# Patient Record
Sex: Female | Born: 1940 | Race: Black or African American | Hispanic: No | Marital: Single | Smoking: Never smoker
Health system: Southern US, Community
[De-identification: ages and names within clinical notes are randomized; demographics above are authoritative.]

## PROBLEM LIST (undated history)

## (undated) DIAGNOSIS — M1990 Unspecified osteoarthritis, unspecified site: Secondary | ICD-10-CM

## (undated) DIAGNOSIS — I499 Cardiac arrhythmia, unspecified: Secondary | ICD-10-CM

## (undated) DIAGNOSIS — I10 Essential (primary) hypertension: Secondary | ICD-10-CM

## (undated) DIAGNOSIS — Z8679 Personal history of other diseases of the circulatory system: Secondary | ICD-10-CM

## (undated) DIAGNOSIS — K219 Gastro-esophageal reflux disease without esophagitis: Secondary | ICD-10-CM

## (undated) HISTORY — PX: APPENDECTOMY: SHX54

## (undated) HISTORY — PX: CYST REMOVAL NECK: SHX6281

## (undated) HISTORY — PX: ABDOMINAL HYSTERECTOMY: SHX81

## (undated) HISTORY — PX: CHOLECYSTECTOMY: SHX55

## (undated) HISTORY — PX: CATARACT EXTRACTION W/ INTRAOCULAR LENS IMPLANT: SHX1309

## (undated) HISTORY — PX: BREAST SURGERY: SHX581

---

## 2019-03-23 ENCOUNTER — Encounter: Payer: Self-pay | Admitting: Cardiovascular Disease

## 2019-03-23 ENCOUNTER — Ambulatory Visit (INDEPENDENT_AMBULATORY_CARE_PROVIDER_SITE_OTHER): Payer: Medicare Other | Admitting: Cardiovascular Disease

## 2019-03-23 ENCOUNTER — Other Ambulatory Visit: Payer: Self-pay

## 2019-03-23 VITALS — BP 170/84 | HR 61 | Ht 68.0 in | Wt 171.4 lb

## 2019-03-23 DIAGNOSIS — I10 Essential (primary) hypertension: Secondary | ICD-10-CM | POA: Diagnosis not present

## 2019-03-23 DIAGNOSIS — Z7901 Long term (current) use of anticoagulants: Secondary | ICD-10-CM

## 2019-03-23 DIAGNOSIS — I4892 Unspecified atrial flutter: Secondary | ICD-10-CM

## 2019-03-23 NOTE — Patient Instructions (Signed)
Medication Instructions:  The current medical regimen is effective;  continue present plan and medications as directed. Please refer to the Current Medication list given to you today. If you need a refill on your cardiac medications before your next appointment, please call your pharmacy.  Testing/Procedures: Echocardiogram - Your physician has requested that you have an echocardiogram. Echocardiography is a painless test that uses sound waves to create images of your heart. It provides your doctor with information about the size and shape of your heart and how well your heart's chambers and valves are working. This procedure takes approximately one hour. There are no restrictions for this procedure. This will be performed at our [facility] location - [address].  Your physician has recommended that you wear a [place] monitor. The Zio patch cardiac monitor continuously records heart rhythm data for up to 14 days, this is for patients being evaluated for multiple types heart rhythms. For the first 24 hours post application, please avoid getting the Zio monitor wet in the shower or by excessive sweating during exercise. After that, feel free to carry on with regular activities. Keep soaps and lotions away from the ZIO XT Patch.  This will be placed at our [facility] location - [address].       Follow-Up: You will need a follow up appointment in 1 months.  You may see No primary care provider on file. or one of the following Advanced Practice Providers on your designated Care Team: Almyra Deforest, [place] . Fabian Sharp, PA-C     At [facility], you and your health needs are our priority.  As part of our continuing mission to provide you with exceptional heart care, we have created designated Provider Care Teams.  These Care Teams include your primary Cardiologist (physician) and Advanced Practice Providers (APPs -  Physician Assistants and Nurse Practitioners) who  all work together to provide you with the care you need, when you need it.  Thank you for choosing [facility] at [facility]

## 2019-03-23 NOTE — Progress Notes (Signed)
Cardiology Office Note    Date:  03/25/2019   ID:  Mandy Castillo, DOB 07/11/1941, MRN [id number]  PCP:  System, Pcp Not In  Cardiologist:  Nicki Guadalajarahomas Brayon Bielefeld, MD   New cardiology evaluation referred through the courtesy of Dr. Ree KidaJack Summer in [place]  History of Present Illness:  Mandy Castillo is a 78 y.o. 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  She resides in [place] but also has been living in [place] with her female partner.  She remains fairly active.  Earlier in the week while in [place] she presented to her primary physician, Dr. Ree KidaJack Summer for her annual physical.  She had missed her physical 1 year ago.  At the time she admits that she had been walking just prior to the office visit but denied any awareness of any heart rate irregularity or shortness of breath or chest pain.  An ECG done in Dr. Levada SchillingSummers office raise the possibility of possible atrial flutter with 3-1 block although I am not certain if this was definitive with diminutive abnormalities noted on ECG.  However, she was started on Eliquis 5 mg twice a day as well as metoprolol tartrate 25 mg twice a day.  She returned to [place] since this is where her significant other lives that she presents to the office today for cardiology evaluation. Presently she feels well.  She is unaware of any palpitations.  She was noted to have hypertension emergency room in [place] earlier in the week and she is now on metoprolol tartrate 25 mg twice a day in addition to Eliquis 5 mg twice a day.  Laboratory from her evaluation on March 20, 2019 showed a BUN of 10 creatinine 0.85, potassium 4.6.  She had normal LFTs.  TSH was 3.36 and free T4 1.35.  Vitamin B12 was 1524 vitamin D was 53.5.  Hemoglobin hematocrit were 13.2 and 41.0 respectively.  Lipid studies revealed a total cholesterol of 236 with significantly increased HDL cholesterol at 131, triglycerides of 49, and LDL cholesterol of 95.  She presents for  cardiology evaluation.  History reviewed. No pertinent past medical history.  Past medical history is notable for a a hysterectomy in 1936 and an appendectomy 1948.  Current Medications: Eliquis 5 mg twice a day and metoprolol succinate 25 mg daily  Allergies:   Patient has no allergy information on record.   Social History   Socioeconomic History   Marital status: Single    Spouse name: Not on file   Number of children: Not on file   Years of education: Not on file   Highest education level: Not on file  Occupational History   Not on file  Social Needs   Financial resource strain: Not on file   Food insecurity    Worry: Not on file    Inability: Not on file   Transportation needs    Medical: Not on file    Non-medical: Not on file  Tobacco Use   Smoking status: Never Smoker   Smokeless tobacco: Never Used  Substance and Sexual Activity   Alcohol use: Not on file   Drug use: Not on file   Sexual activity: Not on file  Lifestyle   Physical activity    Days per week: Not on file    Minutes per session: Not on file   Stress: Not on file  Relationships   Social connections    Talks on phone: Not on file  Gets together: Not on file    Attends religious service: Not on file    Active member of club or organization: Not on file    Attends meetings of clubs or organizations: Not on file    Relationship status: Not on file  Other Topics Concern   Not on file  Social History Narrative   Not on file    Socially she is retired from [employer].  She reached the rank of clinical.  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  She also attended classes at [employer] while in the [employer].  There is no tobacco use.  She drinks 2 glasses of wine per week.  She exercises regularly and does calisthenics on a daily basis  Family History: Her parents are deceased.  Her mother died at age 48 and  had a history of a CVA.  Her father died at age 17 and had prostate cancer as well as lung cancer.   ROS General: Negative; No fevers, chills, or night sweats;  HEENT: Negative; No changes in vision or hearing, sinus congestion, difficulty swallowing Pulmonary: Negative; No cough, wheezing, shortness of breath, hemoptysis Cardiovascular: Negative; No chest pain, presyncope, syncope, palpitations GI: Negative; No nausea, vomiting, diarrhea, or abdominal pain GU: Negative; No dysuria, hematuria, or difficulty voiding Musculoskeletal: Negative; no myalgias, joint pain, or weakness Hematologic/Oncology: Negative; no easy bruising, bleeding Endocrine: Negative; no heat/cold intolerance; no diabetes Neuro: Negative; no changes in balance, headaches Skin: Negative; No rashes or skin lesions Psychiatric: Negative; No behavioral problems, depression Sleep: Negative; No snoring, daytime sleepiness, hypersomnolence, bruxism, restless legs, hypnogognic hallucinations, no cataplexy Other comprehensive 14 point system review is negative.   PHYSICAL EXAM:   VS:  BP (!) 170/84    Pulse 61    Ht 5\' 8"  (1.727 m)    Wt 171 lb 6.4 oz (77.7 kg)    SpO2 100%    BMI 26.06 kg/m     Repeat blood pressure by me was significantly improved at 136/80 supine and 132/80 standing.  Wt Readings from Last 3 Encounters:  03/23/19 171 lb 6.4 oz (77.7 kg)    General: Alert, oriented, no distress.  Skin: normal turgor, no rashes, warm and dry HEENT: Normocephalic, atraumatic. Pupils equal round and reactive to light; sclera anicteric; extraocular muscles intact; Fundi without hemorrhages or exudates.  Discs flat Nose without nasal septal hypertrophy Mouth/Parynx benign; Mallinpatti scale 2 Neck: No JVD, no carotid bruits; normal carotid upstroke Lungs: clear to ausculatation and percussion; no wheezing or rales Chest wall: without tenderness to palpitation Heart: PMI not displaced, RRR, s1 s2 normal, 1/6 systolic  murmur, no diastolic murmur, no rubs, gallops, thrills, or heaves Abdomen: soft, nontender; no hepatosplenomehaly, BS+; abdominal aorta nontender and not dilated by palpation. Back: no CVA tenderness Pulses 2+ Musculoskeletal: full range of motion, normal strength, no joint deformities Extremities: no clubbing cyanosis or edema, Homan's sign negative  Neurologic: grossly nonfocal; Cranial nerves grossly wnl Psychologic: Normal mood and affect   Studies/Labs Reviewed:   EKG:  EKG is ordered today.  ECG (independently read by me): Normal sinus rhythm with an isolated PVC.  Ventricular rate 61 bpm.  PR interval 164 ms, QTc interval 418 ms.  Recent Labs: I reviewed the laboratory from Dr. Corinna Lines at [address] [place] [address].  Phone [phone], fax number [phone]  Glucose 78, potassium 4.6.  BUN 10 creatinine 0.85.  LFTs  normal.  TSH 3.36, free T4 1.35, B12 1524, hemoglobin 13.2, hematocrit 41.0.  Platelets 304 thousand.  Total cholesterol 236, triglycerides 49, HDL 131, LDL 95  No flowsheet data found.   No flowsheet data found.  No flowsheet data found. No results found for: MCV No results found for: TSH No results found for: HGBA1C   BNP No results found for: BNP  ProBNP No results found for: PROBNP   Lipid Panel  No results found for: CHOL, TRIG, HDL, CHOLHDL, VLDL, LDLCALC, LDLDIRECT   RADIOLOGY: No results found.   Additional studies/ records that were reviewed today include:  I reviewed the recent evaluation with Dr. Otelia Santee, MD from [place]  ASSESSMENT:    1. Atrial flutter, unspecified type (HCC)   2. Essential hypertension   3. Anticoagulated      PLAN:  Ms. Mandy Castillo is a young appearing 78 year old African-American female who is retired Midwife in the [employer].  She currently has been residing predominantly in [place] but has a significant other in [place] and has been commuting back and forth.  She  recently presented for routine annual exam and there was concern for possible atrial flutter flutter with 3 1 block.  I reviewed the ECGs.  The P waves are diminutive and in several leads it appears that the patient is in sinus rhythm but particularly in lead V4 there is a suggestion of a possible additional P wave on the downslope of T wave.  However, presently the patient is in definite sinus rhythm after initiating metoprolol extended release 25 mg.  She is completely asymptomatic and was unaware of any abnormality.  She was initially started on Eliquis.  At present I will continue this therapy but will schedule her for a 2-week monitor for further evaluation of her cardiac rhythm.  I am scheduling her for 2D echo Doppler study.  She denies any episodes of chest pain or shortness of breath.  Although her blood pressure several days ago was elevated on repeat by me was improved and minimally increased based on most recent hypertensive guidelines with stage I hypertension commences at 130/80.  I will see her in follow-up evaluation of the above studies and further recommendations will be made at that time.   Medication Adjustments/Labs and Tests Ordered: Current medicines are reviewed at length with the patient today.  Concerns regarding medicines are outlined above.  Medication changes, Labs and Tests ordered today are listed in the Patient Instructions below. Patient Instructions  Medication Instructions:  The current medical regimen is effective;  continue present plan and medications as directed. Please refer to the Current Medication list given to you today. If you need a refill on your cardiac medications before your next appointment, please call your pharmacy.  Testing/Procedures: Echocardiogram - Your physician has requested that you have an echocardiogram. Echocardiography is a painless test that uses sound waves to create images of your heart. It provides your doctor with information about the  size and shape of your heart and how well your hearts chambers and valves are working. This procedure takes approximately one hour. There are no restrictions for this procedure. This will be performed at our [facility] location - [address], [address].  Your physician has recommended that you wear a 14 DAY DAY ZIO-PATCH monitor. The Zio patch cardiac monitor continuously records heart rhythm data for up to 14 days, this is for patients being evaluated for multiple types heart rhythms. For the first  24 hours post application, please avoid getting the Zio monitor wet in the shower or by excessive sweating during exercise. After that, feel free to carry on with regular activities. Keep soaps and lotions away from the ZIO XT Patch.  This will be placed at our [facility] location - [address].       Follow-Up: You will need a follow up appointment in 1 months.  You may see No primary care provider on file. or one of the following Advanced Practice Providers on your designated Care Team: [place]e CourseHao Meng, [place]PA-C  Micah FlesherAngela Duke, PA-C     At [facility], you and your health needs are our priority.  As part of our continuing mission to provide you with exceptional heart care, we have created designated Provider Care Teams.  These Care Teams include your primary Cardiologist (physician) and Advanced Practice Providers (APPs -  Physician Assistants and Nurse Practitioners) who all work together to provide you with the care you need, when you need it.  Thank you for choosing [facility] at [facility]        Signed, Nicki Guadalajarahomas Aaiden Depoy, MD  03/25/2019 10:06 PM    [facility] [address], [address], [place], [place]  [id number] Phone: [phone]

## 2019-03-25 ENCOUNTER — Encounter: Payer: Self-pay | Admitting: Cardiovascular Disease

## 2019-03-26 ENCOUNTER — Telehealth: Payer: Self-pay | Admitting: *Deleted

## 2019-03-26 NOTE — Telephone Encounter (Signed)
14 ZIO AT long term live telemetry monitor to be mailed to patients home.  Instructins reviewed briefly as they are included in the monitor kit.

## 2019-03-28 ENCOUNTER — Ambulatory Visit (HOSPITAL_COMMUNITY): Payer: Medicare Other | Attending: Cardiovascular Disease

## 2019-03-28 ENCOUNTER — Other Ambulatory Visit: Payer: Self-pay

## 2019-03-28 DIAGNOSIS — I4892 Unspecified atrial flutter: Secondary | ICD-10-CM | POA: Diagnosis not present

## 2019-03-29 ENCOUNTER — Ambulatory Visit (INDEPENDENT_AMBULATORY_CARE_PROVIDER_SITE_OTHER): Payer: Medicare Other

## 2019-03-29 DIAGNOSIS — I4892 Unspecified atrial flutter: Secondary | ICD-10-CM

## 2019-04-04 ENCOUNTER — Telehealth: Payer: Self-pay | Admitting: Cardiovascular Disease

## 2019-04-04 NOTE — Telephone Encounter (Signed)
New Message    Courtney Paris is calling to advise that the patient has gotten there welcome call and they received the monitor as well. But it has been 72 hours and the device is not activated.

## 2019-04-04 NOTE — Telephone Encounter (Signed)
LEFT MESSAGE TO CALL BACK - WANTING TO KNOW IF PATIENT HAS  RECEIVED MONITOR  AND HAS NOT ACTIVATED THE SERVICE, YET

## 2019-04-06 NOTE — Telephone Encounter (Signed)
Spoke to pt about Zio monitor and educated about how to tell whether it was turned on correctly and to make sure that she see's no orange flashing lights currently. Pt then explained she had placed the monitor on  correctly and see's no flashing lights. Informed the pt that we were going to call the [facility] and discuss further. Reached out to [facility] and received instructions as to how help the client. Relayed these instructions to client. The pt's monitor is now connected. Patient confirmed the green light.

## 2019-04-06 NOTE — Telephone Encounter (Signed)
Follow up  ° ° °Patient is returning call.  °

## 2019-04-19 ENCOUNTER — Other Ambulatory Visit: Payer: Self-pay

## 2019-04-23 ENCOUNTER — Ambulatory Visit (INDEPENDENT_AMBULATORY_CARE_PROVIDER_SITE_OTHER): Payer: Medicare Other | Admitting: Cardiovascular Disease

## 2019-04-23 ENCOUNTER — Other Ambulatory Visit: Payer: Self-pay

## 2019-04-23 VITALS — BP 134/80 | HR 78 | Ht 68.0 in | Wt 170.0 lb

## 2019-04-23 DIAGNOSIS — Z7901 Long term (current) use of anticoagulants: Secondary | ICD-10-CM | POA: Diagnosis not present

## 2019-04-23 DIAGNOSIS — I10 Essential (primary) hypertension: Secondary | ICD-10-CM

## 2019-04-23 DIAGNOSIS — I471 Supraventricular tachycardia: Secondary | ICD-10-CM | POA: Diagnosis not present

## 2019-04-23 DIAGNOSIS — I4892 Unspecified atrial flutter: Secondary | ICD-10-CM

## 2019-04-23 MED ORDER — METOPROLOL SUCCINATE ER 50 MG PO TB24: 50.0000 mg | ORAL_TABLET | Freq: Every day | ORAL | 3 refills | Status: DC

## 2019-04-23 MED ORDER — ASPIRIN EC 81 MG PO TBEC: 81.0000 mg | DELAYED_RELEASE_TABLET | Freq: Every day | ORAL | 3 refills | Status: DC

## 2019-04-23 NOTE — Progress Notes (Signed)
Cardiology Office Note    Date:  04/29/2019   ID:  Mandy Castillo, DOB Nov 18, 1940, MRN [id number]  PCP:  System, Pcp Not In  Cardiologist:  Mandy Guadalajara, MD   F/U cardiology evaluation initally referred through the courtesy of Dr.Jack Castillo in [place]  History of Present Illness:  Mandy Castillo is a 78 y.o. female who presents for a 4-week follow-up cardiology evaluation.  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  She resides in [place] but also has been living in [place] with her female partner.  She remains fairly active.  Earlier in the week while in [place] she presented to her primary physician, Dr. Ree Kida Castillo for her annual physical.  She had missed her physical 1 year ago.  At the time she admits that she had been walking just prior to the office visit but denied any awareness of any heart rate irregularity or shortness of breath or chest pain.  An ECG done in Dr. Levada Castillo office raise the possibility of possible atrial flutter with 3-1 block although I am not certain if this was definitive with diminutive abnormalities noted on ECG.  However, she was started on Eliquis 5 mg twice a day as well as metoprolol tartrate 25 mg twice a day.  She returned to [place] since this is where her significant other lives and I saw her for initial cardiology evaluation on March 23, 2019.    When I saw her, she felt well.  She was unaware of any palpitations. She was noted to have hypertension emergency room in [place] earlier in the week and she was now on metoprolol tartrate 25 mg twice a day in addition to Eliquis 5 mg twice a day.  Laboratory from her evaluation on March 20, 2019 showed a BUN of 10 creatinine 0.85, potassium 4.6.  She had normal LFTs.  TSH was 3.36 and free T4 1.35.  Vitamin B12 was 1524 vitamin D was 53.5.  Hemoglobin hematocrit were 13.2 and 41.0 respectively.  Lipid studies revealed a total cholesterol of 236 with significantly increased  HDL cholesterol at 131, triglycerides of 49, and LDL cholesterol of 95.    During her evaluation I reviewed the ECG from [place] where the P waves were exceedingly diminutive and in several leads it appeared that the patient was in sinus rhythm with the exception of a possible abnormality in V4 suggesting an additional P wave on the downslope of the T wave.  Her ECG during her evaluation with me showed sinus rhythm.  She remained completely asymptomatic.  I recommended that she undergo a 2D echo Doppler study which was done on March 28, 2019.  This showed excellent systolic function with EF of 60 to 65%.  There was mild grade 1 diastolic dysfunction.  The left atrium and right atrium were normal in size.  She had mild MR.  A trivial pericardial effusion was identified.  She subsequently wore a 2-week ZIO patch monitor.  This revealed predominant sinus rhythm with an average rate at 67 bpm.  She had very short-lived episodes of SVT with the fastest being 7 beats with a rate of 160 bpm.  There were no episodes of atrial fibrillation or atrial flutter.  There were isolated PACs, and PVCs.  No high-grade ectopy was demonstrated.  Presently she continues to feel well and remains asymptomatic.  She presents for reevaluation.   Past medical history is notable for a a hysterectomy in 1936 and an  appendectomy 1948.  Current Medications: Eliquis 5 mg twice a day and metoprolol succinate 25 mg daily  Allergies:   Patient has no allergy information on record.   Social History   Socioeconomic History  . Marital status: Single    Spouse name: Not on file  . Number of children: Not on file  . Years of education: Not on file  . Highest education level: Not on file  Occupational History  . Not on file  Social Needs  . Financial resource strain: Not on file  . Food insecurity    Worry: Not on file    Inability: Not on file  . Transportation needs    Medical: Not on file    Non-medical: Not on file   Tobacco Use  . Smoking status: Never Smoker  . Smokeless tobacco: Never Used  Substance and Sexual Activity  . Alcohol use: Not on file  . Drug use: Not on file  . Sexual activity: Not on file  Lifestyle  . Physical activity    Days per week: Not on file    Minutes per session: Not on file  . Stress: Not on file  Relationships  . Social Herbalist on phone: Not on file    Gets together: Not on file    Attends religious service: Not on file    Active member of club or organization: Not on file    Attends meetings of clubs or organizations: Not on file    Relationship status: Not on file  Other Topics Concern  . Not on file  Social History Narrative  . Not on file    Socially she is retired from [employer].  She reached the rank of clinical.  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  She also attended classes at [employer] while in the [employer].  There is no tobacco use.  She drinks 2 glasses of wine per week.  She exercises regularly and does calisthenics on a daily basis  Family History: Her parents are deceased.  Her mother died at age 54 and had a history of a CVA.  Her father died at age 53 and had prostate cancer as well as lung cancer.   ROS General: Negative; No fevers, chills, or night sweats;  HEENT: Negative; No changes in vision or hearing, sinus congestion, difficulty swallowing Pulmonary: Negative; No cough, wheezing, shortness of breath, hemoptysis Cardiovascular: Negative; No chest pain, presyncope, syncope, palpitations GI: Negative; No nausea, vomiting, diarrhea, or abdominal pain GU: Negative; No dysuria, hematuria, or difficulty voiding Musculoskeletal: Negative; no myalgias, joint pain, or weakness Hematologic/Oncology: Negative; no easy bruising, bleeding Endocrine: Negative; no heat/cold intolerance; no diabetes Neuro: Negative; no changes in balance, headaches Skin:  Negative; No rashes or skin lesions Psychiatric: Negative; No behavioral problems, depression Sleep: Negative; No snoring, daytime sleepiness, hypersomnolence, bruxism, restless legs, hypnogognic hallucinations, no cataplexy Other comprehensive 14 point system review is negative.   PHYSICAL EXAM:   VS:  BP 134/80   Pulse 78   Ht 5\' 8"  (1.727 m)   Wt 170 lb (77.1 kg)   BMI 25.85 kg/m     Repeat blood pressure by me was 140/80  Wt Readings from Last 3 Encounters:  04/23/19 170 lb (77.1 kg)  03/23/19 171 lb 6.4 oz (77.7 kg)    General: Alert, oriented, no distress.  Skin: normal turgor, no rashes, warm and dry HEENT: Normocephalic, atraumatic.  Pupils equal round and reactive to light; sclera anicteric; extraocular muscles intact; Nose without nasal septal hypertrophy Mouth/Parynx benign; Mallinpatti scale 2 Neck: No JVD, no carotid bruits; normal carotid upstroke Lungs: clear to ausculatation and percussion; no wheezing or rales Chest wall: without tenderness to palpitation Heart: PMI not displaced, RRR, s1 s2 normal, 1/6 systolic murmur, no diastolic murmur, no rubs, gallops, thrills, or heaves Abdomen: soft, nontender; no hepatosplenomehaly, BS+; abdominal aorta nontender and not dilated by palpation. Back: no CVA tenderness Pulses 2+ Musculoskeletal: full range of motion, normal strength, no joint deformities Extremities: no clubbing cyanosis or edema, Homan's sign negative  Neurologic: grossly nonfocal; Cranial nerves grossly wnl Psychologic: Normal mood and affect    Studies/Labs Reviewed:   EKG:  EKG is ordered today.  NSR at 78;no ectopy, normal intervals.  August 28, 2020ECG (independently read by me): Normal sinus rhythm with an isolated PVC.  Ventricular rate 61 bpm.  PR interval 164 ms, QTc interval 418 ms.  Recent Labs: I reviewed the laboratory from Dr. Vaughan Basta at [address] in [place], [place] [address].  Phone #[phone], fax number [phone]   Glucose 78, potassium 4.6.  BUN 10 creatinine 0.85.  LFTs normal.  TSH 3.36, free T4 1.35, B12 1524, hemoglobin 13.2, hematocrit 41.0.  Platelets 304 thousand.  Total cholesterol 236, triglycerides 49, HDL 131, LDL 95  No flowsheet data found.   No flowsheet data found.  No flowsheet data found. No results found for: MCV No results found for: TSH No results found for: HGBA1C   BNP No results found for: BNP  ProBNP No results found for: PROBNP   Lipid Panel  No results found for: CHOL, TRIG, HDL, CHOLHDL, VLDL, LDLCALC, LDLDIRECT   RADIOLOGY: No results found.   Additional studies/ records that were reviewed today include:  I reviewed the recent evaluation with Dr. Otelia Santee, MD from [facility] [place]  ECHO 03/28/2019 IMPRESSIONS  1. The left ventricle has normal systolic function with an ejection fraction of 60-65%. The cavity size was normal. There is mildly increased left ventricular wall thickness. Left ventricular diastolic Doppler parameters are consistent with impaired  relaxation. No evidence of left ventricular regional wall motion abnormalities.  2. The right ventricle has normal systolic function. The cavity was normal. There is no increase in right ventricular wall thickness.  3. The pericardial effusion is circumferential.  4. Trivial pericardial effusion is present.  5. The mitral valve is grossly normal. Mild thickening of the mitral valve leaflet.  6. The tricuspid valve is grossly normal.  7. The aortic valve is tricuspid. Mild thickening of the aortic valve. No stenosis of the aortic valve.  8. The aorta is normal unless otherwise noted.  9. The aortic root is normal in size and structure. 10. When compared to the prior study: No prior study.  ------------------------------------------------------------------------------------------------------------- ZIO MONITOR The patient was monitored with a Zio patch from March 29, 2019 through April 12, 2019.   The predominant heart rhythm was sinus rhythm with an average rate at 67 bpm.  The slowest heart rate was 48 bpm which occurred on September 13 at 5:47 AM.  The fastest heart rate was 160 bpm which occurred at 12:01 AM on September 6.  The patient had several short runs of SVT with the fastest interval lasting 7 beats with a maximum rate at 160 bpm.  The longest interval was 25.1 seconds with an average rate at 97 bpm.  There were rare isolated PACs, rare atrial couplets.  There was no episode of atrial fibrillation or atrial flutter.  Isolated PVCs were very rare at less than 1%.  There were very rare episodes of ventricular bigeminy and trigeminy.  ASSESSMENT:    1. Essential hypertension   2. Paroxysmal SVT (supraventricular tachycardia) (HCC)   3. 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  She currently has been residing predominantly in [place] but has a significant other in [place] and has been commuting back and forth.  She recently presented for routine annual exam with her primary physician in [place] and there was concern for possible atrial flutter flutter with 3 1 block.  I reviewed the ECGs.  The P waves are diminutive and in several leads it appears that the patient was in sinus rhythm but  in lead V4 there was a suggestion of a possible additional P wave on the downslope of T wave.  She was started on Eliquis and metoprolol 25 mg.  When I saw her for initial evaluation she was definitely in sinus rhythm.  She was asymptomatic at the time of her initial assessment in [place].  Since I last saw her, she has felt well.  She is unaware of any significant rhythm issues.  I reviewed her echo Doppler study which confirms normal systolic function with mild grade 1 diastolic dysfunction.  Both right and left atrium were of normal dimensions.  I reviewed her ZIO patch  monitor which showed predominant sinus rhythm averaging at 67 bpm.  However, they were several short bursts of possible SVT with a maximum of 7 beats at a rate of 160 bpm.  The longest duration was 25 seconds at a rate of 97.  She had rare ectopy.  I had a long discussion with her today.  I am not totally convinced that she had developed atrial flutter.  Presently, I have recommended she discontinue Eliquis and initiate baby aspirin 81 mg.  I am increasing her metoprolol succinate from her present dose of 25 mg in the next week she will increase this to 37.5 mg.  1 week later she will further titrate this to 50 mg daily.  She will monitor her resting pulse and blood pressure.  I will see her in 3 to 4 months for follow-up evaluation.  If she becomes aware of any recurrent palpitation she is to contact our office for a sooner evaluation.   Medication Adjustments/Labs and Tests Ordered: Current medicines are reviewed at length with the patient today.  Concerns regarding medicines are outlined above.  Medication changes, Labs and Tests ordered today are listed in the Patient Instructions below. Patient Instructions  Medication Instructions:   CHANGES TO   METOPROLOL SUCCINATE  37.5 MG  -  FOR ONE WEEK - TABLET  1 AND 1/2  DAILY ---  IF TOLERATE  INCREASE  TO 50 MG  ( 2 TABLET  OF 25 MG)   - STOP TAKING ELIQUIS  START ASPIRIN 81 MG  - ENTERIC COATED   DAILY    If you need a refill on your cardiac medications before your next appointment, please call your pharmacy.   Lab work: NOT NEEDED   Testing/Procedures: NOT NEEDED  Follow-Up: At [facility], you and your health needs are our priority.  As part of our continuing mission to provide you with exceptional heart care, we have created designated Provider Care Teams.  These Care Teams include your primary Cardiologist (physician) and Advanced Practice Providers (APPs -  Physician Assistants and Nurse Practitioners) who all work together to  provide you with the care you need, when you need it. . Your physician recommends that you schedule a follow-up appointment in 3 TO 4 MONTHS  .   Any Other Special Instructions Will Be Listed Below (If Applicable).      Signed, Mandy Guadalajara, MD  04/29/2019 10:22 AM    [facility] [address], [address], [place], [place]  [address] Phone: [phone]

## 2019-04-23 NOTE — Patient Instructions (Addendum)
Medication Instructions:   CHANGES TO   METOPROLOL SUCCINATE  37.5 MG  -  FOR ONE WEEK - TABLET  1 AND 1/2  DAILY ---  IF TOLERATE  INCREASE  TO 50 MG  ( 2 TABLET  OF 25 MG)   - STOP TAKING ELIQUIS  START ASPIRIN 81 MG  - ENTERIC COATED   DAILY    If you need a refill on your cardiac medications before your next appointment, please call your pharmacy.   Lab work: NOT NEEDED   Testing/Procedures: NOT NEEDED  Follow-Up: At [facility], you and your health needs are our priority.  As part of our continuing mission to provide you with exceptional heart care, we have created designated Provider Care Teams.  These Care Teams include your primary Cardiologist (physician) and Advanced Practice Providers (APPs -  Physician Assistants and Nurse Practitioners) who all work together to provide you with the care you need, when you need it. . Your physician recommends that you schedule a follow-up appointment in 3 TO 4 MONTHS  .   Any Other Special Instructions Will Be Listed Below (If Applicable).

## 2019-04-25 ENCOUNTER — Other Ambulatory Visit (INDEPENDENT_AMBULATORY_CARE_PROVIDER_SITE_OTHER): Payer: Medicare Other

## 2019-04-25 DIAGNOSIS — I10 Essential (primary) hypertension: Secondary | ICD-10-CM

## 2019-04-25 DIAGNOSIS — I4892 Unspecified atrial flutter: Secondary | ICD-10-CM

## 2019-04-25 DIAGNOSIS — Z7901 Long term (current) use of anticoagulants: Secondary | ICD-10-CM

## 2019-04-29 ENCOUNTER — Encounter: Payer: Self-pay | Admitting: Cardiovascular Disease

## 2019-09-21 ENCOUNTER — Other Ambulatory Visit: Payer: Self-pay | Admitting: Physician Assistant

## 2019-09-21 ENCOUNTER — Telehealth (INDEPENDENT_AMBULATORY_CARE_PROVIDER_SITE_OTHER): Payer: Medicare Other | Admitting: Physician Assistant

## 2019-09-21 ENCOUNTER — Encounter: Payer: Self-pay | Admitting: Physician Assistant

## 2019-09-21 VITALS — BP 138/78 | HR 70 | Ht 68.0 in | Wt 171.0 lb

## 2019-09-21 DIAGNOSIS — I471 Supraventricular tachycardia: Secondary | ICD-10-CM | POA: Diagnosis not present

## 2019-09-21 MED ORDER — METOPROLOL SUCCINATE ER 50 MG PO TB24: 50.0000 mg | ORAL_TABLET | Freq: Every day | ORAL | 3 refills | Status: DC

## 2019-09-21 NOTE — Progress Notes (Signed)
Virtual Visit via Telephone Note   This visit type was conducted due to national recommendations for restrictions regarding the COVID-19 Pandemic (e.g. social distancing) in an effort to limit this patient's exposure and mitigate transmission in our community.  Due to her co-morbid illnesses, this patient is at least at moderate risk for complications without adequate follow up.  This format is felt to be most appropriate for this patient at this time.  The patient did not have access to video technology/had technical difficulties with video requiring transitioning to audio format only (telephone).  All issues noted in this document were discussed and addressed.  No physical exam could be performed with this format.  Please refer to the patient's chart for her  consent to telehealth for [facility].   Date:  09/23/2019   ID:  Mandy Castillo, DOB Mar 11, 1941, MRN [id number]  Patient Location: Home Provider Location: Office  PCP:  System, Pcp Not In  Cardiologist:  Shelva Majestic, MD  Electrophysiologist:  None   Evaluation Performed:  Follow-Up Visit  Chief Complaint:  followup  History of Present Illness:    Mandy Castillo is a 79 y.o. 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  There was a EKG that was obtained by his primary care provider that showed possibility of atrial flutter with 3-1 AV block.  She was started on Eliquis 5 mg twice daily as well as metoprolol.  Since she resides in [place], her primary care provider from [place] referred her to Dr. Claiborne Billings for initial evaluation in August 2020.  Dr. Claiborne Billings has reviewed her EKG from [place], he felt the P wave was present however exceedingly diminutive making it hard to interpret.  He suspect the patient was in sinus rhythm.  Repeat EKG continue to show sinus rhythm.  Echocardiogram performed in September 2020 showed EF 60 to 65%, grade 1 DD, normal left and the right atrium size, mild MR, trivial pericardial effusion.  She  subsequently wore a 2-week Zio patch monitor that showed predominantly sinus rhythm with average heart rate of 67.  She only had a short-lived episode of SVT with the fastest being 7 beats with a heart rate of 160 bpm.  There was no atrial fibrillation, but there was PACs and PVCs.  During the last office visit on 04/15/2019, Dr. Claiborne Billings increased metoprolol succinate to 37.5 mg for 1 week with instruction to increase it further to 50 mg if she is able to tolerate.  Eliquis has been discontinued and she was started on 81 mg aspirin.  She is doing quite well from cardiology perspective.  She denies any recent exertional chest pain or shortness of breath.  She continues to be quite active at home.  She received her first [place] vaccine on February 11, her next dose of Pfizer vaccine is due on March 8th.  Overall she is doing quite well from cardiology perspective and denies any lower extremity edema, orthopnea or PND.  I would recommend continuing her current therapy and she can follow-up with Dr. Claiborne Billings in 1 year.  The patient does not have symptoms concerning for COVID-19 infection (fever, chills, cough, or new shortness of breath).    No past medical history on file. No past surgical history on file.   Current Meds  Medication Sig  . aspirin EC 81 MG tablet Take 1 tablet (81 mg total) by mouth daily.     Allergies:   Patient has no known allergies.   Social History  Tobacco Use  . Smoking status: Never Smoker  . Smokeless tobacco: Never Used  Substance Use Topics  . Alcohol use: Not on file  . Drug use: Not on file     Family Hx: The patient's family history is not on file.  ROS:   Please see the history of present illness.     All other systems reviewed and are negative.   Prior CV studies:   The following studies were reviewed today:  Echo 03/28/2019 IMPRESSIONS    1. The left ventricle has normal systolic function with an ejection  fraction of 60-65%. The cavity size was  normal. There is mildly increased  left ventricular wall thickness. Left ventricular diastolic Doppler  parameters are consistent with impaired  relaxation. No evidence of left ventricular regional wall motion  abnormalities.  2. The right ventricle has normal systolic function. The cavity was  normal. There is no increase in right ventricular wall thickness.  3. The pericardial effusion is circumferential.  4. Trivial pericardial effusion is present.  5. The mitral valve is grossly normal. Mild thickening of the mitral  valve leaflet.  6. The tricuspid valve is grossly normal.  7. The aortic valve is tricuspid. Mild thickening of the aortic valve. No  stenosis of the aortic valve.  8. The aorta is normal unless otherwise noted.  9. The aortic root is normal in size and structure.  10. When compared to the prior study: No prior study.   Labs/Other Tests and Data Reviewed:    EKG:  An ECG dated 04/23/2019 was personally reviewed today and demonstrated:  Normal sinus rhythm without significant ST-T wave changes.  Recent Labs: No results found for requested labs within last 8760 hours.   Recent Lipid Panel No results found for: CHOL, TRIG, HDL, CHOLHDL, LDLCALC, LDLDIRECT  Wt Readings from Last 3 Encounters:  09/21/19 171 lb (77.6 kg)  04/23/19 170 lb (77.1 kg)  03/23/19 171 lb 6.4 oz (77.7 kg)     Objective:    Vital Signs:  BP 138/78   Pulse 70   Ht 5\' 8"  (1.727 m)   Wt 171 lb (77.6 kg)   BMI 26.00 kg/m    VITAL SIGNS:  reviewed  ASSESSMENT & PLAN:    1. SVT: Symptom well controlled on the current metoprolol.  She denies any recent palpitation.  She continued to be quite active and without any exertional symptoms.  I will continue on the current therapy.  COVID-19 Education: The signs and symptoms of COVID-19 were discussed with the patient and how to seek care for testing (follow up with PCP or arrange E-visit).  The importance of social distancing was  discussed today.  Time:   Today, I have spent 5 minutes with the patient with telehealth technology discussing the above problems.     Medication Adjustments/Labs and Tests Ordered: Current medicines are reviewed at length with the patient today.  Concerns regarding medicines are outlined above.   Tests Ordered: No orders of the defined types were placed in this encounter.   Medication Changes: Meds ordered this encounter  Medications  . metoprolol succinate (TOPROL-XL) 50 MG 24 hr tablet    Sig: Take 1 tablet (50 mg total) by mouth daily. Take with or immediately following a meal.    Dispense:  90 tablet    Refill:  3    Follow Up:  Either In Person or Virtual in 1 year(s)  , PA  09/23/2019 11:47 PM  [facility]

## 2019-09-21 NOTE — Patient Instructions (Signed)
Medication Instructions:  Your physician recommends that you continue on your current medications as directed. Please refer to the Current Medication list given to you today.  *If you need a refill on your cardiac medications before your next appointment, please call your pharmacy*   Follow-Up: At [facility], you and your health needs are our priority.  As part of our continuing mission to provide you with exceptional heart care, we have created designated Provider Care Teams.  These Care Teams include your primary Cardiologist (physician) and Advanced Practice Providers (APPs -  Physician Assistants and Nurse Practitioners) who all work together to provide you with the care you need, when you need it.  We recommend signing up for the patient portal called "MyChart".  Sign up information is provided on this After Visit Summary.  MyChart is used to connect with patients for Virtual Visits (Telemedicine).  Patients are able to view lab/test results, encounter notes, upcoming appointments, etc.  Non-urgent messages can be sent to your provider as well.   To learn more about what you can do with MyChart, go to [email or web address].    Your next appointment:   12 month(s)  The format for your next appointment:   In Person  Provider:   Thomas Kelly, MD   Other Instructions   

## 2020-01-30 DIAGNOSIS — Z961 Presence of intraocular lens: Secondary | ICD-10-CM | POA: Diagnosis not present

## 2020-01-30 DIAGNOSIS — H2512 Age-related nuclear cataract, left eye: Secondary | ICD-10-CM | POA: Diagnosis not present

## 2020-04-21 ENCOUNTER — Other Ambulatory Visit: Payer: Self-pay

## 2020-04-21 ENCOUNTER — Ambulatory Visit (INDEPENDENT_AMBULATORY_CARE_PROVIDER_SITE_OTHER): Payer: Medicare Other | Admitting: Family Medicine

## 2020-04-21 ENCOUNTER — Encounter: Payer: Self-pay | Admitting: Family Medicine

## 2020-04-21 VITALS — BP 140/80 | HR 61 | Temp 97.7°F | Ht 67.5 in | Wt 179.2 lb

## 2020-04-21 DIAGNOSIS — I4892 Unspecified atrial flutter: Secondary | ICD-10-CM | POA: Insufficient documentation

## 2020-04-21 DIAGNOSIS — R221 Localized swelling, mass and lump, neck: Secondary | ICD-10-CM | POA: Diagnosis not present

## 2020-04-21 DIAGNOSIS — I10 Essential (primary) hypertension: Secondary | ICD-10-CM | POA: Diagnosis not present

## 2020-04-21 NOTE — Assessment & Plan Note (Signed)
Cont metoprolol. Slightly high today, but reports normal at home. Check kidney function today.

## 2020-04-21 NOTE — Progress Notes (Signed)
   Subjective:     Mandy Castillo is a 79 y.o. female presenting for Establish Care and Referral (dermatologist and colonoscopy )     HPI   #HTN - takes metoprolol daily - checks bp at home -    Review of Systems   Social History   Tobacco Use  Smoking Status Never Smoker  Smokeless Tobacco Never Used        Objective:    BP Readings from Last 3 Encounters:  04/21/20 140/80  09/21/19 138/78  04/23/19 134/80   Wt Readings from Last 3 Encounters:  04/21/20 179 lb 4 oz (81.3 kg)  09/21/19 171 lb (77.6 kg)  04/23/19 170 lb (77.1 kg)    BP 140/80   Pulse 61   Temp 97.7 F (36.5 C) (Temporal)   Ht 5' 7.5" (1.715 m)   Wt 179 lb 4 oz (81.3 kg)   SpO2 98%   BMI 27.66 kg/m    Physical Exam Constitutional:      General: She is not in acute distress.    Appearance: She is well-developed. She is not diaphoretic.  HENT:     Right Ear: External ear normal.     Left Ear: External ear normal.     Nose: Nose normal.  Eyes:     Conjunctiva/sclera: Conjunctivae normal.  Cardiovascular:     Rate and Rhythm: Normal rate and regular rhythm.     Heart sounds: Murmur heard.   Pulmonary:     Effort: Pulmonary effort is normal. No respiratory distress.     Breath sounds: Normal breath sounds. No wheezing.  Musculoskeletal:     Cervical back: Neck supple.  Skin:    General: Skin is warm and dry.     Capillary Refill: Capillary refill takes less than 2 seconds.     Comments: Large raised mass on the posterior neck. Non-fluctuant, red or tender. Soft, non-mobile   Neurological:     Mental Status: She is alert. Mental status is at baseline.  Psychiatric:        Mood and Affect: Mood normal.        Behavior: Behavior normal.           Assessment & Plan:   Problem List Items Addressed This Visit      Cardiovascular and Mediastinum   Atrial flutter (HCC)    Cont metoprolol 50 mg. Appreciate cardiology support      Essential hypertension    Cont  metoprolol. Slightly high today, but reports normal at home. Check kidney function today.       Relevant Orders   Comprehensive metabolic panel     Other   Neck mass - Primary    Referral to dermatology for evaluation and treatment. ?Lipoma or cyst      Relevant Orders   Ambulatory referral to Dermatology       Return in about 6 months (around 10/19/2020) for Medicare wellness visit.  Lynnda Child, MD  This visit occurred during the SARS-CoV-2 public health emergency.  Safety protocols were in place, including screening questions prior to the visit, additional usage of staff PPE, and extensive cleaning of exam room while observing appropriate contact time as indicated for disinfecting solutions.

## 2020-04-21 NOTE — Patient Instructions (Signed)
#  Referral I have placed a referral to a specialist for you. You should receive a phone call from the specialty office. Make sure your voicemail is not full and that if you are able to answer your phone to unknown or new numbers.   It may take up to 2 weeks to hear about the referral. If you do not hear anything in 2 weeks, please call our office and ask to speak with the referral coordinator.    No need for any cancer screening based on age  Return for medicare wellness visit or sooner as needed

## 2020-04-21 NOTE — Assessment & Plan Note (Signed)
Cont metoprolol 50 mg. Appreciate cardiology support

## 2020-04-21 NOTE — Assessment & Plan Note (Signed)
Referral to dermatology for evaluation and treatment. ?Lipoma or cyst

## 2020-04-22 LAB — COMPREHENSIVE METABOLIC PANEL
ALT: 13 U/L (ref 0–35)
AST: 16 U/L (ref 0–37)
Albumin: 4.3 g/dL (ref 3.5–5.2)
Alkaline Phosphatase: 68 U/L (ref 39–117)
BUN: 15 mg/dL (ref 6–23)
CO2: 31 mEq/L (ref 19–32)
Calcium: 9.6 mg/dL (ref 8.4–10.5)
Chloride: 101 mEq/L (ref 96–112)
Creatinine, Ser: 0.86 mg/dL (ref 0.40–1.20)
GFR: 76.49 mL/min (ref >60.00)
Glucose, Bld: 82 mg/dL (ref 70–99)
Potassium: 4 mEq/L (ref 3.5–5.1)
Sodium: 138 mEq/L (ref 135–145)
Total Bilirubin: 0.4 mg/dL (ref 0.2–1.2)
Total Protein: 7.3 g/dL (ref 6.0–8.3)

## 2020-07-09 ENCOUNTER — Ambulatory Visit (INDEPENDENT_AMBULATORY_CARE_PROVIDER_SITE_OTHER): Payer: Medicare Other | Admitting: Family Medicine

## 2020-07-09 ENCOUNTER — Telehealth: Payer: Self-pay

## 2020-07-09 ENCOUNTER — Encounter: Payer: Self-pay | Admitting: Family Medicine

## 2020-07-09 ENCOUNTER — Other Ambulatory Visit: Payer: Self-pay

## 2020-07-09 VITALS — BP 150/72 | HR 80 | Temp 96.9°F | Ht 67.5 in | Wt 175.4 lb

## 2020-07-09 DIAGNOSIS — I10 Essential (primary) hypertension: Secondary | ICD-10-CM

## 2020-07-09 DIAGNOSIS — M542 Cervicalgia: Secondary | ICD-10-CM | POA: Insufficient documentation

## 2020-07-09 NOTE — Assessment & Plan Note (Signed)
BP rose significantly when out of town after forgetting her metoprolol for 3 days  Also neck pain on L (suspect not related as it is positional) Started back on metoprolol last night and bp is starting to come down BP: (!) 150/72    Pulse Rate: 80  Plan to continue metoprolol xl 50 mg daily with better compliance  Monitor at home over the weekend and call in Monday with update  If not back to baseline may need to inc or add tx

## 2020-07-09 NOTE — Telephone Encounter (Signed)
I spoke with Mandy Castillo; Mandy Castillo said she takes her metoprolol at night time; pts last BP on 07/08/20 was 166/90. Mandy Castillo's BP this morning is 176/107 Mandy Castillo does not have CP, SOB, H/A or dizziness. Mandy Castillo does have lt neck pain for couple of days but Mandy Castillo thinks she slept wrong; neck pain is better today and hurts when she moves her neck. Mandy Castillo scheduled in office appt with Dr Milinda Antis 07/09/20 at 10:30 since BP is elevated; could be due to missing 2 days of meds but to be safe Mandy Castillo wants to be seen. No covid. UC & ED precautions given and Mandy Castillo voiced understanding.

## 2020-07-09 NOTE — Patient Instructions (Signed)
Continue your current medicine   Watch blood pressure over the weekend  Check when you are relaxed  Let us know Monday how readings are and we will see if we need to change or add or advance medication   Use some heat on your neck  Use a good pillow  Gently stretch your neck   If symptoms return or worsen please let me know

## 2020-07-09 NOTE — Telephone Encounter (Signed)
[facility] Day - Client TELEPHONE ADVICE RECORD [facility] Patient Name: Mandy Castillo Gender: Female DOB: 1941-07-13 Age: 79 Y 11 M 5 D Return Phone Number: [phone] (Primary), [phone] (Secondary) Address: City/State/Zip: [facility] [place] [address] Client [facility] Day - Client Client Site  [facility] [place] - Day Physician Gweneth Dimitri- MD Contact Type Call Who Is Calling Patient / Member / Family / Caregiver Call Type Triage / Clinical Relationship To Patient Self Return Phone Number [phone] (Primary) Chief Complaint Blood Pressure High Reason for Call Symptomatic / Request for Health Information Initial Comment Caller states she forgot to take her medication and her reading is high. It is 199/700. Translation No Nurse Assessment Nurse: Rinaldo Ratel, RN, [place] Date/Time Lamount Cohen Time): 07/08/2020 5:14:10 PM Confirm and document reason for call. If symptomatic, describe symptoms. ---Caller states she missed 2 days of taking her metoprolol due to her traveling. Pt states she has not had her dose today. Pt reports BP is 166/90. Denies chest pain, palpitations, sob. Does the patient have any new or worsening symptoms? ---Yes Will a triage be completed? ---Yes Related visit to physician within the last 2 weeks? ---No Does the PT have any chronic conditions? (i.e. diabetes, asthma, this includes High risk factors for pregnancy, etc.) ---Yes List chronic conditions. ---HTN Is this a behavioral health or substance abuse call? ---No Guidelines Guideline Title Affirmed Question Affirmed Notes Nurse Date/Time (Eastern Time) Blood Pressure - High Systolic BP >= 160 OR Diastolic >= 100 Rinaldo Ratel, RN, [place] 07/08/2020 5:16:37 PM Disp. Time Lamount Cohen Time) Disposition Final User 07/08/2020 5:19:44 PM SEE PCP WITHIN 3 DAYS Yes Rinaldo Ratel, RN, [place] Caller Disagree/Comply Comply Caller Understands  Yes PreDisposition Call Doctor PLEASE NOTE: All timestamps contained within this report are represented as [place] Standard Time. CONFIDENTIALTY NOTICE: This fax transmission is intended only for the addressee. It contains information that is legally privileged, confidential or otherwise protected from use or disclosure. If you are not the intended recipient, you are strictly prohibited from reviewing, disclosing, copying using or disseminating any of this information or taking any action in reliance on or regarding this information. If you have received this fax in error, please notify us immediately by telephone so that we can arrange for its return to [place]. Phone: [phone], Toll-Free: [phone], Fax: [phone] Page: 2 of 2 Call Id: [id number] Care Advice Given Per Guideline SEE PCP WITHIN 3 DAYS: * You need to be seen within 2 or 3 days. CALL BACK IF: * Difficulty walking, difficulty talking, or severe headache occurs * Weakness or numbness of the face, arm or leg on one side of the body occurs * Chest pain or difficulty breathing occurs * Your blood pressure is over 180/110 * You become worse CARE ADVICE given per High Blood Pressure (Adult) guideline. Referrals REFERRED TO PCP OFFICE

## 2020-07-09 NOTE — Progress Notes (Signed)
Subjective:    Patient ID: Mandy Castillo, female    DOB: 08-03-1940, 79 y.o.   MRN: [id number]  This visit occurred during the SARS-CoV-2 public health emergency.  Safety protocols were in place, including screening questions prior to the visit, additional usage of staff PPE, and extensive cleaning of exam room while observing appropriate contact time as indicated for disinfecting solutions.    HPI 79 yo pt of Dr Selena Batten presents for BP concern  Wt Readings from Last 3 Encounters:  07/09/20 175 lb 7 oz (79.6 kg)  04/21/20 179 lb 4 oz (81.3 kg)  09/21/19 171 lb (77.6 kg)   27.07 kg/m   Recently traveled and stayed at a friend's house  Woke up with pain in her L shoulder (hurt to move it)  It radiates to neck Thought she slept wrong   Last night still sore  Took her bp and it was elevated   She missed 2 nights of metoprolol  Now back on -last dose was last night   Her bp cuff is for the arm   She drove back home     H/o HTN Takes metoprolol xl 50 mg daily  BP Readings from Last 3 Encounters:  07/09/20 (!) 150/72  04/21/20 140/80  09/21/19 138/78    Pulse Readings from Last 3 Encounters:  07/09/20 80  04/21/20 61  09/21/19 70   Feels fine today  Shoulder/neck hurts to reach and tilt neck  Had a neck fusion years ago and rom is limited anyway  Patient Active Problem List   Diagnosis Date Noted  . Neck pain 07/09/2020  . Atrial flutter (HCC) 04/21/2020  . Essential hypertension 04/21/2020  . Neck mass 04/21/2020  . Nuclear sclerosis, left 01/30/2020  . 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 No pertinent past medical history. Past Surgical History:  Procedure Laterality Date  . ABDOMINAL HYSTERECTOMY    . APPENDECTOMY    . BREAST SURGERY     cyst removed  . CHOLECYSTECTOMY     Social History   Tobacco Use  . Smoking status: Never Smoker  . Smokeless tobacco: Never Used  Substance Use Topics  . Alcohol use: Yes    Comment: wine  every few months  . Drug use: Never   Family History  Problem Relation Age of Onset  . Stroke Mother 18  . Prostate cancer Father    No Known Allergies Current Outpatient Medications on File Prior to Visit  Medication Sig Dispense Refill  . aspirin EC 81 MG tablet Take 1 tablet (81 mg total) by mouth daily. 90 tablet 3  . Cholecalciferol (D3 PO) Take by mouth daily.    . Cyanocobalamin (B-12 PO) Take by mouth daily.    . metoprolol succinate (TOPROL-XL) 50 MG 24 hr tablet Take 1 tablet (50 mg total) by mouth daily. Take with or immediately following a meal. 90 tablet 3   No current facility-administered medications on file prior to visit.    Review of Systems  Constitutional: Negative for activity change, appetite change, fatigue, fever and unexpected weight change.  HENT: Negative for congestion, ear pain, rhinorrhea, sinus pressure and sore throat.   Eyes: Negative for pain, redness and visual disturbance.  Respiratory: Negative for cough, shortness of breath and wheezing.   Cardiovascular: Negative for chest pain and palpitations.  Gastrointestinal: Negative for abdominal pain, blood in stool, constipation and diarrhea.  Endocrine: Negative for polydipsia and polyuria.  Genitourinary: Negative for dysuria, frequency and urgency.  Musculoskeletal: Positive for neck pain. Negative for arthralgias, back pain and myalgias.  Skin: Negative for pallor and rash.  Allergic/Immunologic: Negative for environmental allergies.  Neurological: Negative for dizziness, syncope and headaches.  Hematological: Negative for adenopathy. Does not bruise/bleed easily.  Psychiatric/Behavioral: Negative for decreased concentration and dysphoric mood. The patient is not nervous/anxious.        Objective:   Physical Exam Constitutional:      General: She is not in acute distress.    Appearance: Normal appearance. She is well-developed, normal weight and well-nourished. She is not ill-appearing or  diaphoretic.  HENT:     Head: Normocephalic and atraumatic.     Mouth/Throat:     Mouth: Oropharynx is clear and moist.  Eyes:     Extraocular Movements: EOM normal.     Conjunctiva/sclera: Conjunctivae normal.     Pupils: Pupils are equal, round, and reactive to light.  Neck:     Thyroid: No thyromegaly.     Vascular: No carotid bruit or JVD.  Cardiovascular:     Rate and Rhythm: Normal rate and regular rhythm.     Pulses: Intact distal pulses.     Heart sounds: Normal heart sounds. No gallop.   Pulmonary:     Effort: Pulmonary effort is normal. No respiratory distress.     Breath sounds: Normal breath sounds. No wheezing or rales.     Comments: No crackles Abdominal:     General: Bowel sounds are normal. There is no distension or abdominal bruit.     Palpations: Abdomen is soft. There is no mass.     Tenderness: There is no abdominal tenderness.  Musculoskeletal:        General: No edema.     Cervical back: Neck supple. Tenderness and crepitus present. No swelling, edema, deformity, erythema, torticollis or bony tenderness. Pain with movement present. Decreased range of motion.     Comments: Tender over L trapezius area  Limited rom of neck  Worse pain with R rotation and L tilt   Nl rom shoulders and no tenderness  Lymphadenopathy:     Cervical: No cervical adenopathy.  Skin:    General: Skin is warm and dry.     Findings: No rash.  Neurological:     Mental Status: She is alert.     Deep Tendon Reflexes: Reflexes are normal and symmetric.  Psychiatric:        Mood and Affect: Mood and affect normal.           Assessment & Plan:   Problem List Items Addressed This Visit      Cardiovascular and Mediastinum   Essential hypertension - Primary    BP rose significantly when out of town after forgetting her metoprolol for 3 days  Also neck pain on L (suspect not related as it is positional) Started back on metoprolol last night and bp is starting to come down BP:  (!) 150/72    Pulse Rate: 80  Plan to continue metoprolol xl 50 mg daily with better compliance  Monitor at home over the weekend and call in Monday with update  If not back to baseline may need to inc or add tx         Other   Neck pain    Trapezius area on L side- worst to turn head right and tilt L  Some tenderness but no swelling or skin change  This occurred traveling- recommend getting back to regular bed and pillow  Heat  prn  Update if not starting to improve in a week or if worsening

## 2020-07-09 NOTE — Assessment & Plan Note (Signed)
Trapezius area on L side- worst to turn head right and tilt L  Some tenderness but no swelling or skin change  This occurred traveling- recommend getting back to regular bed and pillow  Heat prn  Update if not starting to improve in a week or if worsening

## 2020-07-15 ENCOUNTER — Encounter: Payer: Self-pay | Admitting: Family Medicine

## 2020-07-24 ENCOUNTER — Other Ambulatory Visit: Payer: Self-pay

## 2020-07-24 ENCOUNTER — Encounter: Payer: Self-pay | Admitting: Family Medicine

## 2020-07-24 ENCOUNTER — Ambulatory Visit (INDEPENDENT_AMBULATORY_CARE_PROVIDER_SITE_OTHER): Payer: Medicare Other | Admitting: Family Medicine

## 2020-07-24 ENCOUNTER — Telehealth: Payer: Self-pay | Admitting: Family Medicine

## 2020-07-24 VITALS — BP 160/80 | HR 72 | Temp 98.0°F | Ht 68.0 in | Wt 178.4 lb

## 2020-07-24 DIAGNOSIS — Z23 Encounter for immunization: Secondary | ICD-10-CM | POA: Diagnosis not present

## 2020-07-24 DIAGNOSIS — M25562 Pain in left knee: Secondary | ICD-10-CM

## 2020-07-24 DIAGNOSIS — L723 Sebaceous cyst: Secondary | ICD-10-CM | POA: Diagnosis not present

## 2020-07-24 DIAGNOSIS — M25561 Pain in right knee: Secondary | ICD-10-CM | POA: Diagnosis not present

## 2020-07-24 DIAGNOSIS — R413 Other amnesia: Secondary | ICD-10-CM | POA: Diagnosis not present

## 2020-07-24 DIAGNOSIS — G8929 Other chronic pain: Secondary | ICD-10-CM

## 2020-07-24 DIAGNOSIS — R12 Heartburn: Secondary | ICD-10-CM | POA: Insufficient documentation

## 2020-07-24 HISTORY — DX: Sebaceous cyst: L72.3

## 2020-07-24 NOTE — Progress Notes (Signed)
Subjective:     Mandy Castillo is a 79 y.o. female presenting for Heartburn (Pt stated that she has heartburn that has been doing on for x54month and also has a cyst on her neck and would like referral. Knee pain in both )     HPI   #HTN - home monitoring 130s/70s - taking metoprolol w/o issues  #Heartburn - symptoms x 1 month - new for her - occurring after eating - rush up the and under her heart - enjoys spicy foods and eats a lot of peppers  - happening with most meals - treatment: nothing - tried juice - no worse with laying down - worse in the morning  #Cyst on neck - present for a long time - saw a dermatologist a few years ago - could remove or watch - interested in removal now - has grown in size - has popped and been evacuated  #b[place] on the knees - scope on the left with operation - did not get that done on the right knee -    Review of Systems   Social History   Tobacco Use  Smoking Status Never Smoker  Smokeless Tobacco Never Used        Objective:    BP Readings from Last 3 Encounters:  07/24/20 (!) 160/80  07/09/20 (!) 150/72  04/21/20 140/80   Wt Readings from Last 3 Encounters:  07/24/20 178 lb 6.4 oz (80.9 kg)  07/09/20 175 lb 7 oz (79.6 kg)  04/21/20 179 lb 4 oz (81.3 kg)    BP (!) 160/80 (BP Location: Right Arm, Patient Position: Sitting, Cuff Size: Normal)   Pulse 72   Temp 98 F (36.7 C) (Temporal)   Ht 5\' 8"  (1.727 m)   Wt 178 lb 6.4 oz (80.9 kg)   SpO2 97%   BMI 27.13 kg/m    Physical Exam Constitutional:      General: She is not in acute distress.    Appearance: She is well-developed. She is not diaphoretic.  HENT:     Right Ear: External ear normal.     Left Ear: External ear normal.     Nose: Nose normal.  Eyes:     Conjunctiva/sclera: Conjunctivae normal.  Cardiovascular:     Rate and Rhythm: Normal rate and regular rhythm.     Heart  sounds: No murmur heard.   Pulmonary:     Effort: Pulmonary effort is normal. No respiratory distress.     Breath sounds: Normal breath sounds. No wheezing.  Abdominal:     General: Abdomen is flat. Bowel sounds are normal. There is no distension.     Palpations: Abdomen is soft.     Tenderness: There is no abdominal tenderness. There is no guarding.  Musculoskeletal:     Cervical back: Neck supple.  Skin:    General: Skin is warm and dry.     Capillary Refill: Capillary refill takes less than 2 seconds.     Comments: 2 cm sebaceous cyst at the base of the posterior neck.   Neurological:     Mental Status: She is alert. Mental status is at baseline.  Psychiatric:        Mood and Affect: Mood normal.        Behavior: Behavior normal.           Assessment & Plan:   Problem List Items Addressed This Visit  Musculoskeletal and Integument   Sebaceous cyst - Primary    Suspected cyst. Referral to surgery for removal.       Relevant Orders   Ambulatory referral to General Surgery     Other   Bilateral chronic knee pain    Chronic with hx of arthroscopic surgery on left. Would like PT to help with symptoms.       Relevant Orders   Ambulatory referral to Physical Therapy   Heart burn    Trial of avoiding spicy food and other triggers. Omeprazole if not improving.       Memory loss    Pt called after the visit and realized she wanted to discuss memory loss. She will make f/u visit in 2-4 weeks to check in on heart burn and evaluate memory       Other Visit Diagnoses    Need for immunization against influenza       Relevant Orders   Flu Vaccine QUAD High Dose(Fluad) (Completed)       Return in about 4 weeks (around 08/21/2020) for memory.  Lynnda Child, MD  This visit occurred during the SARS-CoV-2 public health emergency.  Safety protocols were in place, including screening questions prior to the visit, additional usage of staff PPE, and extensive  cleaning of exam room while observing appropriate contact time as indicated for disinfecting solutions.

## 2020-07-24 NOTE — Assessment & Plan Note (Signed)
Chronic with hx of arthroscopic surgery on left. Would like PT to help with symptoms.

## 2020-07-24 NOTE — Patient Instructions (Signed)
Heartburn - avoid triggers as below - if no improvement in 2 weeks, start medicatin - over the counter Omeprazole 20 mg daily - if no improvement after 2 weeks of omeprazole -- call the clinic  #Knee pain - referral to physical therapy  #Cyst - referral to surgery    Heartburn Heartburn is a type of pain or discomfort that can happen in the throat or chest. It is often described as a burning pain. It may also cause a bad, acid-like taste in the mouth. Heartburn may feel worse when you lie down or bend over. It may be worse at night. It may be caused by stomach contents that move back up (reflux) into the tube that connects the mouth with the stomach (esophagus). Follow these instructions at home: Eating and drinking   Avoid certain foods and drinks as told by your doctor. This may include: ? Coffee and tea (with or without caffeine). ? Drinks that have alcohol. ? Energy drinks and sports drinks. ? Carbonated drinks or sodas. ? Chocolate and cocoa. ? Peppermint and mint flavorings. ? Garlic and onions. ? Horseradish. ? Spicy and acidic foods, such as:  Peppers.  Chili powder and curry powder.  Vinegar.  Hot sauces and BBQ sauce. ? Citrus fruit juices and citrus fruits, such as:  Oranges.  Lemons.  Limes. ? Tomato-based foods, such as:  Red sauce and pizza with red sauce.  Chili.  Salsa. ? Fried and fatty foods, such as:  Donuts.  [place] fries and potato chips.  High-fat dressings. ? High-fat meats, such as:  Hot dogs and sausage.  Rib eye steak.  Ham and bacon. ? High-fat dairy items, such as:  Whole milk.  Butter.  Cream cheese.  Eat small meals often. Avoid eating large meals.  Avoid drinking large amounts of liquid with your meals.  Avoid eating meals during the 2-3 hours before bedtime.  Avoid lying down right after you eat.  Do not exercise right after you eat. Lifestyle      If you are overweight, lose an amount of weight  that is healthy for you. Ask your doctor about a safe weight loss goal.  Do not use any products that contain nicotine or tobacco, including cigarettes, e-cigarettes, and chewing tobacco. These can make your symptoms worse. If you need help quitting, ask your doctor.  Wear loose clothes. Do not wear anything tight around your waist.  Raise (elevate) the head of your bed about 6 inches (15 cm) when you sleep.  Try to lower your stress. If you need help doing this, ask your doctor. General instructions  Pay attention to any changes in your symptoms.  Take over-the-counter and prescription medicines only as told by your doctor. ? Do not take aspirin, ibuprofen, or other NSAIDs unless your doctor says it is okay. ? Stop medicines only as told by your doctor.  Keep all follow-up visits as told by your doctor. This is important. Contact a doctor if:  You have new symptoms.  You lose weight and you do not know why it is happening.  You have trouble swallowing, or it hurts to swallow.  You have wheezing or a cough that keeps happening.  Your symptoms do not get better with treatment.  You have heartburn often for more than 2 weeks. Get help right away if:  You have pain in your arms, neck, jaw, teeth, or back.  You feel sweaty, dizzy, or light-headed.  You have chest pain or shortness of breath.  You throw up (vomit) and your throw up looks like blood or coffee grounds.  Your poop (stool) is bloody or black. These symptoms may represent a serious problem that is an emergency. Do not wait to see if the symptoms will go away. Get medical help right away. Call your local emergency services (911 in the [place] Do not drive yourself to the hospital. Summary  Heartburn is a type of pain that can happen in the throat or chest. It can feel like a burning pain. It may also cause a bad, acid-like taste in the mouth.  You may need to avoid certain foods and drinks to help your symptoms.  Ask your doctor what foods and drinks you should avoid.  Take over-the-counter and prescription medicines only as told by your doctor. Do not take aspirin, ibuprofen, or other NSAIDs unless your doctor told you to do so.  Contact your doctor if your symptoms do not get better or they get worse. This information is not intended to replace advice given to you by your health care provider. Make sure you discuss any questions you have with your health care provider. Document Revised: 12/12/2017 Document Reviewed: 12/12/2017 Elsevier Patient Education  2020 [employer].

## 2020-07-24 NOTE — Assessment & Plan Note (Signed)
Pt called after the visit and realized she wanted to discuss memory loss. She will make f/u visit in 2-4 weeks to check in on heart burn and evaluate memory

## 2020-07-24 NOTE — Telephone Encounter (Signed)
Pt called in after reading the after care summary she forgot to tell Dr. Selena Batten something important.

## 2020-07-24 NOTE — Assessment & Plan Note (Signed)
Suspected cyst. Referral to surgery for removal.

## 2020-07-24 NOTE — Assessment & Plan Note (Signed)
Trial of avoiding spicy food and other triggers. Omeprazole if not improving.

## 2020-07-24 NOTE — Telephone Encounter (Signed)
Called patient back  She has noticed a lot of issues with her memory  Advised she schedule follow-up visit to discuss in detail in 2-4 weeks so we can also see if heartburn has improved  She will call to schedule

## 2020-08-06 DIAGNOSIS — M1712 Unilateral primary osteoarthritis, left knee: Secondary | ICD-10-CM | POA: Diagnosis not present

## 2020-08-06 DIAGNOSIS — M1711 Unilateral primary osteoarthritis, right knee: Secondary | ICD-10-CM | POA: Diagnosis not present

## 2020-08-06 DIAGNOSIS — M25662 Stiffness of left knee, not elsewhere classified: Secondary | ICD-10-CM | POA: Diagnosis not present

## 2020-08-06 DIAGNOSIS — M25661 Stiffness of right knee, not elsewhere classified: Secondary | ICD-10-CM | POA: Diagnosis not present

## 2020-08-14 ENCOUNTER — Encounter: Payer: Self-pay | Admitting: Family Medicine

## 2020-08-19 DIAGNOSIS — M170 Bilateral primary osteoarthritis of knee: Secondary | ICD-10-CM | POA: Diagnosis not present

## 2020-08-19 DIAGNOSIS — M25561 Pain in right knee: Secondary | ICD-10-CM | POA: Diagnosis not present

## 2020-08-19 DIAGNOSIS — M25562 Pain in left knee: Secondary | ICD-10-CM | POA: Diagnosis not present

## 2020-08-21 DIAGNOSIS — M1712 Unilateral primary osteoarthritis, left knee: Secondary | ICD-10-CM | POA: Diagnosis not present

## 2020-08-21 DIAGNOSIS — M25661 Stiffness of right knee, not elsewhere classified: Secondary | ICD-10-CM | POA: Diagnosis not present

## 2020-08-21 DIAGNOSIS — M25662 Stiffness of left knee, not elsewhere classified: Secondary | ICD-10-CM | POA: Diagnosis not present

## 2020-08-21 DIAGNOSIS — M1711 Unilateral primary osteoarthritis, right knee: Secondary | ICD-10-CM | POA: Diagnosis not present

## 2020-08-26 DIAGNOSIS — M25662 Stiffness of left knee, not elsewhere classified: Secondary | ICD-10-CM | POA: Diagnosis not present

## 2020-08-26 DIAGNOSIS — M25661 Stiffness of right knee, not elsewhere classified: Secondary | ICD-10-CM | POA: Diagnosis not present

## 2020-08-26 DIAGNOSIS — M1712 Unilateral primary osteoarthritis, left knee: Secondary | ICD-10-CM | POA: Diagnosis not present

## 2020-08-26 DIAGNOSIS — M1711 Unilateral primary osteoarthritis, right knee: Secondary | ICD-10-CM | POA: Diagnosis not present

## 2020-08-28 DIAGNOSIS — M25662 Stiffness of left knee, not elsewhere classified: Secondary | ICD-10-CM | POA: Diagnosis not present

## 2020-08-28 DIAGNOSIS — M1712 Unilateral primary osteoarthritis, left knee: Secondary | ICD-10-CM | POA: Diagnosis not present

## 2020-08-28 DIAGNOSIS — M25661 Stiffness of right knee, not elsewhere classified: Secondary | ICD-10-CM | POA: Diagnosis not present

## 2020-08-28 DIAGNOSIS — M1711 Unilateral primary osteoarthritis, right knee: Secondary | ICD-10-CM | POA: Diagnosis not present

## 2020-09-01 ENCOUNTER — Other Ambulatory Visit: Payer: Self-pay

## 2020-09-01 ENCOUNTER — Ambulatory Visit (INDEPENDENT_AMBULATORY_CARE_PROVIDER_SITE_OTHER): Payer: Medicare Other | Admitting: Family Medicine

## 2020-09-01 VITALS — BP 122/70 | HR 66 | Temp 97.7°F | Ht 68.0 in | Wt 175.5 lb

## 2020-09-01 DIAGNOSIS — G3184 Mild cognitive impairment, so stated: Secondary | ICD-10-CM

## 2020-09-01 LAB — TSH: TSH: 3.52 u[IU]/mL (ref 0.35–4.50)

## 2020-09-01 LAB — VITAMIN B12: Vitamin B-12: >1506 pg/mL — ABNORMAL HIGH (ref 211–911)

## 2020-09-01 NOTE — Patient Instructions (Addendum)
Labs today  You should get a phone call about your MRI  Referral to Neurology  Regular exercise Healthy diet Good sleep Mind games - like puzzles   Would advise that you stop driving Bring your partner if you feel comfortable to your neurology visit    Memory Compensation Strategies  1. Use "WARM" strategy.  W= write it down  A= associate it  R= repeat it  M= make a mental note  2.   You can keep a Glass blower/designer.  Use a 3-ring notebook with sections for the following: calendar, important names and phone numbers,  medications, doctors' names/phone numbers, lists/reminders, and a section to journal what you did  each day.   3.    Use a calendar to write appointments down.  4.    Write yourself a schedule for the day.  This can be placed on the calendar or in a separate section of the Memory Notebook.  Keeping a  regular schedule can help memory.  5.    Use medication organizer with sections for each day or morning/evening pills.  You may need help loading it  6.    Keep a basket, or pegboard by the door.  Place items that you need to take out with you in the basket or on the pegboard.  You may also want to  include a message board for reminders.  7.    Use sticky notes.  Place sticky notes with reminders in a place where the task is performed.  For example: " turn off the  stove" placed by the stove, "lock the door" placed on the door at eye level, " take your medications" on  the bathroom mirror or by the place where you normally take your medications.  8.    Use alarms/timers.  Use while cooking to remind yourself to check on food or as a reminder to take your medicine, or as a  reminder to make a call, or as a reminder to perform another task, etc.

## 2020-09-01 NOTE — Assessment & Plan Note (Signed)
Discussed MOCA results with patient. Encouraged that she stop driving due to some issues finding the right car when riding with friends. Neurology referral. Labs and MRI. Advised f/u if worsening or unable to see neuro and otherwise plan 3 months. Of note, pt was sent to the lab and left without getting this done. I discussed that she is very intelligent and am worried her symptoms may be worse than they appear through conversation. Appreciate neuro support.

## 2020-09-01 NOTE — Progress Notes (Signed)
Subjective:     Mandy Castillo is a 80 y.o. female presenting for Memory Loss and Follow-up (Heartburn "is under control")     HPI  #memory loss - no issues with sleep - no depression - not as active recently - symptoms started 2 years ago - does not drive much, no issues getting lost - goes to the store with her friend - and will go to the wrong car  - forgets to turn the water off at home occasionally - also forgot toast in [employer]   Lives with friend - he is 8 years younger - partner x 12 years   Review of Systems   Social History   Tobacco Use  Smoking Status Never Smoker  Smokeless Tobacco Never Used        Objective:    BP Readings from Last 3 Encounters:  09/01/20 122/70  07/24/20 (!) 160/80  07/09/20 (!) 150/72   Wt Readings from Last 3 Encounters:  09/01/20 175 lb 8 oz (79.6 kg)  07/24/20 178 lb 6.4 oz (80.9 kg)  07/09/20 175 lb 7 oz (79.6 kg)    BP 122/70   Pulse 66   Temp 97.7 F (36.5 C) (Temporal)   Ht 5\' 8"  (1.727 m)   Wt 175 lb 8 oz (79.6 kg)   SpO2 100%   BMI 26.68 kg/m    Physical Exam Constitutional:      General: She is not in acute distress.    Appearance: She is well-developed. She is not diaphoretic.  HENT:     Right Ear: External ear normal.     Left Ear: External ear normal.  Eyes:     Conjunctiva/sclera: Conjunctivae normal.  Cardiovascular:     Rate and Rhythm: Normal rate.  Pulmonary:     Effort: Pulmonary effort is normal.  Musculoskeletal:     Cervical back: Neck supple.  Skin:    General: Skin is warm and dry.     Capillary Refill: Capillary refill takes less than 2 seconds.  Neurological:     Mental Status: She is alert. Mental status is at baseline.  Psychiatric:        Mood and Affect: Mood normal.        Behavior: Behavior normal.     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 Encouraged that she stop driving due to some issues finding the right car when riding with friends. Neurology referral. Labs and MRI. Advised f/u if worsening or unable to see neuro and otherwise plan 3 months. Of note, pt was sent to the lab and left without getting this done. I discussed that she is very intelligent and am worried her symptoms may be worse than they appear through conversation. Appreciate neuro support.       Relevant Orders   MR Brain Wo Contrast   Ambulatory referral to Neurology  Vitamin B12   TSH       Return in about 3 months (around 11/29/2020).  Lynnda Child, MD  This visit occurred during the SARS-CoV-2 public health emergency.  Safety protocols were in place, including screening questions prior to the visit, additional usage of staff PPE, and extensive cleaning of exam room while observing appropriate contact time as indicated for disinfecting solutions.

## 2020-09-02 DIAGNOSIS — M1712 Unilateral primary osteoarthritis, left knee: Secondary | ICD-10-CM | POA: Diagnosis not present

## 2020-09-02 DIAGNOSIS — M25661 Stiffness of right knee, not elsewhere classified: Secondary | ICD-10-CM | POA: Diagnosis not present

## 2020-09-02 DIAGNOSIS — M25662 Stiffness of left knee, not elsewhere classified: Secondary | ICD-10-CM | POA: Diagnosis not present

## 2020-09-02 DIAGNOSIS — M1711 Unilateral primary osteoarthritis, right knee: Secondary | ICD-10-CM | POA: Diagnosis not present

## 2020-09-03 ENCOUNTER — Telehealth: Payer: Self-pay

## 2020-09-03 NOTE — Telephone Encounter (Signed)
Left message for patient to call back to discuss MRI appt scheduling and pre screening questions. 

## 2020-09-04 DIAGNOSIS — M25662 Stiffness of left knee, not elsewhere classified: Secondary | ICD-10-CM | POA: Diagnosis not present

## 2020-09-04 DIAGNOSIS — M25661 Stiffness of right knee, not elsewhere classified: Secondary | ICD-10-CM | POA: Diagnosis not present

## 2020-09-04 DIAGNOSIS — M1712 Unilateral primary osteoarthritis, left knee: Secondary | ICD-10-CM | POA: Diagnosis not present

## 2020-09-04 DIAGNOSIS — M1711 Unilateral primary osteoarthritis, right knee: Secondary | ICD-10-CM | POA: Diagnosis not present

## 2020-09-05 NOTE — Telephone Encounter (Signed)
Left message for patient to call back to discuss MRI appt scheduling and pre screening questions.

## 2020-09-05 NOTE — Telephone Encounter (Signed)
Spoke with patient. Patient is scheduled for 09/09/20 at [facility] for MRI

## 2020-09-05 NOTE — Telephone Encounter (Signed)
Pt called in to return the phone call

## 2020-09-09 ENCOUNTER — Encounter: Payer: Self-pay | Admitting: Neurology

## 2020-09-09 ENCOUNTER — Ambulatory Visit
Admission: RE | Admit: 2020-09-09 | Discharge: 2020-09-09 | Disposition: A | Discharge status: Home / Self Care | Payer: Medicare Other | Source: Ambulatory Visit | Attending: Family Medicine | Admitting: Family Medicine

## 2020-09-09 ENCOUNTER — Other Ambulatory Visit: Payer: Self-pay

## 2020-09-09 DIAGNOSIS — I6782 Cerebral ischemia: Secondary | ICD-10-CM | POA: Diagnosis not present

## 2020-09-09 DIAGNOSIS — R296 Repeated falls: Secondary | ICD-10-CM | POA: Diagnosis not present

## 2020-09-09 DIAGNOSIS — G3184 Mild cognitive impairment, so stated: Secondary | ICD-10-CM | POA: Diagnosis not present

## 2020-09-23 DIAGNOSIS — M1711 Unilateral primary osteoarthritis, right knee: Secondary | ICD-10-CM | POA: Diagnosis not present

## 2020-09-23 DIAGNOSIS — M25562 Pain in left knee: Secondary | ICD-10-CM | POA: Diagnosis not present

## 2020-09-23 DIAGNOSIS — M25561 Pain in right knee: Secondary | ICD-10-CM | POA: Diagnosis not present

## 2020-09-23 DIAGNOSIS — M1712 Unilateral primary osteoarthritis, left knee: Secondary | ICD-10-CM | POA: Diagnosis not present

## 2020-10-01 DIAGNOSIS — R413 Other amnesia: Secondary | ICD-10-CM | POA: Diagnosis not present

## 2020-10-08 DIAGNOSIS — L720 Epidermal cyst: Secondary | ICD-10-CM | POA: Diagnosis not present

## 2020-10-14 DIAGNOSIS — L723 Sebaceous cyst: Secondary | ICD-10-CM | POA: Diagnosis not present

## 2020-11-18 DIAGNOSIS — M25561 Pain in right knee: Secondary | ICD-10-CM | POA: Diagnosis not present

## 2020-11-18 DIAGNOSIS — M1712 Unilateral primary osteoarthritis, left knee: Secondary | ICD-10-CM | POA: Diagnosis not present

## 2020-11-18 DIAGNOSIS — M25562 Pain in left knee: Secondary | ICD-10-CM | POA: Diagnosis not present

## 2020-11-18 DIAGNOSIS — M1711 Unilateral primary osteoarthritis, right knee: Secondary | ICD-10-CM | POA: Diagnosis not present

## 2020-11-27 ENCOUNTER — Other Ambulatory Visit: Payer: Self-pay | Admitting: Physician Assistant

## 2020-12-01 ENCOUNTER — Encounter: Payer: Self-pay | Admitting: Family Medicine

## 2020-12-01 ENCOUNTER — Ambulatory Visit (INDEPENDENT_AMBULATORY_CARE_PROVIDER_SITE_OTHER): Payer: Medicare Other | Admitting: Family Medicine

## 2020-12-01 ENCOUNTER — Other Ambulatory Visit: Payer: Self-pay

## 2020-12-01 VITALS — BP 132/74 | HR 53 | Temp 97.2°F | Ht 68.0 in | Wt 175.0 lb

## 2020-12-01 DIAGNOSIS — G3184 Mild cognitive impairment, so stated: Secondary | ICD-10-CM | POA: Diagnosis not present

## 2020-12-01 DIAGNOSIS — Z136 Encounter for screening for cardiovascular disorders: Secondary | ICD-10-CM | POA: Diagnosis not present

## 2020-12-01 DIAGNOSIS — I10 Essential (primary) hypertension: Secondary | ICD-10-CM | POA: Diagnosis not present

## 2020-12-01 DIAGNOSIS — I4892 Unspecified atrial flutter: Secondary | ICD-10-CM | POA: Diagnosis not present

## 2020-12-01 LAB — LIPID PANEL
Cholesterol: 256 mg/dL — ABNORMAL HIGH (ref 0–200)
HDL: 145.6 mg/dL (ref >39.00)
LDL Cholesterol: 100 mg/dL — ABNORMAL HIGH (ref 0–99)
NonHDL: 110.14
Total CHOL/HDL Ratio: 2
Triglycerides: 50 mg/dL (ref 0.0–149.0)
VLDL: 10 mg/dL (ref 0.0–40.0)

## 2020-12-01 NOTE — Patient Instructions (Addendum)
We will cancel your appointment with Dr. Karel Jarvis -- continue to see Dr. Malvin Johns   Return next year for wellness or sooner if needed

## 2020-12-01 NOTE — Assessment & Plan Note (Addendum)
Stable - follows with Dr. Tresa Endo with cardiology. Cont metoprolol 50 mg

## 2020-12-01 NOTE — Assessment & Plan Note (Signed)
Following with Dr. Malvin Johns. Started aricept and having significant improvement.

## 2020-12-01 NOTE — Progress Notes (Signed)
Subjective:     Mandy Castillo is a 80 y.o. female presenting for Memory Loss (Patient states its a lot better. )     HPI  #Memory loss - doing well on aricept - has noticed a difference in her memory with this medication - "has never felt better" - has a f/u with neurology  - is able to do more in the kitchen and focusing on tasks  Walking is also helping significantly    Review of Systems   09/01/2020: Clinic - memory loss - MOCA abnormal. Neuro referral.  10/01/2020: Neuro - mild memory loss - aricept at night, puzzles, exercise   Social History   Tobacco Use  Smoking Status Never Smoker  Smokeless Tobacco Never Used        Objective:    BP Readings from Last 3 Encounters:  12/01/20 132/74  09/01/20 122/70  07/24/20 (!) 160/80   Wt Readings from Last 3 Encounters:  12/01/20 175 lb (79.4 kg)  09/01/20 175 lb 8 oz (79.6 kg)  07/24/20 178 lb 6.4 oz (80.9 kg)    BP 132/74   Pulse (!) 53   Temp (!) 97.2 F (36.2 C) (Temporal)   Ht 5\' 8"  (1.727 m)   Wt 175 lb (79.4 kg)   SpO2 99%   BMI 26.61 kg/m    Physical Exam Constitutional:      General: She is not in acute distress.    Appearance: She is well-developed. She is not diaphoretic.  HENT:     Right Ear: External ear normal.     Left Ear: External ear normal.  Eyes:     Conjunctiva/sclera: Conjunctivae normal.  Cardiovascular:     Rate and Rhythm: Normal rate and regular rhythm.     Heart sounds: No murmur heard.   Pulmonary:     Effort: Pulmonary effort is normal. No respiratory distress.     Breath sounds: Normal breath sounds. No wheezing.  Musculoskeletal:     Cervical back: Neck supple.  Skin:    General: Skin is warm and dry.     Capillary Refill: Capillary refill takes less than 2 seconds.  Neurological:     Mental Status: She is alert. Mental status is at baseline.  Psychiatric:        Mood and Affect: Mood normal.        Behavior: Behavior normal.           Assessment &  Plan:   Problem List Items Addressed This Visit      Cardiovascular and Mediastinum   Atrial flutter (HCC)    Stable - follows with Dr. with cardiology. Cont metoprolol 50 mg      Essential hypertension - Primary    BP controlled. Cont metoprolol 50 mg        Nervous and Auditory   Mild cognitive impairment with memory loss    Following with Dr. Tresa Endo. Started aricept and having significant improvement.        Other Visit Diagnoses    Encounter for special screening examination for cardiovascular disorder       Relevant Orders   Lipid panel       Return in about 1 year (around 12/01/2021) for Annual wellness .  01/31/2022, MD  This visit occurred during the SARS-CoV-2 public health emergency.  Safety protocols were in place, including screening questions prior to the visit, additional usage of staff PPE, and extensive cleaning of exam room while observing appropriate contact  time as indicated for disinfecting solutions.

## 2020-12-01 NOTE — Assessment & Plan Note (Signed)
BP controlled. Cont metoprolol 50 mg

## 2020-12-04 ENCOUNTER — Encounter: Payer: Self-pay | Admitting: Family Medicine

## 2020-12-12 ENCOUNTER — Ambulatory Visit: Payer: Medicare Other | Admitting: Neurology

## 2020-12-23 ENCOUNTER — Ambulatory Visit (INDEPENDENT_AMBULATORY_CARE_PROVIDER_SITE_OTHER): Payer: Medicare Other | Admitting: Family Medicine

## 2020-12-23 ENCOUNTER — Other Ambulatory Visit: Payer: Self-pay

## 2020-12-23 VITALS — BP 148/80 | HR 72 | Temp 97.9°F | Wt 173.5 lb

## 2020-12-23 DIAGNOSIS — R109 Unspecified abdominal pain: Secondary | ICD-10-CM | POA: Insufficient documentation

## 2020-12-23 LAB — CBC WITH DIFFERENTIAL/PLATELET
Basophils Absolute: 0 10*3/uL (ref 0.0–0.1)
Basophils Relative: 0.4 % (ref 0.0–3.0)
Eosinophils Absolute: 0.2 10*3/uL (ref 0.0–0.7)
Eosinophils Relative: 2.6 % (ref 0.0–5.0)
HCT: 39.8 % (ref 36.0–46.0)
Hemoglobin: 13.6 g/dL (ref 12.0–15.0)
Lymphocytes Relative: 39.7 % (ref 12.0–46.0)
Lymphs Abs: 2.5 10*3/uL (ref 0.7–4.0)
MCHC: 34.1 g/dL (ref 30.0–36.0)
MCV: 87.7 fl (ref 78.0–100.0)
Monocytes Absolute: 0.7 10*3/uL (ref 0.1–1.0)
Monocytes Relative: 11.4 % (ref 3.0–12.0)
Neutro Abs: 2.8 10*3/uL (ref 1.4–7.7)
Neutrophils Relative %: 45.9 % (ref 43.0–77.0)
Platelets: 333 10*3/uL (ref 150.0–400.0)
RBC: 4.54 Mil/uL (ref 3.87–5.11)
RDW: 13.4 % (ref 11.5–15.5)
WBC: 6.2 10*3/uL (ref 4.0–10.5)

## 2020-12-23 LAB — COMPREHENSIVE METABOLIC PANEL
ALT: 14 U/L (ref 0–35)
AST: 16 U/L (ref 0–37)
Albumin: 4.4 g/dL (ref 3.5–5.2)
Alkaline Phosphatase: 61 U/L (ref 39–117)
BUN: 15 mg/dL (ref 6–23)
CO2: 29 mEq/L (ref 19–32)
Calcium: 9.9 mg/dL (ref 8.4–10.5)
Chloride: 103 mEq/L (ref 96–112)
Creatinine, Ser: 0.87 mg/dL (ref 0.40–1.20)
GFR: 62.06 mL/min (ref >60.00)
Glucose, Bld: 77 mg/dL (ref 70–99)
Potassium: 4.2 mEq/L (ref 3.5–5.1)
Sodium: 141 mEq/L (ref 135–145)
Total Bilirubin: 0.6 mg/dL (ref 0.2–1.2)
Total Protein: 7.3 g/dL (ref 6.0–8.3)

## 2020-12-23 NOTE — Patient Instructions (Addendum)
Try to stretch the side  Hold for 20-30 seconds and doe 2-3 times per day  Can use Icy/hot or voltaren gel on the area  Call or Mychart if worsening or not improving and can consider imaging to rule out other causes

## 2020-12-23 NOTE — Progress Notes (Signed)
Subjective:     Mandy Castillo is a 80 y.o. female presenting for Abdominal Pain (R side on and off over last 6 months )     HPI  #Abdominal pain - right lower abdomen - normally would have ignored it but the pain started a while back - did have severe pain episode - grabbing pain and took her to her knees - she held the side for about 7 minutes and releasing the hand the symptoms improved - did not completely resolve - hx of GB removal - felt like she had been stabbed - that happened last Tuesday (1 week ago) - followed by soreness for a few days - feels tight in the area - s/p hysterectomy, GB, appendectomy  - unclear if any triggered - has been having "catches" with movement - maybe once per month   Review of Systems  Gastrointestinal: Positive for abdominal pain. Negative for blood in stool, constipation, diarrhea, nausea and vomiting.  Genitourinary: Negative for difficulty urinating, dysuria, frequency and urgency.     Social History   Tobacco Use  Smoking Status Never Smoker  Smokeless Tobacco Never Used        Objective:    BP Readings from Last 3 Encounters:  12/23/20 (!) 148/80  12/01/20 132/74  09/01/20 122/70   Wt Readings from Last 3 Encounters:  12/23/20 173 lb 8 oz (78.7 kg)  12/01/20 175 lb (79.4 kg)  09/01/20 175 lb 8 oz (79.6 kg)    BP (!) 148/80   Pulse 72   Temp 97.9 F (36.6 C) (Temporal)   Wt 173 lb 8 oz (78.7 kg)   SpO2 100%   BMI 26.38 kg/m    Physical Exam Constitutional:      General: She is not in acute distress.    Appearance: She is well-developed. She is not diaphoretic.  HENT:     Right Ear: External ear normal.     Left Ear: External ear normal.     Nose: Nose normal.  Eyes:     Conjunctiva/sclera: Conjunctivae normal.  Cardiovascular:     Rate and Rhythm: Normal rate and regular rhythm.     Heart sounds: No murmur heard.   Pulmonary:     Effort: Pulmonary effort is normal. No respiratory distress.      Breath sounds: Normal breath sounds. No wheezing.  Abdominal:     General: Abdomen is flat. Bowel sounds are normal. There is no distension.     Palpations: Abdomen is soft.     Tenderness: There is abdominal tenderness (Along the lower right abdominal muscles on the side). There is no guarding or rebound.  Musculoskeletal:     Cervical back: Neck supple.  Skin:    General: Skin is warm and dry.     Capillary Refill: Capillary refill takes less than 2 seconds.  Neurological:     Mental Status: She is alert. Mental status is at baseline.  Psychiatric:        Mood and Affect: Mood normal.        Behavior: Behavior normal.           Assessment & Plan:   Problem List Items Addressed This Visit      Other   Right sided abdominal pain - Primary    Suspect MSK related pain given symptoms worse with movement and location maximum tenderness over the muscle area. Pt no longer had GB or appendix. Will get some blood work to evaluate for possible liver/kidney/infeciton.  Advised stretching routine and call/mychart if not improving over the next month.       Relevant Orders   Comprehensive metabolic panel   CBC with Differential       Return if symptoms worsen or fail to improve.  Lynnda Child, MD  This visit occurred during the SARS-CoV-2 public health emergency.  Safety protocols were in place, including screening questions prior to the visit, additional usage of staff PPE, and extensive cleaning of exam room while observing appropriate contact time as indicated for disinfecting solutions.

## 2020-12-23 NOTE — Assessment & Plan Note (Signed)
Suspect MSK related pain given symptoms worse with movement and location maximum tenderness over the muscle area. Pt no longer had GB or appendix. Will get some blood work to evaluate for possible liver/kidney/infeciton. Advised stretching routine and call/mychart if not improving over the next month.

## 2020-12-25 ENCOUNTER — Encounter: Payer: Self-pay | Admitting: Family Medicine

## 2021-01-07 DIAGNOSIS — R413 Other amnesia: Secondary | ICD-10-CM | POA: Diagnosis not present

## 2021-02-07 ENCOUNTER — Ambulatory Visit (HOSPITAL_COMMUNITY)
Admission: EM | Admit: 2021-02-07 | Discharge: 2021-02-07 | Disposition: A | Discharge status: Home / Self Care | Payer: Medicare Other | Attending: Physician Assistant | Admitting: Physician Assistant

## 2021-02-07 ENCOUNTER — Encounter (HOSPITAL_COMMUNITY): Payer: Self-pay

## 2021-02-07 ENCOUNTER — Ambulatory Visit (INDEPENDENT_AMBULATORY_CARE_PROVIDER_SITE_OTHER): Payer: Medicare Other

## 2021-02-07 DIAGNOSIS — J069 Acute upper respiratory infection, unspecified: Secondary | ICD-10-CM | POA: Diagnosis not present

## 2021-02-07 DIAGNOSIS — R059 Cough, unspecified: Secondary | ICD-10-CM

## 2021-02-07 DIAGNOSIS — U071 COVID-19: Secondary | ICD-10-CM | POA: Diagnosis not present

## 2021-02-07 DIAGNOSIS — R0602 Shortness of breath: Secondary | ICD-10-CM

## 2021-02-07 LAB — POC INFLUENZA A AND B ANTIGEN (URGENT CARE ONLY)
INFLUENZA A ANTIGEN, POC: NEGATIVE
INFLUENZA B ANTIGEN, POC: NEGATIVE

## 2021-02-07 MED ORDER — BENZONATATE 100 MG PO CAPS: 100.0000 mg | ORAL_CAPSULE | Freq: Three times a day (TID) | ORAL | 0 refills | Status: DC

## 2021-02-07 NOTE — Discharge Instructions (Addendum)
Your flu test was negative.  We will contact you if your COVID-19 test is positive.  Use Tessalon for cough.  Use Mucinex and Flonase for additional symptom relief.  Make sure you are drinking plenty of fluid.  If you have any worsening symptoms you need to go to the emergency room.  Please follow-up with primary care provider early next week for reevaluation.

## 2021-02-07 NOTE — ED Triage Notes (Signed)
Pt presents with cough, chest congestion X 4 days.

## 2021-02-07 NOTE — ED Provider Notes (Signed)
[facility]    CSN: [id number] Arrival date & time: 02/07/21  1436      History   Chief Complaint Chief Complaint  Patient presents with   Cough    HPI Mandy Castillo is a 80 y.o. female.   Patient presents today with URI symptoms for 2 days.  Reports cough that has become productive with thick purulent sputum.  She reports fatigue, malaise, sore throat, body aches, headache.  She does report some associated shortness of breath particularly with coughing fits.  She denies any chest pain, nausea, vomiting, dizziness, syncope.  She has not tried any over-the-counter medications for symptom management.  Denies any known sick contacts.  Denies any recent antibiotic use.  Denies history of allergies, asthma, COPD, smoking.  She did attempt an at-home COVID test but is unsure if this is positive or negative.  She has had COVID-19 vaccinations including boosters.   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      Home Medications    Prior to Admission medications   Medication Sig Start Date End Date Taking? Authorizing Provider  benzonatate (TESSALON) 100 MG capsule Take 1 capsule (100 mg total) by mouth every 8 (eight) hours. 02/07/21  Yes Emiya Loomer, Noberto Retort, PA-C  aspirin EC 81 MG tablet Take 1 tablet (81 mg total) by mouth daily. 04/23/19   Lennette Bihari, MD   Cholecalciferol (D3 PO) Take by mouth daily.    [provider]  Cyanocobalamin (B-12 PO) Take by mouth daily.    [provider]  donepezil (ARICEPT) 5 MG tablet Take 1/2 tablet at night for 1 week, then increase to 1  tablet 10/01/20   [provider]  metoprolol succinate (TOPROL-XL) 50 MG 24 hr tablet TAKE 1 TABLET (50 MG TOTAL) BY MOUTH DAILY. TAKE WITH OR IMMEDIATELY FOLLOWING A MEAL. 11/27/20 02/25/21  Azalee Course, PA    Family History Family History  Problem Relation Age of Onset   Stroke Mother 43   Prostate cancer Father     Social History Social History   Tobacco Use   Smoking status: Never   Smokeless tobacco: Never  Substance Use Topics   Alcohol use: Yes    Comment: wine every few months   Drug use: Never     Allergies   Patient has no known allergies.   Review of Systems Review of Systems  Constitutional:  Positive for activity change and fatigue. Negative for appetite change and fever.  HENT:  Positive for congestion and sore throat. Negative for sinus pressure and sneezing.   Respiratory:  Positive for cough and shortness of breath.   Cardiovascular:  Negative for chest pain.  Gastrointestinal:  Negative for abdominal pain, diarrhea, nausea and vomiting.  Musculoskeletal:  Positive for arthralgias and myalgias.  Neurological:  Positive for headaches. Negative for dizziness and light-headedness.    Physical Exam Triage Vital Signs ED Triage Vitals  Enc Vitals Group     BP 02/07/21 1632 (!) 159/94     Pulse Rate 02/07/21 1632 85     Resp 02/07/21 1632 17     Temp 02/07/21 1632 99.9 F (37.7 C)     Temp Source 02/07/21 1632 Oral     SpO2 02/07/21 1632 95 %     Weight --      Height --      Head Circumference --      Peak Flow --      Pain Score 02/07/21 1631 0     Pain Loc --      Pain Edu? --      Excl. in GC? --    No data found.  Updated Vital Signs BP (!) 159/94 (BP Location: Right Arm)   Pulse 85   Temp 99.9 F  (37.7 C) (Oral)   Resp 17   SpO2 95%   Visual Acuity Right Eye Distance:   Left Eye Distance:   Bilateral Distance:    Right Eye Near:   Left Eye Near:    Bilateral Near:     Physical Exam Vitals reviewed.  Constitutional:      General: She is awake. She is not in acute distress.    Appearance: Normal appearance. She is normal weight. She is not ill-appearing.     Comments: Very pleasant female appears stated age no acute distress  HENT:     Head: Normocephalic and atraumatic.     Right Ear: Tympanic membrane, ear canal and external ear normal. Tympanic membrane is not erythematous or bulging.     Left Ear: Tympanic membrane, ear canal and external ear normal. Tympanic membrane is not erythematous or bulging.     Nose:     Right Sinus: Maxillary sinus tenderness present. No frontal sinus tenderness.     Left Sinus: Maxillary sinus tenderness present. No frontal sinus tenderness.     Mouth/Throat:     Pharynx: Uvula midline. Posterior oropharyngeal erythema present. No oropharyngeal exudate.     Comments: Moderate erythema and drainage in posterior oropharynx Cardiovascular:     Rate and Rhythm: Normal rate and regular rhythm.     Heart sounds: Normal heart sounds, S1 normal and S2 normal. No murmur heard. Pulmonary:     Effort: Pulmonary effort is normal.     Breath sounds: Rhonchi present. No wheezing or rales.     Comments: Scattered rhonchi partially clear with cough. Lymphadenopathy:     Head:     Right side of head: No submental, submandibular or tonsillar adenopathy.     Left side of head: No submental, submandibular or tonsillar adenopathy.     Cervical: No cervical adenopathy.  Psychiatric:        Behavior: Behavior is cooperative.     UC Treatments / Results  Labs (all labs ordered are listed, but only abnormal results are displayed) Labs Reviewed  SARS CORONAVIRUS 2 (TAT 6-24 HRS)  POC INFLUENZA A AND B ANTIGEN (URGENT CARE ONLY)     EKG   Radiology DG Chest 2 View  Result Date: 02/07/2021 CLINICAL DATA:  Cough for 4 days, shortness of breath EXAM: CHEST - 2 VIEW COMPARISON:  None. FINDINGS: The heart size and mediastinal contours are within normal limits. Both lungs are clear. The visualized skeletal structures are unremarkable. IMPRESSION: No active cardiopulmonary disease. Electronically Signed  By: Sharlet Salina M.D.   On: 02/07/2021 16:59    Procedures Procedures (including critical care time)  Medications Ordered in UC Medications - No data to display  Initial Impression / Assessment and Plan / UC Course  I have reviewed the triage vital signs and the nursing notes.  Pertinent labs & imaging results that were available during my care of the patient were reviewed by me and considered in my medical decision making (see chart for details).      Flu test was negative.  COVID-19 test pending.  X-ray obtained showed no acute abnormalities.  Patient has had metabolic panel within the last 3 months (had normal creatinine at 0.87 on 12/23/2020) EKG is positive and interested in starting oral antivirals.  Will not repeat metabolic panel today.  She was prescribed Tessalon to help manage cough.  Recommend she use over-the-counter medications including Mucinex and Flonase.  She is to rest and drink plenty of fluid.  Discussed alarm symptoms that warrant emergent evaluation.  Strict return precautions given to which patient expressed understanding.  Encouraged her to follow-up with her primary care provider within 1 week.  Final Clinical Impressions(s) / UC Diagnoses   Final diagnoses:  Viral URI with cough  Cough     Discharge Instructions      Your flu test was negative.  We will contact you if your COVID-19 test is positive.  Use Tessalon for cough.  Use Mucinex and Flonase for additional symptom relief.  Make sure you are drinking plenty of fluid.  If you have any worsening symptoms you need to go to the  emergency room.  Please follow-up with primary care provider early next week for reevaluation.     ED Prescriptions     Medication Sig Dispense Auth. Provider   benzonatate (TESSALON) 100 MG capsule Take 1 capsule (100 mg total) by mouth every 8 (eight) hours. 21 capsule Rosaire Cueto K, PA-C      PDMP not reviewed this encounter.   Jeani Hawking, PA-C 02/07/21 1758

## 2021-02-08 LAB — SARS CORONAVIRUS 2 (TAT 6-24 HRS): SARS Coronavirus 2: POSITIVE — AB

## 2021-02-09 ENCOUNTER — Telehealth: Payer: Self-pay

## 2021-02-09 NOTE — Telephone Encounter (Signed)
Pt was seen at [facility] on 7/16 but not treated for covid diagnosis because testing was not returned by then. Pt has a VV with PCP tomorrow to discuss treatment.

## 2021-02-09 NOTE — Telephone Encounter (Signed)
[facility] Night - Client Nonclinical Telephone Record  [facility] Client [facility] Night - Client Client [facility] - Night Physician Gweneth Dimitri- MD Contact Type Call Who Is Calling Patient / Member / Family / Caregiver Caller Name Jazlyne Gauger Caller Phone Number [phone] Patient Name Mandy Castillo Patient DOB 1941/07/13 Call Type Message Only Information Provided Reason for Call Request for General Office Information Initial Comment Caller states she needs her phy to call her back. Pt has covid and would like medication. Pt declined triage. Additional Comment provided business hours Disp. Time Disposition Final User 02/09/2021 7:18:05 AM General Information Provided Yes Lynnell Grain Call Closed By: Lynnell Grain Transaction Date/Time: 02/09/2021 7:15:18 AM (ET)

## 2021-02-09 NOTE — Telephone Encounter (Signed)
[facility] Night - Client TELEPHONE ADVICE RECORD [facility] Patient Name: Mandy Castillo Gender: Female DOB: May 16, 1941 Age: 80 Y 6 M 9 D Return Phone Number: [phone] (Primary), [phone] (Secondary) Address: City/ State/ [place]  [address] Client [facility] Night - Client Client Site [facility] [place] - Night Physician Gweneth Dimitri- MD Contact Type Call Who Is Calling Patient / Member / Family / Caregiver Call Type Triage / Clinical Relationship To Patient Self Return Phone Number [phone] (Primary) Chief Complaint Weakness, Generalized Reason for Call Symptomatic / Request for Health Information Initial Comment Caller has covid. Caller requesting medication. Caller has cough, congestion, and body pain Translation No Nurse Assessment Nurse: Noelle Penner, RN, [name] Date/Time (Eastern Time): 02/07/2021 1:49:57 PM Confirm and document reason for call. If symptomatic, describe symptoms. ---Caller states COVID + with symptoms starting 2 days ago. Cough, congestion, body aches. Does the patient have any new or worsening symptoms? ---Yes Will a triage be completed? ---Yes Related visit to physician within the last 2 weeks? ---No Does the PT have any chronic conditions? (i.e. diabetes, asthma, this includes High risk factors for pregnancy, etc.) ---Yes List chronic conditions. ---"on heart medicine" Is this a behavioral health or substance abuse call? ---No Guidelines Guideline Title Affirmed Question Affirmed Notes Nurse Date/Time (Eastern Time) COVID-19 - Diagnosed or Suspected MILD difficulty breathing (e.g., minimal/no SOB at rest, SOB with walking, pulse <100) Noelle Penner, RN, Ethan 02/07/2021 1:51:22 PM Disp. Time Lamount Cohen Time) Disposition Final User 02/07/2021 1:55:53 PM See HCP within 4 Hours (or PCP triage) Yes Noelle Penner, RN, [name] PLEASE NOTE: All timestamps contained within this  report are represented as [place] Standard Time. CONFIDENTIALTY NOTICE: This fax transmission is intended only for the addressee. It contains information that is legally privileged, confidential or otherwise protected from use or disclosure. If you are not the intended recipient, you are strictly prohibited from reviewing, disclosing, copying using or disseminating any of this information or taking any action in reliance on or regarding this information. If you have received this fax in error, please notify us immediately by telephone so that we can arrange for its return to [place]. Phone: [phone], Toll-Free: [phone], Fax: [phone] Page: 2 of 2 Call Id: [id number] Caller Disagree/Comply Comply Caller Understands Yes PreDisposition InappropriateToAsk Care Advice Given Per Guideline SEE HCP (OR PCP TRIAGE) WITHIN 4 HOURS: * IF OFFICE WILL BE OPEN: You need to be seen within the next 3 or 4 hours. Call your doctor (or NP/PA) now or as soon as the office opens. * You become worse CARE ADVICE given per COVID-19 - DIAGNOSED OR SUSPECTED (Adult) guideline. CALL BACK IF: * Fever: For fever over 101 F (38.3 C), take acetaminophen every 4 to 6 hours (Adults 650 mg) OR ibuprofen every 6 to 8 hours (Adults 400 mg). Before taking any medicine, read all the instructions on the package. Do not take aspirin unless your doctor has prescribed it for you. * Muscle aches, headache, and other pains: Often this comes and goes with the fever. Take acetaminophen every 4 to 6 hours (Adults 650 mg) OR ibuprofen every 6 to 8 hours (Adults 400 mg). Before taking any medicine, read all the instructions on the package. Referrals [facility] [place] - UC

## 2021-02-10 ENCOUNTER — Telehealth (INDEPENDENT_AMBULATORY_CARE_PROVIDER_SITE_OTHER): Payer: Medicare Other | Admitting: Family Medicine

## 2021-02-10 ENCOUNTER — Encounter: Payer: Self-pay | Admitting: Family Medicine

## 2021-02-10 VITALS — BP 126/70 | Temp 97.8°F | Ht 68.0 in | Wt 172.0 lb

## 2021-02-10 DIAGNOSIS — U071 COVID-19: Secondary | ICD-10-CM | POA: Diagnosis not present

## 2021-02-10 MED ORDER — NIRMATRELVIR/RITONAVIR (PAXLOVID)TABLET: 3.0000 | ORAL_TABLET | Freq: Two times a day (BID) | ORAL | 0 refills | Status: AC

## 2021-02-10 NOTE — Progress Notes (Signed)
Virtual Visit via Telephone Note  I connected with Mandy Castillo on 02/10/21 at 12:00 PM EDT by telephone and verified that I am speaking with the correct person using two identifiers.   I discussed the limitations, risks, security and privacy concerns of performing an evaluation and management service by telephone and the availability of in person appointments. I also discussed with the patient that there may be a patient responsible charge related to this service. The patient expressed understanding and agreed to proceed.  Patient location: Home Provider Location: [place] Participants: Lynnda Child and Mandy Castillo   History of Present Illness: Chief Complaint  Patient presents with   Covid Positive   Cough    White phlem   Generalized Body Aches   Fatigue    HPI  #Covid infection - symptoms 08/09/2020 - cough with white phlem - went to UC and given cough medication which is helping - also some sweats which has improved - feeling better over all - fatigue and staying in bed - but more energy  - UC did not prescribe the antiviral medication - no fevers recently   ROS    Observations/Objective: BP 126/70   Temp 97.8 F (36.6 C)   Ht 5\' 8"  (1.727 m)   Wt 172 lb (78 kg)   BMI 26.15 kg/m   Phone visit:  Patient speaking in complete sentences No distress Alert and oriented Normal mood  Assessment and Plan: Problem List Items Addressed This Visit   None Visit Diagnoses     COVID-19 virus infection    -  Primary   Relevant Medications   nirmatrelvir/ritonavir EUA (PAXLOVID) TABS      Pt high risk for severe covid - age, HTN Discussed paxlovid and her kidney/liver function are normal w/ no drug interactions Sent to pharmacy ER precautions   Follow Up Instructions:  Return if symptoms worsen or fail to improve.   I discussed the assessment and treatment plan with the patient. The patient was provided an opportunity to ask questions and all  were answered. The patient agreed with the plan and demonstrated an understanding of the instructions.   The patient was advised to call back or seek an in-person evaluation if the symptoms worsen or if the condition fails to improve as anticipated.  I provided 8 minutes of non-face-to-face time during this encounter.   , MD

## 2021-02-10 NOTE — Telephone Encounter (Signed)
See encounter from today.

## 2021-03-10 DIAGNOSIS — M1711 Unilateral primary osteoarthritis, right knee: Secondary | ICD-10-CM | POA: Diagnosis not present

## 2021-03-10 DIAGNOSIS — M1712 Unilateral primary osteoarthritis, left knee: Secondary | ICD-10-CM | POA: Diagnosis not present

## 2021-04-22 DIAGNOSIS — M25562 Pain in left knee: Secondary | ICD-10-CM | POA: Diagnosis not present

## 2021-04-22 DIAGNOSIS — R413 Other amnesia: Secondary | ICD-10-CM | POA: Diagnosis not present

## 2021-04-22 DIAGNOSIS — M25561 Pain in right knee: Secondary | ICD-10-CM | POA: Diagnosis not present

## 2021-05-18 ENCOUNTER — Telehealth: Payer: Self-pay

## 2021-05-18 NOTE — Telephone Encounter (Signed)
Agree with evaluation and plan per triage

## 2021-05-18 NOTE — Telephone Encounter (Signed)
Unable to reach pt at either contact #; recording said to try call later; call cannot be completed at this time. I tried call again and I spoke with pt; pt has been having abd pain for 2 1/2 days. Pt said having mid abd pain at waistline that the pain is dull and heavy pain; pain level now is 7. Also hurting rt and lt side of abd at waist line if bends over and that pain is sharpe. Pt not having fever and has never had this type pain before. Pt feels sick on stomach but does not feel nauseated? Pt said she has not had any burning or pain upon urination and no diarrhea or constipation.pt said last night she felt like she needed to urinate but went to bathroom and could not urinate like she was plugged off. Pt said she laid back down and after a while she tried to urinate again and urine "came out like a flood". Today pt said she is not voiding as much as usual. Pt had not felt like eating or drinking the last couple of days. Pt did not eat or drink a lot yesterday and today  pt has eaten cereal,yogurt and drank some boost today. Pt said her mouth feels dry and pt was dizzy on 05/17/21. Pt said she would go to [facility] ED this evening. Sending note to Dr Selena Batten and [facility] CMA. Will also teams [place].

## 2021-05-18 NOTE — Telephone Encounter (Signed)
[facility] Day - Client TELEPHONE ADVICE RECORD [facility] Patient Name: Mandy Castillo [place] Gender: Female DOB: [birth year] Age: 80 Y 9 M 17 D Return Phone Number: [phone] (Primary), [phone] (Secondary) Address: City/ State/ [place]  [address] Client [facility] Day - Client Client Site [facility] - Day Physician Gweneth Dimitri- MD Contact Type Call Who Is Calling Patient / Member / Family / Caregiver Call Type Triage / Clinical Relationship To Patient Self Return Phone Number [phone] (Primary) Chief Complaint Abdominal Pain Reason for Call Symptomatic / Request for Health Information Initial Comment Caller states she is calling because she has abd pain and an upset stomach. She has been having some muscle cramps as well. It is not constant. Translation No Disp. Time Lamount Cohen Time) Disposition Final User 05/18/2021 11:50:36 AM Attempt made - message left D'Heur Ezzard Standing, RN, Hansel Starling 05/18/2021 12:18:55 PM Attempt made - message left D'Heur Ezzard Standing, RN, Hansel Starling 05/18/2021 12:30:54 PM FINAL ATTEMPT MADE - message left D'Heur Ezzard Standing, RN, Hansel Starling 05/18/2021 12:31:20 PM Send to RN Final Attempt Queue D'Heur Ezzard Standing, RN, Adrienne 05/18/2021 2:03:04 PM Attempt made - message left Berna Bue, RN, Megan 05/18/2021 2:20:57 PM FINAL ATTEMPT MADE - message left Yes Berna Bue, RN, Megan Comments User: Ernesta Amble, RN Date/Time Lamount Cohen Time): 05/18/2021 2:20:49 PM Unable to leave voicemail with primary phone number, only secondar

## 2021-05-21 ENCOUNTER — Ambulatory Visit: Payer: Medicare Other | Admitting: Family Medicine

## 2021-05-26 DIAGNOSIS — M25562 Pain in left knee: Secondary | ICD-10-CM | POA: Diagnosis not present

## 2021-05-26 DIAGNOSIS — M1711 Unilateral primary osteoarthritis, right knee: Secondary | ICD-10-CM | POA: Diagnosis not present

## 2021-05-26 DIAGNOSIS — M1712 Unilateral primary osteoarthritis, left knee: Secondary | ICD-10-CM | POA: Diagnosis not present

## 2021-05-26 DIAGNOSIS — M25561 Pain in right knee: Secondary | ICD-10-CM | POA: Diagnosis not present

## 2021-05-28 ENCOUNTER — Ambulatory Visit (INDEPENDENT_AMBULATORY_CARE_PROVIDER_SITE_OTHER): Payer: Medicare Other | Admitting: Family Medicine

## 2021-05-28 ENCOUNTER — Other Ambulatory Visit: Payer: Self-pay

## 2021-05-28 VITALS — BP 158/90 | HR 60 | Temp 96.1°F | Wt 178.2 lb

## 2021-05-28 DIAGNOSIS — I10 Essential (primary) hypertension: Secondary | ICD-10-CM

## 2021-05-28 DIAGNOSIS — G3184 Mild cognitive impairment, so stated: Secondary | ICD-10-CM | POA: Diagnosis not present

## 2021-05-28 DIAGNOSIS — M25562 Pain in left knee: Secondary | ICD-10-CM | POA: Diagnosis not present

## 2021-05-28 DIAGNOSIS — M25561 Pain in right knee: Secondary | ICD-10-CM

## 2021-05-28 DIAGNOSIS — R109 Unspecified abdominal pain: Secondary | ICD-10-CM | POA: Diagnosis not present

## 2021-05-28 DIAGNOSIS — G8929 Other chronic pain: Secondary | ICD-10-CM

## 2021-05-28 NOTE — Assessment & Plan Note (Signed)
Resolved by time of visit. Improved with burping and associated with some reflux. ?reflux. If recurs advised trial of omeprazole.

## 2021-05-28 NOTE — Patient Instructions (Addendum)
No longer need colon cancer screening  #Pressure/burping belly pain - if it happens again - consider omeprazole 20 mg for 2 weeks  #Blood pressure - please check blood pressure 1-2 times a week - if >150/90 regularly - make follow-up visit   To check your blood pressure 1) Sit in a quiet and relaxed place for 5 minutes 2) Make sure your feet are flat on the ground 3) Consider checking first thing in the morning   Normal blood pressure is less than 140/90 Ideally you blood pressure should be around 120/80  Other ways you can reduce your blood pressure:  1) Regular exercise -- Try to get 150 minutes (30 minutes, 5 days a week) of moderate to vigorous aerobic excercise -- Examples: brisk walking (2.5 miles per hour), water aerobics, dancing, gardening, tennis, biking slower than 10 miles per hour 2) DASH Diet - low fat meats, more fresh fruits and vegetables, whole grains, low salt 3) Quit smoking if you smoke 4) Loose 5-10% of your body weight

## 2021-05-28 NOTE — Progress Notes (Signed)
Subjective:     Mandy Castillo is a 80 y.o. female presenting for Abdominal Pain (Pt states it wasn't a sharp pain, more like a "catch" that was on and off on lower left and right side x 2 weeks. /Upset stomach was described as non-stop pressure right above navel where she felt full)     Abdominal Pain   #Abdominal pain - has resolved - pressure sensation on the upper abdomen - no burning - some reflux - some burping and belching made it better - no trouble swallow  Question about colon cancer screening  #Knee pain - going to ortho for injections every few months - not waking her up a night or severe pain - is able to walk w/o issues - worse with prolonged sitting - considering knee replacement surgery  #HTN - taking metoprolol 50 mg - home monitoring - was high 2 days ago 150/87 - no cp, sob, ha   Review of Systems  Gastrointestinal:  Positive for abdominal pain.   05/18/2021: Phone - abdominal pain x 2 days - some urine retention which improved, then signs of dehydration - advised ER - unable to see in chart  Social History   Tobacco Use  Smoking Status Never  Smokeless Tobacco Never        Objective:    BP Readings from Last 3 Encounters:  05/28/21 (!) 158/90  02/10/21 126/70  02/07/21 (!) 159/94   Wt Readings from Last 3 Encounters:  05/28/21 178 lb 4 oz (80.9 kg)  02/10/21 172 lb (78 kg)  12/23/20 173 lb 8 oz (78.7 kg)    BP (!) 158/90   Pulse 60   Temp (!) 96.1 F (35.6 C) (Temporal)   Wt 178 lb 4 oz (80.9 kg)   SpO2 97%   BMI 27.10 kg/m    Physical Exam Constitutional:      General: She is not in acute distress.    Appearance: She is well-developed. She is not diaphoretic.  HENT:     Right Ear: External ear normal.     Left Ear: External ear normal.  Eyes:     Conjunctiva/sclera: Conjunctivae normal.  Cardiovascular:     Rate and Rhythm: Normal rate and regular rhythm.     Heart sounds: No murmur heard. Pulmonary:      Effort: Pulmonary effort is normal. No respiratory distress.     Breath sounds: Normal breath sounds. No wheezing.  Abdominal:     General: Abdomen is flat. Bowel sounds are normal. There is no distension.     Palpations: Abdomen is soft.     Tenderness: There is no abdominal tenderness. There is no guarding or rebound.  Musculoskeletal:     Cervical back: Neck supple.  Skin:    General: Skin is warm and dry.     Capillary Refill: Capillary refill takes less than 2 seconds.  Neurological:     Mental Status: She is alert. Mental status is at baseline.  Psychiatric:        Mood and Affect: Mood normal.        Behavior: Behavior normal.          Assessment & Plan:   Problem List Items Addressed This Visit       Cardiovascular and Mediastinum   Essential hypertension - Primary    BP slightly elevated. Cont metoprolol 50 mg XL. Home monitoring and advised f/u sooner if >150/90. Otherwise return for pre-op visit based on surgery need.  Nervous and Auditory   Mild cognitive impairment with memory loss    Follows with neuro. Doing well on aricpet 10 mg. Appreciate neuro support.         Other   Bilateral chronic knee pain    Following with ortho. Considering surgery in the new year. Discussed need for HTN to be controlled and anticipate she will need Pre-op evaluation visit.       Abdominal pressure    Resolved by time of visit. Improved with burping and associated with some reflux. ?reflux. If recurs advised trial of omeprazole.         Return if symptoms worsen or fail to improve.  Lynnda Child, MD  This visit occurred during the SARS-CoV-2 public health emergency.  Safety protocols were in place, including screening questions prior to the visit, additional usage of staff PPE, and extensive cleaning of exam room while observing appropriate contact time as indicated for disinfecting solutions.

## 2021-05-28 NOTE — Assessment & Plan Note (Signed)
Following with ortho. Considering surgery in the new year. Discussed need for HTN to be controlled and anticipate she will need Pre-op evaluation visit.

## 2021-05-28 NOTE — Assessment & Plan Note (Signed)
BP slightly elevated. Cont metoprolol 50 mg XL. Home monitoring and advised f/u sooner if >150/90. Otherwise return for pre-op visit based on surgery need.

## 2021-05-28 NOTE — Assessment & Plan Note (Signed)
Follows with neuro. Doing well on aricpet 10 mg. Appreciate neuro support.

## 2021-06-04 ENCOUNTER — Other Ambulatory Visit: Payer: Self-pay

## 2021-06-04 ENCOUNTER — Ambulatory Visit: Payer: Medicare Other | Admitting: Family Medicine

## 2021-06-04 ENCOUNTER — Ambulatory Visit (INDEPENDENT_AMBULATORY_CARE_PROVIDER_SITE_OTHER): Payer: Medicare Other | Admitting: Family Medicine

## 2021-06-04 VITALS — BP 132/90 | HR 63 | Temp 97.0°F | Wt 177.1 lb

## 2021-06-04 DIAGNOSIS — R1013 Epigastric pain: Secondary | ICD-10-CM | POA: Diagnosis not present

## 2021-06-04 DIAGNOSIS — R350 Frequency of micturition: Secondary | ICD-10-CM | POA: Insufficient documentation

## 2021-06-04 LAB — POC URINALSYSI DIPSTICK (AUTOMATED)
Bilirubin, UA: NEGATIVE
Blood, UA: NEGATIVE
Glucose, UA: NEGATIVE
Ketones, UA: NEGATIVE
Leukocytes, UA: NEGATIVE
Nitrite, UA: NEGATIVE
Protein, UA: NEGATIVE
Spec Grav, UA: 1.01 (ref 1.010–1.025)
Urobilinogen, UA: 0.2 E.U./dL
pH, UA: 7.5 (ref 5.0–8.0)

## 2021-06-04 LAB — CBC
HCT: 42.5 % (ref 36.0–46.0)
Hemoglobin: 14.2 g/dL (ref 12.0–15.0)
MCHC: 33.3 g/dL (ref 30.0–36.0)
MCV: 89.4 fl (ref 78.0–100.0)
Platelets: 313 10*3/uL (ref 150.0–400.0)
RBC: 4.75 Mil/uL (ref 3.87–5.11)
RDW: 13.8 % (ref 11.5–15.5)
WBC: 6 10*3/uL (ref 4.0–10.5)

## 2021-06-04 LAB — COMPREHENSIVE METABOLIC PANEL
ALT: 19 U/L (ref 0–35)
AST: 18 U/L (ref 0–37)
Albumin: 4.4 g/dL (ref 3.5–5.2)
Alkaline Phosphatase: 85 U/L (ref 39–117)
BUN: 10 mg/dL (ref 6–23)
CO2: 33 mEq/L — ABNORMAL HIGH (ref 19–32)
Calcium: 9.8 mg/dL (ref 8.4–10.5)
Chloride: 102 mEq/L (ref 96–112)
Creatinine, Ser: 0.79 mg/dL (ref 0.40–1.20)
GFR: 69.46 mL/min (ref >60.00)
Glucose, Bld: 95 mg/dL (ref 70–99)
Potassium: 4.1 mEq/L (ref 3.5–5.1)
Sodium: 140 mEq/L (ref 135–145)
Total Bilirubin: 0.6 mg/dL (ref 0.2–1.2)
Total Protein: 7.1 g/dL (ref 6.0–8.3)

## 2021-06-04 LAB — AMYLASE: Amylase: 48 U/L (ref 27–131)

## 2021-06-04 LAB — LIPASE: Lipase: 18 U/L (ref 11.0–59.0)

## 2021-06-04 MED ORDER — OMEPRAZOLE 20 MG PO CPDR: 20.0000 mg | DELAYED_RELEASE_CAPSULE | Freq: Every day | ORAL | 1 refills | Status: DC

## 2021-06-04 NOTE — Progress Notes (Signed)
Subjective:     Mandy Castillo is a 80 y.o. female presenting for Abdominal Pain (Right sided sharp extreme pain for about 8 mins. Describes a "funny" feeling in her stomach. She states its not really like nausea but hard to explain. )     Abdominal Pain This is a new problem. The problem occurs constantly. Duration: 8 minutes. The pain is located in the epigastric region and periumbilical region. Quality: pressure. The abdominal pain does not radiate. Associated symptoms include belching, diarrhea, frequency (nocturia) and nausea. Pertinent negatives include no constipation, dysuria, fever, flatus, hematuria or vomiting. The pain is aggravated by eating. The pain is relieved by Bowel movements. She has tried nothing for the symptoms.   2-3 Soft BM daily Not drinking enough water Eating normally - snacking  Abdominal pain x 3 weeks  S/p gallbladder and appendix removal  Urinary frequency - some difficulty emptying - no incontinence - urgency - sometimes hesitancy  Review of Systems  Constitutional:  Negative for fever.  Gastrointestinal:  Positive for abdominal pain, diarrhea and nausea. Negative for constipation, flatus and vomiting.  Genitourinary:  Positive for frequency (nocturia). Negative for dysuria and hematuria.    Social History   Tobacco Use  Smoking Status Never  Smokeless Tobacco Never        Objective:    BP Readings from Last 3 Encounters:  06/04/21 132/90  05/28/21 (!) 158/90  02/10/21 126/70   Wt Readings from Last 3 Encounters:  06/04/21 177 lb 2 oz (80.3 kg)  05/28/21 178 lb 4 oz (80.9 kg)  02/10/21 172 lb (78 kg)    BP 132/90   Pulse 63   Temp (!) 97 F (36.1 C) (Temporal)   Wt 177 lb 2 oz (80.3 kg)   SpO2 99%   BMI 26.93 kg/m    Physical Exam Constitutional:      General: She is not in acute distress.    Appearance: She is well-developed. She is not diaphoretic.  HENT:     Right Ear: External ear normal.     Left Ear:  External ear normal.     Nose: Nose normal.  Eyes:     Conjunctiva/sclera: Conjunctivae normal.  Cardiovascular:     Rate and Rhythm: Normal rate and regular rhythm.     Heart sounds: No murmur heard. Pulmonary:     Effort: Pulmonary effort is normal. No respiratory distress.     Breath sounds: Normal breath sounds. No wheezing.  Abdominal:     General: Abdomen is flat. Bowel sounds are normal. There is no distension.     Palpations: Abdomen is soft.     Tenderness: There is abdominal tenderness in the right upper quadrant, epigastric area and periumbilical area. There is no right CVA tenderness, left CVA tenderness, guarding or rebound. Positive signs include Murphy's sign (questionable+ as pt also with difficulty taking deep breath while laying flat).  Musculoskeletal:     Cervical back: Neck supple.  Skin:    General: Skin is warm and dry.     Capillary Refill: Capillary refill takes less than 2 seconds.  Neurological:     Mental Status: She is alert. Mental status is at baseline.  Psychiatric:        Mood and Affect: Mood normal.        Behavior: Behavior normal.    UA: negative LE and neg nitrites      Assessment & Plan:   Problem List Items Addressed This Visit  Other   Epigastric abdominal pain - Primary    Suspect this may be reflux. Advised trial of omeprazole. But given some tenderness will also evaluate for liver/pancrease pathology. Pt has had GB and appendix removed. Advised f/u visit is no improvement to consider imaging.       Relevant Medications   omeprazole (PRILOSEC) 20 MG capsule   Other Relevant Orders   Comprehensive metabolic panel   Amylase   Lipase   CBC   Urinary frequency    UA negative for infection. Based on symptoms could be overactive bladder vs possible retention as notes difficulty emptying the bladder as we as initiating. Discussed urology referral for further evaluation.       Relevant Orders   Ambulatory referral to Urology    POCT Urinalysis Dipstick (Automated) (Completed)     Return if symptoms worsen or fail to improve.  Lynnda Child, MD  This visit occurred during the SARS-CoV-2 public health emergency.  Safety protocols were in place, including screening questions prior to the visit, additional usage of staff PPE, and extensive cleaning of exam room while observing appropriate contact time as indicated for disinfecting solutions.

## 2021-06-04 NOTE — Assessment & Plan Note (Signed)
Suspect this may be reflux. Advised trial of omeprazole. But given some tenderness will also evaluate for liver/pancrease pathology. Pt has had GB and appendix removed. Advised f/u visit is no improvement to consider imaging.

## 2021-06-04 NOTE — Patient Instructions (Addendum)
#  Abdominal pain - start taking omeprazole 20 mg daily - take for 2 weeks - labs today to evaluate for other causes - If symptoms do not resolve with omeprazole make a follow-up appointment to get re-evaluated  #Urinary symptoms - referral to urology  - bring back urine sample

## 2021-06-04 NOTE — Assessment & Plan Note (Signed)
UA negative for infection. Based on symptoms could be overactive bladder vs possible retention as notes difficulty emptying the bladder as we as initiating. Discussed urology referral for further evaluation.

## 2021-06-16 NOTE — Progress Notes (Signed)
06/19/21 10:36 AM   Mandy Castillo 1940/10/26 [id number]  Referring provider:  Lesleigh Noe, MD [place],  [place] [address] Chief Complaint  Patient presents with   Urinary Frequency     HPI: Mandy Castillo is a 80 y.o.female who presents today for further evaluation of frequent urination.   She was seen by her PCP with urinary symptoms consisting of difficulty urinating, no incontinence, urgency, and sometimes hesitancy. This was relatively acute onset about 4-6 weeks ago without improvement.  No dysuria or gross hematuria.  UA at PCP negative.    She does have history of mild cognitive impairment on aricept avoid anticholingeric medication.   She reports today that she does not have accidents and that she has been waking up during the night to urinate. She has trouble starting her stream. She has not had in constipation.   Denies any vaginal symptoms.     PMH: Past Medical History:  Diagnosis Date   Sebaceous cyst 07/24/2020    Surgical History: Past Surgical History:  Procedure Laterality Date   ABDOMINAL HYSTERECTOMY     APPENDECTOMY     BREAST SURGERY     cyst removed   CHOLECYSTECTOMY      Home Medications:  Allergies as of 06/17/2021   No Known Allergies      Medication List        Accurate as of June 17, 2021 11:59 PM. If you have any questions, ask your nurse or doctor.          aspirin EC 81 MG tablet Take 1 tablet (81 mg total) by mouth daily.   B-12 PO Take by mouth daily.   benzonatate 100 MG capsule Commonly known as: TESSALON Take 1 capsule (100 mg total) by mouth every 8 (eight) hours. What changed:  when to take this reasons to take this   D3 PO Take by mouth daily.   donepezil 10 MG tablet Commonly known as: ARICEPT Take 10 mg by mouth at bedtime.   metoprolol succinate 50 MG 24 hr tablet Commonly known as: TOPROL-XL TAKE 1 TABLET (50 MG TOTAL) BY MOUTH DAILY. TAKE WITH OR IMMEDIATELY FOLLOWING A  MEAL.   omeprazole 20 MG capsule Commonly known as: PRILOSEC Take 1 capsule (20 mg total) by mouth daily.        Allergies: No Known Allergies  Family History: Family History  Problem Relation Age of Onset   Stroke Mother 34   Prostate cancer Father     Social History:  reports that she has never smoked. She has never used smokeless tobacco. She reports current alcohol use. She reports that she does not use drugs.   Physical Exam: BP (!) 159/92   Pulse 83   Ht 5\' 8"  (1.727 m)   BMI 26.93 kg/m   Constitutional:  Alert and oriented, No acute distress. HEENT: [place] AT, moist mucus membranes.  Trachea midline, no masses. Cardiovascular: No clubbing, cyanosis, or edema. Respiratory: Normal respiratory effort, no increased work of breathing. Skin: No rashes, bruises or suspicious lesions. Neurologic: Grossly intact, no focal deficits, moving all 4 extremities. Psychiatric: Normal mood and affect.  Laboratory Data:  Lab Results  Component Value Date   CREATININE 0.79 06/04/2021    Pertinent Imaging:  Results for orders placed or performed in visit on 06/17/21  Microscopic Examination   Urine  Result Value Ref Range   WBC, UA 0-5 0 - 5 /hpf   RBC None seen 0 - 2 /  hpf   Epithelial Cells (non renal) 0-10 0 - 10 /hpf   Bacteria, UA Few None seen/Few  Urinalysis, Complete  Result Value Ref Range   Specific Gravity, UA 1.015 1.005 - 1.030   pH, UA 5.5 5.0 - 7.5   Color, UA Yellow Yellow   Appearance Ur Hazy (A) Clear   Leukocytes,UA Negative Negative   Protein,UA Negative Negative/Trace   Glucose, UA Negative Negative   Ketones, UA Trace (A) Negative   RBC, UA Negative Negative   Bilirubin, UA Negative Negative   Urobilinogen, Ur 0.2 0.2 - 1.0 mg/dL   Nitrite, UA Negative Negative   Microscopic Examination See below:   Bladder Scan (Post Void Residual) in office  Result Value Ref Range   Scan Result 20      Assessment & Plan:   Urinary frequency - She was  give oral medication for OAB. Given samples of Gemtesa 75 mg x 4 weeks.   -Adequate bladder emptying without concern for infection  - Recommend cystoscopy/ pelvic exam to rule out any underlying issues given relative acute onset.Discussed the cystoscopy in detail and the risk.   Return for cystoscopy / pelvic exam  Tawni Millers as a scribe for Vanna Scotland, MD.,have documented all relevant documentation on the behalf of Vanna Scotland, MD,as directed by  Vanna Scotland, MD while in the presence of Vanna Scotland, MD.   [facility] [address] [place], [place] [address] [phone]

## 2021-06-17 ENCOUNTER — Ambulatory Visit (INDEPENDENT_AMBULATORY_CARE_PROVIDER_SITE_OTHER): Payer: Medicare Other | Admitting: Urology

## 2021-06-17 ENCOUNTER — Other Ambulatory Visit: Payer: Self-pay

## 2021-06-17 ENCOUNTER — Encounter: Payer: Self-pay | Admitting: Urology

## 2021-06-17 VITALS — BP 159/92 | HR 83 | Ht 68.0 in

## 2021-06-17 DIAGNOSIS — R350 Frequency of micturition: Secondary | ICD-10-CM | POA: Diagnosis not present

## 2021-06-17 LAB — MICROSCOPIC EXAMINATION: RBC, Urine: NONE SEEN /hpf (ref 0–2)

## 2021-06-17 LAB — URINALYSIS, COMPLETE
Bilirubin, UA: NEGATIVE
Glucose, UA: NEGATIVE
Leukocytes,UA: NEGATIVE
Nitrite, UA: NEGATIVE
Protein,UA: NEGATIVE
RBC, UA: NEGATIVE
Specific Gravity, UA: 1.015 (ref 1.005–1.030)
Urobilinogen, Ur: 0.2 mg/dL (ref 0.2–1.0)
pH, UA: 5.5 (ref 5.0–7.5)

## 2021-06-17 LAB — BLADDER SCAN AMB NON-IMAGING: Scan Result: 20

## 2021-06-17 NOTE — Patient Instructions (Signed)

## 2021-06-26 ENCOUNTER — Other Ambulatory Visit: Payer: Self-pay | Admitting: Family Medicine

## 2021-06-26 DIAGNOSIS — R1013 Epigastric pain: Secondary | ICD-10-CM

## 2021-07-14 NOTE — Progress Notes (Signed)
° °  07/15/21  CC:  Chief Complaint  Patient presents with   Cysto     HPI: Mandy Castillo is a 80 y.o.female with a personal history of urinary frequency and mild cognitive impairment on aricept avoid anticholingeric medication. who presents today for diagnostic cystoscopy.   She reports today that Gemtesa samples improved her urinary symptoms.   Vitals:   07/15/21 0840  BP: (!) 187/83  Pulse: 67  NED. A&Ox3.   No respiratory distress   Abd soft, NT, ND Normal external genitalia with patent urethral meatus  Cystoscopy Procedure Note  Patient identification was confirmed, informed consent was obtained, and patient was prepped using Betadine solution.  Lidocaine jelly was administered per urethral meatus.    Procedure: - Flexible cystoscope introduced, without any difficulty.   - Thorough search of the bladder revealed:    normal urethral meatus    normal urothelium    no stones    no ulcers     no tumors    no urethral polyps    mild trabeculation  - Ureteral orifices were normal in position and appearance.  Post-Procedure: - Patient tolerated the procedure well  Pelvic exam: Excellent vaginal wall support no evidence of bladder or vaginal prolapse   Assessment/ Plan:  OAB/ urgency  - Doing extremely well on Gemtesa.  - Cystoscopy was unremarkable today  -Prescription of Gemtesa filled. - she was given a one time dose of antibiotics to prevent UTI      Return in 1 year with PA for OAB   I,Kailey Littlejohn,acting as a scribe for Vanna Scotland, MD.,have documented all relevant documentation on the behalf of Vanna Scotland, MD,as directed by  Vanna Scotland, MD while in the presence of Vanna Scotland, MD.  I have reviewed the above documentation for accuracy and completeness, and I agree with the above.   Vanna Scotland, MD

## 2021-07-15 ENCOUNTER — Other Ambulatory Visit: Payer: Self-pay | Admitting: Family Medicine

## 2021-07-15 ENCOUNTER — Ambulatory Visit (INDEPENDENT_AMBULATORY_CARE_PROVIDER_SITE_OTHER): Payer: Medicare Other | Admitting: Urology

## 2021-07-15 ENCOUNTER — Other Ambulatory Visit: Payer: Self-pay

## 2021-07-15 ENCOUNTER — Encounter: Payer: Self-pay | Admitting: Urology

## 2021-07-15 VITALS — BP 187/83 | HR 67 | Ht 68.0 in | Wt 177.0 lb

## 2021-07-15 DIAGNOSIS — R1013 Epigastric pain: Secondary | ICD-10-CM

## 2021-07-15 DIAGNOSIS — R350 Frequency of micturition: Secondary | ICD-10-CM

## 2021-07-15 LAB — URINALYSIS, COMPLETE
Bilirubin, UA: NEGATIVE
Glucose, UA: NEGATIVE
Ketones, UA: NEGATIVE
Leukocytes,UA: NEGATIVE
Nitrite, UA: NEGATIVE
Protein,UA: NEGATIVE
RBC, UA: NEGATIVE
Specific Gravity, UA: 1.02 (ref 1.005–1.030)
Urobilinogen, Ur: 0.2 mg/dL (ref 0.2–1.0)
pH, UA: 7 (ref 5.0–7.5)

## 2021-07-15 LAB — MICROSCOPIC EXAMINATION

## 2021-07-15 MED ORDER — GEMTESA 75 MG PO TABS: 75.0000 mg | ORAL_TABLET | Freq: Every day | ORAL | 11 refills | Status: DC

## 2021-07-15 MED ORDER — CEPHALEXIN 250 MG PO CAPS
500.0000 mg | ORAL_CAPSULE | Freq: Once | ORAL | Status: AC
  Administered 2021-07-15: 500 mg via ORAL

## 2021-08-12 ENCOUNTER — Other Ambulatory Visit: Payer: Self-pay | Admitting: Family Medicine

## 2021-08-12 DIAGNOSIS — R1013 Epigastric pain: Secondary | ICD-10-CM

## 2021-08-18 ENCOUNTER — Other Ambulatory Visit: Payer: Self-pay | Admitting: Family Medicine

## 2021-08-18 DIAGNOSIS — R1013 Epigastric pain: Secondary | ICD-10-CM

## 2021-08-19 ENCOUNTER — Telehealth: Payer: Self-pay | Admitting: *Deleted

## 2021-08-19 ENCOUNTER — Ambulatory Visit (INDEPENDENT_AMBULATORY_CARE_PROVIDER_SITE_OTHER)
Admission: RE | Admit: 2021-08-19 | Discharge: 2021-08-19 | Disposition: A | Discharge status: Home / Self Care | Payer: Medicare Other | Source: Ambulatory Visit | Attending: Family Medicine | Admitting: Family Medicine

## 2021-08-19 ENCOUNTER — Ambulatory Visit (INDEPENDENT_AMBULATORY_CARE_PROVIDER_SITE_OTHER): Payer: Medicare Other | Admitting: Family Medicine

## 2021-08-19 ENCOUNTER — Encounter: Payer: Self-pay | Admitting: Family Medicine

## 2021-08-19 ENCOUNTER — Other Ambulatory Visit: Payer: Self-pay

## 2021-08-19 VITALS — BP 130/70 | HR 84 | Temp 98.0°F | Ht 68.0 in | Wt 178.4 lb

## 2021-08-19 DIAGNOSIS — M545 Low back pain, unspecified: Secondary | ICD-10-CM | POA: Diagnosis not present

## 2021-08-19 DIAGNOSIS — W19XXXA Unspecified fall, initial encounter: Secondary | ICD-10-CM

## 2021-08-19 DIAGNOSIS — R1013 Epigastric pain: Secondary | ICD-10-CM

## 2021-08-19 DIAGNOSIS — M2578 Osteophyte, vertebrae: Secondary | ICD-10-CM | POA: Diagnosis not present

## 2021-08-19 DIAGNOSIS — S32010A Wedge compression fracture of first lumbar vertebra, initial encounter for closed fracture: Secondary | ICD-10-CM | POA: Diagnosis not present

## 2021-08-19 DIAGNOSIS — M5136 Other intervertebral disc degeneration, lumbar region: Secondary | ICD-10-CM | POA: Diagnosis not present

## 2021-08-19 MED ORDER — TRAMADOL HCL 50 MG PO TABS: 50.0000 mg | ORAL_TABLET | Freq: Three times a day (TID) | ORAL | 0 refills | Status: DC | PRN

## 2021-08-19 MED ORDER — OMEPRAZOLE 20 MG PO CPDR: 20.0000 mg | DELAYED_RELEASE_CAPSULE | Freq: Every day | ORAL | 1 refills | Status: DC

## 2021-08-19 NOTE — Telephone Encounter (Signed)
PLEASE NOTE: All timestamps contained within this report are represented as [place] Standard Time. CONFIDENTIALTY NOTICE: This fax transmission is intended only for the addressee. It contains information that is legally privileged, confidential or otherwise protected from use or disclosure. If you are not the intended recipient, you are strictly prohibited from reviewing, disclosing, copying using or disseminating any of this information or taking any action in reliance on or regarding this information. If you have received this fax in error, please notify us immediately by telephone so that we can arrange for its return to [place]. Phone: [phone], Toll-Free: [phone], Fax: [phone] Page: 1 of 2 Call Id: [id number] [facility] Day - Client TELEPHONE ADVICE RECORD AccessNurse Patient Name: Mandy Castillo [facility] Gender: Female DOB: 06-Jul-1941 Age: 81 Y 17 D Return Phone Number: [phone] (Primary) Address: City/ [place]/ [place]  [address] Client [facility] Day - Client Client Site [facility] - Day Provider Gweneth Dimitri- MD Contact Type Call Who Is Calling Patient / Member / Family / Caregiver Call Type Triage / Clinical Relationship To Patient Self Return Phone Number [phone] (Primary) Chief Complaint Back Injury Reason for Call Symptomatic / Request for Health Information Initial Comment Caller states pt fell and injured her back. Translation No Nurse Assessment Nurse: Elijah Birk, RN, Vernona Rieger Date/Time Lamount Cohen Time): 08/18/2021 5:05:29 PM Confirm and document reason for call. If symptomatic, describe symptoms. ---Caller states pt fell and injured her back. Happened yesterday. Does the patient have any new or worsening symptoms? ---Yes Will a triage be completed? ---Yes Related visit to physician within the last 2 weeks? ---No Does the PT have any chronic conditions? (i.e. diabetes, asthma,  this includes High risk factors for pregnancy, etc.) ---Yes List chronic conditions. ---Heart condition, Memory meds Is this a behavioral health or substance abuse call? ---No Guidelines Guideline Title Affirmed Question Affirmed Notes Nurse Date/Time (Eastern Time) Back Injury [1] Landed hard on feet or buttocks AND [2] pain over spine Mallie Darting 08/18/2021 5:07:25 PM Disp. Time Lamount Cohen Time) Disposition Final User 08/18/2021 5:15:04 PM See HCP within 4 Hours (or PCP triage) Yes Elijah Birk, RN, Durenda Guthrie Disagree/Comply Disagree PLEASE NOTE: All timestamps contained within this report are represented as [place] Standard Time. CONFIDENTIALTY NOTICE: This fax transmission is intended only for the addressee. It contains information that is legally privileged, confidential or otherwise protected from use or disclosure. If you are not the intended recipient, you are strictly prohibited from reviewing, disclosing, copying using or disseminating any of this information or taking any action in reliance on or regarding this information. If you have received this fax in error, please notify us immediately by telephone so that we can arrange for its return to [place]. Phone: [phone], Toll-Free: [phone], Fax: [phone] Page: 2 of 2 Call Id: [id number] Caller Understands Yes PreDisposition Home Care Care Advice Given Per Guideline SEE HCP (OR PCP TRIAGE) WITHIN 4 HOURS: PAIN MEDICINES: CALL BACK IF: * You become worse CARE ADVICE given per Back Injury (Adult) guideline. Referrals GO TO FACILITY REFUSED

## 2021-08-19 NOTE — Progress Notes (Signed)
Mandy Masella T. Areon Cocuzza, MD, CAQ [facility] [address] [place], [address]  Phone: [phone]   FAX: [phone]  Mandy Castillo - 81 y.o. female   MRN [id number]   Date of Birth: 1941-02-02  Date: 08/19/2021   PCP: Lynnda Child, MD   Referral: Lynnda Child, MD  Chief Complaint  Patient presents with   Fall    On Monday   Back Pain    Low    This visit occurred during the SARS-CoV-2 public health emergency.  Safety protocols were in place, including screening questions prior to the visit, additional usage of staff PPE, and extensive cleaning of exam room while observing appropriate contact time as indicated for disinfecting solutions.   Subjective:   Mandy Castillo is a 81 y.o. very pleasant female patient with Body mass index is 27.12 kg/m. who presents with the following:  Pleasant patient of Dr. Elmyra Ricks who fell on Monday and hurt her back.  Reports 7/10 pain.  She is standing on my entrance into the examination room.  DOI:  08/17/2021  Was going to her desk in her house office, and the came in and turned and ? L knee gave way.  Felt like she went sideways, and then fell on her bottom.    Today, it does really hurt.  It hurts worse today compared to yesterday.  She thinks a driving in the car also aggravated and it made it hurt somewhat more.  Yesterday, pressure felt good.  Now, supportive pressure does hurt worse.  At this point, she is not really driving in general and her partner does the driving. I don't see a DEXA.  Review of Systems is noted in the HPI, as appropriate  Objective:   BP 130/70    Pulse 84    Temp 98 F (36.7 C) (Temporal)    Ht 5\' 8"  (1.727 m)    Wt 178 lb 6 oz (80.9 kg)    SpO2 99%    BMI 27.12 kg/m   GEN: No acute distress; alert,appropriate. PULM: Breathing comfortably in no respiratory distress PSYCH: Normally interactive.    Range of motion at  the waist: Flexion, extension,  lateral bending and rotation: There is modest restriction in all directions.  No echymosis or edema Rises to examination table with mild difficulty Gait: minimally antalgic  Inspection/Deformity: N Paraspinus Tenderness: L3-S1 bilaterally She also does have some tenderness along the spinous processes from L3-S1. Tenderness along the posterior pelvis and at the sacrum modestly bilaterally  B Ankle Dorsiflexion (L5,4): 5/5 B Great Toe Dorsiflexion (L5,4): 5/5 Rise/Squat (L4): WNL, mild pain  SENSORY B Medial Foot (L4): WNL B Dorsum (L5): WNL B Lateral (S1): WNL Light Touch: WNL   B SLR, seated: neg Range of motion at the hip is normal B Greater Troch: NT B Sciatic Notch: Tender to palpation  Laboratory and Imaging Data: DG Lumbar Spine Complete  Result Date: 08/20/2021 CLINICAL DATA:  Evaluate for fracture.  Fall. EXAM: LUMBAR SPINE - COMPLETE 4+ VIEW COMPARISON:  None. FINDINGS: There is mild compression deformity of the superior endplate of L1 with 10% loss vertebral body height. No definite retropulsion of fracture fragments. Alignment is normal. There is mild disc space narrowing at L3-L4, L4-L5 and L5-S1. Degenerative endplate osteophytes are seen throughout. There are degenerative changes of facet joints at L5-S1. Cholecystectomy clips are present. IMPRESSION: 1. Mild compression fracture superior endplate of L1. 2. 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 Electronically Signed   By: Darliss Cheney M.D.   On: 08/20/2021 21:15     Assessment and Plan:     ICD-10-CM   1. Closed compression fracture of body of L1 vertebra (HCC)  S32.010A     2. Epigastric abdominal pain  R10.13 omeprazole (PRILOSEC) 20 MG capsule    3. Fall, initial encounter  W19.XXXA DG Lumbar Spine Complete    4. Acute bilateral low back pain without sciatica  M54.50 DG Lumbar Spine Complete     The patient and I reviewed films together face to face, and I showed her compression  fracture.  Radiology agrees.  Pain management reviewed, continue APAP, NSAIDS if needed.  Tramadol prn.  If needed, high level of opioid could be used cautiously, but she understands sedation and will be judicious in Tramadol use.  Anticipate at least 4 weeks, more likely at least 6 weeks.  Meds ordered this encounter  Medications   omeprazole (PRILOSEC) 20 MG capsule    Sig: Take 1 capsule (20 mg total) by mouth daily.    Dispense:  90 capsule    Refill:  1   traMADol (ULTRAM) 50 MG tablet    Sig: Take 1 tablet (50 mg total) by mouth every 8 (eight) hours as needed for moderate pain.    Dispense:  20 tablet    Refill:  0   Medications Discontinued During This Encounter  Medication Reason   benzonatate (TESSALON) 100 MG capsule Completed Course   omeprazole (PRILOSEC) 20 MG capsule Reorder   Orders Placed This Encounter  Procedures   DG Lumbar Spine Complete    Follow-up: prn unless complications or concerns.  [employer] One speech-to-text software was used for transcription in this dictation.  Possible transcriptional errors can occur using Animal nutritionist.   Signed,  Elpidio Galea. Abiha Lukehart, MD   Outpatient Encounter Medications as of 08/19/2021  Medication Sig   aspirin EC 81 MG tablet Take 1 tablet (81 mg total) by mouth daily.   Cholecalciferol (D3 PO) Take by mouth daily.   Cyanocobalamin (B-12 PO) Take by mouth daily.   donepezil (ARICEPT) 10 MG tablet Take 10 mg by mouth at bedtime.   metoprolol succinate (TOPROL-XL) 50 MG 24 hr tablet TAKE 1 TABLET (50 MG TOTAL) BY MOUTH DAILY. TAKE WITH OR IMMEDIATELY FOLLOWING A MEAL.   traMADol (ULTRAM) 50 MG tablet Take 1 tablet (50 mg total) by mouth every 8 (eight) hours as needed for moderate pain.   Vibegron (GEMTESA) 75 MG TABS Take 75 mg by mouth daily.   [DISCONTINUED] omeprazole (PRILOSEC) 20 MG capsule Take 1 capsule (20 mg total) by mouth daily.   omeprazole (PRILOSEC) 20 MG capsule Take 1 capsule (20 mg total) by  mouth daily.   [DISCONTINUED] benzonatate (TESSALON) 100 MG capsule Take 1 capsule (100 mg total) by mouth every 8 (eight) hours. (Patient taking differently: Take 100 mg by mouth 2 (two) times daily as needed.)   No facility-administered encounter medications on file as of 08/19/2021.

## 2021-08-19 NOTE — Telephone Encounter (Signed)
Spoke to patient by telephone and was advised that she fell Monday and injured her back. Patient stated her pain level is at a 7 at times. Patient scheduled to see Dr. Patsy Lager today 08/19/21 at 2:00 pm at [facility]. Patient had a negative covid screening.

## 2021-08-21 ENCOUNTER — Telehealth: Payer: Self-pay

## 2021-08-21 NOTE — Telephone Encounter (Signed)
I already knew about this and reviewed the film and treatment plan for a compression myself with her face to face.  Thank-you.

## 2021-08-21 NOTE — Telephone Encounter (Signed)
Cheryl from [facility] Radiology called critical reading of: Mild compression fracture of the superior implant of the L1

## 2021-09-01 DIAGNOSIS — M1712 Unilateral primary osteoarthritis, left knee: Secondary | ICD-10-CM | POA: Diagnosis not present

## 2021-09-01 DIAGNOSIS — M1711 Unilateral primary osteoarthritis, right knee: Secondary | ICD-10-CM | POA: Diagnosis not present

## 2021-09-15 ENCOUNTER — Ambulatory Visit (INDEPENDENT_AMBULATORY_CARE_PROVIDER_SITE_OTHER): Payer: Medicare Other | Admitting: Family Medicine

## 2021-09-15 ENCOUNTER — Other Ambulatory Visit: Payer: Self-pay

## 2021-09-15 VITALS — BP 120/70 | HR 61 | Temp 97.9°F | Ht 66.5 in | Wt 176.6 lb

## 2021-09-15 DIAGNOSIS — Z0000 Encounter for general adult medical examination without abnormal findings: Secondary | ICD-10-CM

## 2021-09-15 DIAGNOSIS — I4892 Unspecified atrial flutter: Secondary | ICD-10-CM

## 2021-09-15 DIAGNOSIS — G3184 Mild cognitive impairment, so stated: Secondary | ICD-10-CM

## 2021-09-15 DIAGNOSIS — R94120 Abnormal auditory function study: Secondary | ICD-10-CM

## 2021-09-15 DIAGNOSIS — L819 Disorder of pigmentation, unspecified: Secondary | ICD-10-CM | POA: Diagnosis not present

## 2021-09-15 NOTE — Assessment & Plan Note (Signed)
Doing well on Aricept 10 mg.  Screening not done today due to following with neurology.

## 2021-09-15 NOTE — Patient Instructions (Addendum)
Audiology referral for hearing - you will get a phone call   Stomach issues - can stop omeprazole - if symptoms return restart for as needed   Vaccines at the pharmacy - Tdap - Shingles vaccine    This means that she is at risk for developing osteoporosis and have some signs of low bone mass.   Would recommend the following:   1) 800 units of Vitamin D daily 2) Get 1200 mg of elemental calcium --- this is best from your diet. Try to track how much calcium you get on a typical day. You could find ways to add more (dairy products, leafy greens). Take a supplement for whatever you don't typically get so you reach 1200 mg of calcium.  3) Physical activity (ideally weight bearing) - like walking briskly 30 minutes 5 days a week.

## 2021-09-15 NOTE — Assessment & Plan Note (Addendum)
Stable.  Continue metoprolol 50 mg.

## 2021-09-15 NOTE — Progress Notes (Signed)
Subjective:   Mandy Castillo is a 81 y.o. female who presents for Medicare Annual (Subsequent) preventive examination.  Review of Systems    Review of Systems  Constitutional:  Negative for chills and fever.  HENT:  Negative for congestion and sore throat.   Eyes:  Negative for blurred vision and double vision.  Respiratory:  Negative for shortness of breath.   Cardiovascular:  Negative for chest pain.  Gastrointestinal:  Negative for heartburn, nausea and vomiting.  Genitourinary: Negative.   Musculoskeletal: Negative.  Negative for myalgias.  Skin:  Negative for rash.  Neurological:  Negative for dizziness and headaches.  Endo/Heme/Allergies:  Does not bruise/bleed easily.  Psychiatric/Behavioral:  Negative for depression. The patient is not nervous/anxious.    Cardiac Risk Factors include: advanced age (>30men, >41 women);sedentary lifestyle     Objective:    Today's Vitals   09/15/21 1142 09/15/21 1148  BP: 120/70   Pulse: 61   Temp: 97.9 F (36.6 C)   TempSrc: Oral   SpO2: 98%   Weight: 176 lb 9 oz (80.1 kg)   Height: 5' 6.5" (1.689 m)   PainSc:  5    Body mass index is 28.07 kg/m.  Advanced Directives 09/15/2021  Does Patient Have a Medical Advance Directive? No  Would patient like information on creating a medical advance directive? Yes (MAU/Ambulatory/Procedural Areas - Information given)    Current Medications (verified) Outpatient Encounter Medications as of 09/15/2021  Medication Sig   aspirin EC 81 MG tablet Take 1 tablet (81 mg total) by mouth daily.   Cholecalciferol (D3 PO) Take by mouth daily.   Cyanocobalamin (B-12 PO) Take by mouth daily.   donepezil (ARICEPT) 10 MG tablet Take 10 mg by mouth at bedtime.   metoprolol succinate (TOPROL-XL) 50 MG 24 hr tablet TAKE 1 TABLET (50 MG TOTAL) BY MOUTH DAILY. TAKE WITH OR IMMEDIATELY FOLLOWING A MEAL.   omeprazole (PRILOSEC) 20 MG capsule Take 1 capsule (20 mg total) by mouth daily.   traMADol  (ULTRAM) 50 MG tablet Take 1 tablet (50 mg total) by mouth every 8 (eight) hours as needed for moderate pain.   Vibegron (GEMTESA) 75 MG TABS Take 75 mg by mouth daily.   No facility-administered encounter medications on file as of 09/15/2021.    Allergies (verified) Patient has no known allergies.   History: Past Medical History:  Diagnosis Date   Sebaceous cyst 07/24/2020   Past Surgical History:  Procedure Laterality Date   ABDOMINAL HYSTERECTOMY     APPENDECTOMY     BREAST SURGERY     cyst removed   CHOLECYSTECTOMY     Family History  Problem Relation Age of Onset   Stroke Mother 24   Prostate cancer Father    Social History   Socioeconomic History   Marital status: Single    Spouse name: Not on file   Number of children: 0   Years of education: master's degree   Highest education level: Not on file  Occupational History   Not on file  Tobacco Use   Smoking status: Never   Smokeless tobacco: Never  Substance and Sexual Activity   Alcohol use: Yes    Comment: wine every few months   Drug use: Never   Sexual activity: Not Currently  Other Topics Concern   Not on file  Social History Narrative   04/21/20   From: [place], [place]   Living: with partner - Dorena Bodo - 2011   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, corporate and international relations   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        Family: near [place] - cousin and sister in [place]       Enjoys: reading, writing      Exercise: PT currently for knees   Diet: trying to eat healthy      Safety   Seat belts: Yes    Guns: No   Safe in relationships: Yes    Social Determinants of Corporate investment banker Strain: Not on file  Food Insecurity: Not on file  Transportation Needs: Not on file  Physical Activity: Not on file  Stress: Not on file  Social Connections: Not on file    Tobacco  Counseling Counseling given: Not Answered   Clinical Intake:  Pre-visit preparation completed: No  Pain : 0-10 Pain Score: 5  Pain Type: Acute pain Pain Location: Back Pain Orientation: Lower Pain Descriptors / Indicators: Aching Pain Onset: 1 to 4 weeks ago Pain Frequency: Intermittent Pain Relieving Factors: rest  Pain Relieving Factors: rest  BMI - recorded: 28.07 Nutritional Status: BMI 25 -29 Overweight Nutritional Risks: None Diabetes: No  How often do you need to have someone help you when you read instructions, pamphlets, or other written materials from your doctor or pharmacy?: 1 - Never What is the last grade level you completed in school?: PhD  Diabetic?no  Interpreter Needed?: No      Activities of Daily Living In your present state of health, do you have any difficulty performing the following activities: 09/15/2021 12/01/2020  Hearing? Y N  Vision? N N  Difficulty concentrating or making decisions? Y Y  Comment on treatment -  Walking or climbing stairs? N N  Dressing or bathing? N N  Doing errands, shopping? N N  Preparing Food and eating ? N -  Using the Toilet? N -  In the past six months, have you accidently leaked urine? N -  Do you have problems with loss of bowel control? N -  Managing your Medications? N -  Managing your Finances? N -  Housekeeping or managing your Housekeeping? N -  Some recent data might be hidden    Patient Care Team: Lynnda Child, MD as PCP - General (Family Medicine) Morene Crocker, MD as Referring Physician (Neurology) Jodi Geralds, MD as Consulting Physician (Orthopedic Surgery)  Indicate any recent Medical Services you may have received from other than [facility] providers in the past year (date may be approximate).     Assessment:   This is a routine wellness examination for [place].  Hearing/Vision screen Hearing Screening   250Hz  500Hz  1000Hz  2000Hz  4000Hz   Right ear 20 20 40 40 0  Left ear 20 20 20 20  20    Vision Screening   Right eye Left eye Both eyes  Without correction 20/25 20/50 20/25   With correction       Dietary issues and exercise activities discussed: Current Exercise Habits: Home exercise routine, Type of exercise: walking, Intensity: Mild, Exercise limited by: orthopedic condition(s)   Goals Addressed             This Visit's Progress    Weight (lb) < 165 lb (74.8 kg)   176 lb 9 oz (80.1 kg)    Lose weight and exercise more      Depression Screen PHQ 2/9 Scores 07/24/2020 04/21/2020  PHQ -  2 Score 0 0    Fall Risk Fall Risk  09/15/2021 07/24/2020  Falls in the past year? 1 0  Number falls in past yr: 1 0  Injury with Fall? 1 0  Comment Pt states she hurt her back -  Risk for fall due to : History of fall(s) -  Follow up - Falls evaluation completed    FALL RISK PREVENTION PERTAINING TO THE HOME:  Any stairs in or around the home? No  If so, are there any without handrails?  N/a Home free of loose throw rugs in walkways, pet beds, electrical cords, etc? Yes  Adequate lighting in your home to reduce risk of falls? Yes   ASSISTIVE DEVICES UTILIZED TO PREVENT FALLS:  Life alert? No  Use of a cane, walker or w/c? No  Grab bars in the bathroom? No  Shower chair or bench in shower? Yes  Elevated toilet seat or a handicapped toilet? No    Cognitive Function:   Montreal Cognitive Assessment  09/01/2020  Visuospatial/ Executive (0/5) 3  Naming (0/3) 1  Attention: Read list of digits (0/2) 2  Attention: Read list of letters (0/1) 1  Attention: Serial 7 subtraction starting at 100 (0/3) 2  Language: Repeat phrase (0/2) 1  Language : Fluency (0/1) 1  Abstraction (0/2) 2  Delayed Recall (0/5) 1  Orientation (0/6) 6  Total 20      Immunizations Immunization History  Administered Date(s) Administered   Fluad Quad(high Dose 65+) 07/24/2020   Influenza, High Dose Seasonal PF 05/07/2021   PFIZER(Purple Top)SARS-COV-2 Vaccination 09/06/2019,  10/01/2019, 05/14/2020, 12/11/2020   Pfizer Covid-19 Vaccine Bivalent Booster 73yrs & up 05/07/2021    TDAP status: Due, Education has been provided regarding the importance of this vaccine. Advised may receive this vaccine at local pharmacy or Health Dept. Aware to provide a copy of the vaccination record if obtained from local pharmacy or Health Dept. Verbalized acceptance and understanding.  Flu Vaccine status: Up to date  Pneumococcal vaccine status: Up to date  Covid-19 vaccine status: Information provided on how to obtain vaccines.   Qualifies for Shingles Vaccine? Yes   Zostavax completed Yes   Shingrix Completed?: No.    Education has been provided regarding the importance of this vaccine. Patient has been advised to call insurance company to determine out of pocket expense if they have not yet received this vaccine. Advised may also receive vaccine at local pharmacy or Health Dept. Verbalized acceptance and understanding.  Screening Tests Health Maintenance  Topic Date Due   TETANUS/TDAP  Never done   Zoster Vaccines- Shingrix (1 of 2) Never done   Pneumonia Vaccine 59+ Years old (1 - PCV) Never done   DEXA SCAN  Never done   INFLUENZA VACCINE  Completed   COVID-19 Vaccine  Completed   HPV VACCINES  Aged Out    Health Maintenance  Health Maintenance Due  Topic Date Due   TETANUS/TDAP  Never done   Zoster Vaccines- Shingrix (1 of 2) Never done   Pneumonia Vaccine 51+ Years old (1 - PCV) Never done   DEXA SCAN  Never done    Colorectal cancer screening: No longer required.   Mammogram status: No longer required due to age.  Bone Density status: Completed unknown. Results reflect: Bone density results: NORMAL. Repeat every per patient years.  Lung Cancer Screening: (Low Dose CT Chest recommended if Age 12-80 years, 30 pack-year currently smoking OR have quit w/in 15years.) does not qualify.  Lung Cancer Screening Referral: n/a  Additional  Screening:  Hepatitis C Screening: does qualify; Completed   Vision Screening: Recommended annual ophthalmology exams for early detection of glaucoma and other disorders of the eye. Is the patient up to date with their annual eye exam?  Yes    Dental Screening: Recommended annual dental exams for proper oral hygiene  Community Resource Referral / Chronic Care Management: CRR required this visit?  No   CCM required this visit?  No      Plan:     Problem List Items Addressed This Visit       Cardiovascular and Mediastinum   Atrial flutter (HCC)    Stable.  Continue metoprolol 50 mg.        Nervous and Auditory   Mild cognitive impairment with memory loss    Doing well on Aricept 10 mg.  Screening not done today due to following with neurology.      Other Visit Diagnoses     Encounter for Medicare annual wellness exam    -  Primary   Failed hearing screening       Relevant Orders   Ambulatory referral to Audiology   Atypical pigmented skin lesion       Relevant Orders   Ambulatory referral to Dermatology        I have personally reviewed and noted the following in the patients chart:   Medical and social history Use of alcohol, tobacco or illicit drugs  Current medications and supplements including opioid prescriptions.  Functional ability and status Nutritional status Physical activity Advanced directives List of other physicians Hospitalizations, surgeries, and ER visits in previous 12 months Vitals Screenings to include cognitive, depression, and falls Referrals and appointments  In addition, I have reviewed and discussed with patient certain preventive protocols, quality metrics, and best practice recommendations. A written personalized care plan for preventive services as well as general preventive health recommendations were provided to patient.     Lynnda ChildJessica R Dymir Neeson, MD   09/15/2021

## 2021-09-18 ENCOUNTER — Encounter: Payer: Self-pay | Admitting: *Deleted

## 2021-09-23 ENCOUNTER — Ambulatory Visit: Payer: Medicare Other | Attending: Family Medicine | Admitting: Audiologist

## 2021-09-23 DIAGNOSIS — H903 Sensorineural hearing loss, bilateral: Secondary | ICD-10-CM | POA: Insufficient documentation

## 2021-09-28 ENCOUNTER — Ambulatory Visit: Payer: Medicare Other | Admitting: Audiologist

## 2021-09-28 ENCOUNTER — Other Ambulatory Visit: Payer: Self-pay

## 2021-09-28 DIAGNOSIS — H903 Sensorineural hearing loss, bilateral: Secondary | ICD-10-CM

## 2021-09-28 NOTE — Procedures (Signed)
?  [facility] [place] ?[address] ?[place], [place]  [address] ?[phone] ? ?AUDIOLOGICAL  EVALUATION ? ?NAME: Mandy Castillo     ?DOB:   08-07-1940      ?MRN: [id number]                                                                                     ?DATE: 09/28/2021     ?REFERENT: Lesleigh Noe, MD ?STATUS: Outpatient ?DIAGNOSIS: Sensorineural Hearing Loss Bilateral  ? ? ?History: ?Mandy Castillo was seen for an audiological evaluation. ?[name] is receiving a hearing evaluation due to a referred screening with her primary care provider. [name] has difficulty hearing on the phone, otherwise she hears well. She sometimes struggles to understand accented speakers. This difficulty began gradually. No pain or pressure reported in either ear. Tinnitus present in both ears intermittently. [name] has a history of noise exposure from serving the [employer].  ?Medical history negative for condition which is a risk factor for hearing loss. No other relevant case history reported.  ? ? ?Evaluation:  ?Otoscopy showed a clear view of the tympanic membranes, bilaterally ?Tympanometry results were consistent with normal middle ear function, bilaterally   ?Audiometric testing was completed using conventional audiometry with insert transducer. Speech Recognition Thresholds were consistent with pure tone averages. Word Recognition was excellent at conversation and an elevated level. Pure tone thresholds show normal sloping to moderately severe sensorineural hearing loss in both ears. 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  ? ?Results:  ?The test results were reviewed with [name]. She has a mild hearing loss across most speech frequencies sloping to a moderate hearing loss at the highest frequencies. This means she will miss high pitched sounds like /s/ and /th/. These sounds are visible on the face. When someone speaks to her face to face within five feet she will not struggle to hear because she  can fill in this missing sounds with lipreading. She will struggle to hear on the phone since the sound quality is poor and she cannot lipread. Due to her excellent speech understanding at a normal conversational volume, she is not yet a hearing aid candidate. Recommend instead she follow up every other year for a hearing test, or come in sooner if hs feels her hearing has changed.  ? ?Recommendations: ?1.   Recommend monitoring hearing loss with audiologic evaluation every other year. [name] was informed of this plan.  ?  ?Alfonse Alpers  ?Audiologist, Au.D., CCC-A ?09/28/2021  9:24 AM ? ?Cc: Lesleigh Noe, MD  ?

## 2021-10-07 ENCOUNTER — Encounter: Payer: Self-pay | Admitting: Family Medicine

## 2021-10-08 ENCOUNTER — Other Ambulatory Visit: Payer: Self-pay | Admitting: Physician Assistant

## 2021-10-13 DIAGNOSIS — L708 Other acne: Secondary | ICD-10-CM | POA: Diagnosis not present

## 2021-10-19 DIAGNOSIS — M9903 Segmental and somatic dysfunction of lumbar region: Secondary | ICD-10-CM | POA: Diagnosis not present

## 2021-10-19 DIAGNOSIS — M5417 Radiculopathy, lumbosacral region: Secondary | ICD-10-CM | POA: Diagnosis not present

## 2021-10-19 DIAGNOSIS — M9902 Segmental and somatic dysfunction of thoracic region: Secondary | ICD-10-CM | POA: Diagnosis not present

## 2021-10-19 DIAGNOSIS — M9904 Segmental and somatic dysfunction of sacral region: Secondary | ICD-10-CM | POA: Diagnosis not present

## 2021-10-20 DIAGNOSIS — M5417 Radiculopathy, lumbosacral region: Secondary | ICD-10-CM | POA: Diagnosis not present

## 2021-10-20 DIAGNOSIS — M9904 Segmental and somatic dysfunction of sacral region: Secondary | ICD-10-CM | POA: Diagnosis not present

## 2021-10-20 DIAGNOSIS — M9903 Segmental and somatic dysfunction of lumbar region: Secondary | ICD-10-CM | POA: Diagnosis not present

## 2021-10-20 DIAGNOSIS — M9902 Segmental and somatic dysfunction of thoracic region: Secondary | ICD-10-CM | POA: Diagnosis not present

## 2021-10-22 DIAGNOSIS — M5417 Radiculopathy, lumbosacral region: Secondary | ICD-10-CM | POA: Diagnosis not present

## 2021-10-22 DIAGNOSIS — M9902 Segmental and somatic dysfunction of thoracic region: Secondary | ICD-10-CM | POA: Diagnosis not present

## 2021-10-22 DIAGNOSIS — M9904 Segmental and somatic dysfunction of sacral region: Secondary | ICD-10-CM | POA: Diagnosis not present

## 2021-10-22 DIAGNOSIS — M9903 Segmental and somatic dysfunction of lumbar region: Secondary | ICD-10-CM | POA: Diagnosis not present

## 2021-10-26 DIAGNOSIS — M9902 Segmental and somatic dysfunction of thoracic region: Secondary | ICD-10-CM | POA: Diagnosis not present

## 2021-10-26 DIAGNOSIS — M9904 Segmental and somatic dysfunction of sacral region: Secondary | ICD-10-CM | POA: Diagnosis not present

## 2021-10-26 DIAGNOSIS — M9903 Segmental and somatic dysfunction of lumbar region: Secondary | ICD-10-CM | POA: Diagnosis not present

## 2021-10-26 DIAGNOSIS — M5417 Radiculopathy, lumbosacral region: Secondary | ICD-10-CM | POA: Diagnosis not present

## 2021-10-27 DIAGNOSIS — M9902 Segmental and somatic dysfunction of thoracic region: Secondary | ICD-10-CM | POA: Diagnosis not present

## 2021-10-27 DIAGNOSIS — M9903 Segmental and somatic dysfunction of lumbar region: Secondary | ICD-10-CM | POA: Diagnosis not present

## 2021-10-27 DIAGNOSIS — M9904 Segmental and somatic dysfunction of sacral region: Secondary | ICD-10-CM | POA: Diagnosis not present

## 2021-10-27 DIAGNOSIS — M5417 Radiculopathy, lumbosacral region: Secondary | ICD-10-CM | POA: Diagnosis not present

## 2021-10-29 DIAGNOSIS — M9904 Segmental and somatic dysfunction of sacral region: Secondary | ICD-10-CM | POA: Diagnosis not present

## 2021-10-29 DIAGNOSIS — M5417 Radiculopathy, lumbosacral region: Secondary | ICD-10-CM | POA: Diagnosis not present

## 2021-10-29 DIAGNOSIS — M9903 Segmental and somatic dysfunction of lumbar region: Secondary | ICD-10-CM | POA: Diagnosis not present

## 2021-10-29 DIAGNOSIS — M9902 Segmental and somatic dysfunction of thoracic region: Secondary | ICD-10-CM | POA: Diagnosis not present

## 2021-11-03 DIAGNOSIS — M5417 Radiculopathy, lumbosacral region: Secondary | ICD-10-CM | POA: Diagnosis not present

## 2021-11-03 DIAGNOSIS — M9903 Segmental and somatic dysfunction of lumbar region: Secondary | ICD-10-CM | POA: Diagnosis not present

## 2021-11-03 DIAGNOSIS — M9904 Segmental and somatic dysfunction of sacral region: Secondary | ICD-10-CM | POA: Diagnosis not present

## 2021-11-03 DIAGNOSIS — M9902 Segmental and somatic dysfunction of thoracic region: Secondary | ICD-10-CM | POA: Diagnosis not present

## 2021-11-04 DIAGNOSIS — M9902 Segmental and somatic dysfunction of thoracic region: Secondary | ICD-10-CM | POA: Diagnosis not present

## 2021-11-04 DIAGNOSIS — M5417 Radiculopathy, lumbosacral region: Secondary | ICD-10-CM | POA: Diagnosis not present

## 2021-11-04 DIAGNOSIS — M9904 Segmental and somatic dysfunction of sacral region: Secondary | ICD-10-CM | POA: Diagnosis not present

## 2021-11-04 DIAGNOSIS — M9903 Segmental and somatic dysfunction of lumbar region: Secondary | ICD-10-CM | POA: Diagnosis not present

## 2021-11-05 DIAGNOSIS — M5417 Radiculopathy, lumbosacral region: Secondary | ICD-10-CM | POA: Diagnosis not present

## 2021-11-05 DIAGNOSIS — M9902 Segmental and somatic dysfunction of thoracic region: Secondary | ICD-10-CM | POA: Diagnosis not present

## 2021-11-05 DIAGNOSIS — M9904 Segmental and somatic dysfunction of sacral region: Secondary | ICD-10-CM | POA: Diagnosis not present

## 2021-11-05 DIAGNOSIS — M9903 Segmental and somatic dysfunction of lumbar region: Secondary | ICD-10-CM | POA: Diagnosis not present

## 2021-11-09 DIAGNOSIS — M9902 Segmental and somatic dysfunction of thoracic region: Secondary | ICD-10-CM | POA: Diagnosis not present

## 2021-11-09 DIAGNOSIS — M9903 Segmental and somatic dysfunction of lumbar region: Secondary | ICD-10-CM | POA: Diagnosis not present

## 2021-11-09 DIAGNOSIS — M9904 Segmental and somatic dysfunction of sacral region: Secondary | ICD-10-CM | POA: Diagnosis not present

## 2021-11-09 DIAGNOSIS — M5417 Radiculopathy, lumbosacral region: Secondary | ICD-10-CM | POA: Diagnosis not present

## 2021-11-11 DIAGNOSIS — M25561 Pain in right knee: Secondary | ICD-10-CM | POA: Diagnosis not present

## 2021-11-11 DIAGNOSIS — R413 Other amnesia: Secondary | ICD-10-CM | POA: Diagnosis not present

## 2021-11-11 DIAGNOSIS — M25562 Pain in left knee: Secondary | ICD-10-CM | POA: Diagnosis not present

## 2021-11-11 DIAGNOSIS — R296 Repeated falls: Secondary | ICD-10-CM | POA: Diagnosis not present

## 2021-11-12 DIAGNOSIS — M9904 Segmental and somatic dysfunction of sacral region: Secondary | ICD-10-CM | POA: Diagnosis not present

## 2021-11-12 DIAGNOSIS — M5417 Radiculopathy, lumbosacral region: Secondary | ICD-10-CM | POA: Diagnosis not present

## 2021-11-12 DIAGNOSIS — M9902 Segmental and somatic dysfunction of thoracic region: Secondary | ICD-10-CM | POA: Diagnosis not present

## 2021-11-12 DIAGNOSIS — M9903 Segmental and somatic dysfunction of lumbar region: Secondary | ICD-10-CM | POA: Diagnosis not present

## 2021-11-17 DIAGNOSIS — M5417 Radiculopathy, lumbosacral region: Secondary | ICD-10-CM | POA: Diagnosis not present

## 2021-11-17 DIAGNOSIS — M9903 Segmental and somatic dysfunction of lumbar region: Secondary | ICD-10-CM | POA: Diagnosis not present

## 2021-11-17 DIAGNOSIS — M9904 Segmental and somatic dysfunction of sacral region: Secondary | ICD-10-CM | POA: Diagnosis not present

## 2021-11-17 DIAGNOSIS — M9902 Segmental and somatic dysfunction of thoracic region: Secondary | ICD-10-CM | POA: Diagnosis not present

## 2021-11-19 DIAGNOSIS — M9902 Segmental and somatic dysfunction of thoracic region: Secondary | ICD-10-CM | POA: Diagnosis not present

## 2021-11-19 DIAGNOSIS — M5417 Radiculopathy, lumbosacral region: Secondary | ICD-10-CM | POA: Diagnosis not present

## 2021-11-19 DIAGNOSIS — M9903 Segmental and somatic dysfunction of lumbar region: Secondary | ICD-10-CM | POA: Diagnosis not present

## 2021-11-19 DIAGNOSIS — M9904 Segmental and somatic dysfunction of sacral region: Secondary | ICD-10-CM | POA: Diagnosis not present

## 2021-11-24 DIAGNOSIS — M9903 Segmental and somatic dysfunction of lumbar region: Secondary | ICD-10-CM | POA: Diagnosis not present

## 2021-11-24 DIAGNOSIS — M1712 Unilateral primary osteoarthritis, left knee: Secondary | ICD-10-CM | POA: Diagnosis not present

## 2021-11-24 DIAGNOSIS — M9904 Segmental and somatic dysfunction of sacral region: Secondary | ICD-10-CM | POA: Diagnosis not present

## 2021-11-24 DIAGNOSIS — M1711 Unilateral primary osteoarthritis, right knee: Secondary | ICD-10-CM | POA: Diagnosis not present

## 2021-11-24 DIAGNOSIS — M5417 Radiculopathy, lumbosacral region: Secondary | ICD-10-CM | POA: Diagnosis not present

## 2021-11-24 DIAGNOSIS — M170 Bilateral primary osteoarthritis of knee: Secondary | ICD-10-CM | POA: Diagnosis not present

## 2021-11-24 DIAGNOSIS — M9902 Segmental and somatic dysfunction of thoracic region: Secondary | ICD-10-CM | POA: Diagnosis not present

## 2021-11-26 DIAGNOSIS — M9903 Segmental and somatic dysfunction of lumbar region: Secondary | ICD-10-CM | POA: Diagnosis not present

## 2021-11-26 DIAGNOSIS — M9902 Segmental and somatic dysfunction of thoracic region: Secondary | ICD-10-CM | POA: Diagnosis not present

## 2021-11-26 DIAGNOSIS — M9904 Segmental and somatic dysfunction of sacral region: Secondary | ICD-10-CM | POA: Diagnosis not present

## 2021-11-26 DIAGNOSIS — M5417 Radiculopathy, lumbosacral region: Secondary | ICD-10-CM | POA: Diagnosis not present

## 2021-12-01 DIAGNOSIS — M5417 Radiculopathy, lumbosacral region: Secondary | ICD-10-CM | POA: Diagnosis not present

## 2021-12-01 DIAGNOSIS — M9904 Segmental and somatic dysfunction of sacral region: Secondary | ICD-10-CM | POA: Diagnosis not present

## 2021-12-01 DIAGNOSIS — M9903 Segmental and somatic dysfunction of lumbar region: Secondary | ICD-10-CM | POA: Diagnosis not present

## 2021-12-01 DIAGNOSIS — M9902 Segmental and somatic dysfunction of thoracic region: Secondary | ICD-10-CM | POA: Diagnosis not present

## 2021-12-03 DIAGNOSIS — M9904 Segmental and somatic dysfunction of sacral region: Secondary | ICD-10-CM | POA: Diagnosis not present

## 2021-12-03 DIAGNOSIS — M5417 Radiculopathy, lumbosacral region: Secondary | ICD-10-CM | POA: Diagnosis not present

## 2021-12-03 DIAGNOSIS — M9903 Segmental and somatic dysfunction of lumbar region: Secondary | ICD-10-CM | POA: Diagnosis not present

## 2021-12-03 DIAGNOSIS — M9902 Segmental and somatic dysfunction of thoracic region: Secondary | ICD-10-CM | POA: Diagnosis not present

## 2021-12-08 DIAGNOSIS — M9904 Segmental and somatic dysfunction of sacral region: Secondary | ICD-10-CM | POA: Diagnosis not present

## 2021-12-08 DIAGNOSIS — M9903 Segmental and somatic dysfunction of lumbar region: Secondary | ICD-10-CM | POA: Diagnosis not present

## 2021-12-08 DIAGNOSIS — M5417 Radiculopathy, lumbosacral region: Secondary | ICD-10-CM | POA: Diagnosis not present

## 2021-12-08 DIAGNOSIS — M9902 Segmental and somatic dysfunction of thoracic region: Secondary | ICD-10-CM | POA: Diagnosis not present

## 2021-12-10 DIAGNOSIS — M9903 Segmental and somatic dysfunction of lumbar region: Secondary | ICD-10-CM | POA: Diagnosis not present

## 2021-12-10 DIAGNOSIS — M5417 Radiculopathy, lumbosacral region: Secondary | ICD-10-CM | POA: Diagnosis not present

## 2021-12-10 DIAGNOSIS — M9904 Segmental and somatic dysfunction of sacral region: Secondary | ICD-10-CM | POA: Diagnosis not present

## 2021-12-10 DIAGNOSIS — M9902 Segmental and somatic dysfunction of thoracic region: Secondary | ICD-10-CM | POA: Diagnosis not present

## 2021-12-15 DIAGNOSIS — M5417 Radiculopathy, lumbosacral region: Secondary | ICD-10-CM | POA: Diagnosis not present

## 2021-12-15 DIAGNOSIS — M9902 Segmental and somatic dysfunction of thoracic region: Secondary | ICD-10-CM | POA: Diagnosis not present

## 2021-12-15 DIAGNOSIS — M9903 Segmental and somatic dysfunction of lumbar region: Secondary | ICD-10-CM | POA: Diagnosis not present

## 2021-12-15 DIAGNOSIS — M9904 Segmental and somatic dysfunction of sacral region: Secondary | ICD-10-CM | POA: Diagnosis not present

## 2021-12-17 DIAGNOSIS — M9904 Segmental and somatic dysfunction of sacral region: Secondary | ICD-10-CM | POA: Diagnosis not present

## 2021-12-17 DIAGNOSIS — M5417 Radiculopathy, lumbosacral region: Secondary | ICD-10-CM | POA: Diagnosis not present

## 2021-12-17 DIAGNOSIS — M9902 Segmental and somatic dysfunction of thoracic region: Secondary | ICD-10-CM | POA: Diagnosis not present

## 2021-12-17 DIAGNOSIS — M9903 Segmental and somatic dysfunction of lumbar region: Secondary | ICD-10-CM | POA: Diagnosis not present

## 2021-12-22 DIAGNOSIS — M5417 Radiculopathy, lumbosacral region: Secondary | ICD-10-CM | POA: Diagnosis not present

## 2021-12-22 DIAGNOSIS — M9904 Segmental and somatic dysfunction of sacral region: Secondary | ICD-10-CM | POA: Diagnosis not present

## 2021-12-22 DIAGNOSIS — M9902 Segmental and somatic dysfunction of thoracic region: Secondary | ICD-10-CM | POA: Diagnosis not present

## 2021-12-22 DIAGNOSIS — M9903 Segmental and somatic dysfunction of lumbar region: Secondary | ICD-10-CM | POA: Diagnosis not present

## 2021-12-29 DIAGNOSIS — M5417 Radiculopathy, lumbosacral region: Secondary | ICD-10-CM | POA: Diagnosis not present

## 2021-12-29 DIAGNOSIS — M9902 Segmental and somatic dysfunction of thoracic region: Secondary | ICD-10-CM | POA: Diagnosis not present

## 2021-12-29 DIAGNOSIS — M9903 Segmental and somatic dysfunction of lumbar region: Secondary | ICD-10-CM | POA: Diagnosis not present

## 2021-12-29 DIAGNOSIS — M9904 Segmental and somatic dysfunction of sacral region: Secondary | ICD-10-CM | POA: Diagnosis not present

## 2022-01-05 DIAGNOSIS — M5417 Radiculopathy, lumbosacral region: Secondary | ICD-10-CM | POA: Diagnosis not present

## 2022-01-05 DIAGNOSIS — M9903 Segmental and somatic dysfunction of lumbar region: Secondary | ICD-10-CM | POA: Diagnosis not present

## 2022-01-05 DIAGNOSIS — M9904 Segmental and somatic dysfunction of sacral region: Secondary | ICD-10-CM | POA: Diagnosis not present

## 2022-01-05 DIAGNOSIS — M9902 Segmental and somatic dysfunction of thoracic region: Secondary | ICD-10-CM | POA: Diagnosis not present

## 2022-01-07 DIAGNOSIS — M9902 Segmental and somatic dysfunction of thoracic region: Secondary | ICD-10-CM | POA: Diagnosis not present

## 2022-01-07 DIAGNOSIS — M5417 Radiculopathy, lumbosacral region: Secondary | ICD-10-CM | POA: Diagnosis not present

## 2022-01-07 DIAGNOSIS — M9904 Segmental and somatic dysfunction of sacral region: Secondary | ICD-10-CM | POA: Diagnosis not present

## 2022-01-07 DIAGNOSIS — M9903 Segmental and somatic dysfunction of lumbar region: Secondary | ICD-10-CM | POA: Diagnosis not present

## 2022-01-12 DIAGNOSIS — M9902 Segmental and somatic dysfunction of thoracic region: Secondary | ICD-10-CM | POA: Diagnosis not present

## 2022-01-12 DIAGNOSIS — M5417 Radiculopathy, lumbosacral region: Secondary | ICD-10-CM | POA: Diagnosis not present

## 2022-01-12 DIAGNOSIS — M9904 Segmental and somatic dysfunction of sacral region: Secondary | ICD-10-CM | POA: Diagnosis not present

## 2022-01-12 DIAGNOSIS — M9903 Segmental and somatic dysfunction of lumbar region: Secondary | ICD-10-CM | POA: Diagnosis not present

## 2022-01-19 DIAGNOSIS — M5417 Radiculopathy, lumbosacral region: Secondary | ICD-10-CM | POA: Diagnosis not present

## 2022-01-19 DIAGNOSIS — M9904 Segmental and somatic dysfunction of sacral region: Secondary | ICD-10-CM | POA: Diagnosis not present

## 2022-01-19 DIAGNOSIS — M9903 Segmental and somatic dysfunction of lumbar region: Secondary | ICD-10-CM | POA: Diagnosis not present

## 2022-01-19 DIAGNOSIS — M9902 Segmental and somatic dysfunction of thoracic region: Secondary | ICD-10-CM | POA: Diagnosis not present

## 2022-01-28 DIAGNOSIS — M5417 Radiculopathy, lumbosacral region: Secondary | ICD-10-CM | POA: Diagnosis not present

## 2022-01-28 DIAGNOSIS — M9904 Segmental and somatic dysfunction of sacral region: Secondary | ICD-10-CM | POA: Diagnosis not present

## 2022-01-28 DIAGNOSIS — M9903 Segmental and somatic dysfunction of lumbar region: Secondary | ICD-10-CM | POA: Diagnosis not present

## 2022-01-28 DIAGNOSIS — M9902 Segmental and somatic dysfunction of thoracic region: Secondary | ICD-10-CM | POA: Diagnosis not present

## 2022-01-29 DIAGNOSIS — M25562 Pain in left knee: Secondary | ICD-10-CM | POA: Diagnosis not present

## 2022-01-29 DIAGNOSIS — R413 Other amnesia: Secondary | ICD-10-CM | POA: Diagnosis not present

## 2022-01-29 DIAGNOSIS — M25561 Pain in right knee: Secondary | ICD-10-CM | POA: Diagnosis not present

## 2022-02-02 DIAGNOSIS — M9904 Segmental and somatic dysfunction of sacral region: Secondary | ICD-10-CM | POA: Diagnosis not present

## 2022-02-02 DIAGNOSIS — M9902 Segmental and somatic dysfunction of thoracic region: Secondary | ICD-10-CM | POA: Diagnosis not present

## 2022-02-02 DIAGNOSIS — M5417 Radiculopathy, lumbosacral region: Secondary | ICD-10-CM | POA: Diagnosis not present

## 2022-02-02 DIAGNOSIS — M9903 Segmental and somatic dysfunction of lumbar region: Secondary | ICD-10-CM | POA: Diagnosis not present

## 2022-02-04 DIAGNOSIS — M9904 Segmental and somatic dysfunction of sacral region: Secondary | ICD-10-CM | POA: Diagnosis not present

## 2022-02-04 DIAGNOSIS — M5417 Radiculopathy, lumbosacral region: Secondary | ICD-10-CM | POA: Diagnosis not present

## 2022-02-04 DIAGNOSIS — M9903 Segmental and somatic dysfunction of lumbar region: Secondary | ICD-10-CM | POA: Diagnosis not present

## 2022-02-04 DIAGNOSIS — M9902 Segmental and somatic dysfunction of thoracic region: Secondary | ICD-10-CM | POA: Diagnosis not present

## 2022-02-08 DIAGNOSIS — M9903 Segmental and somatic dysfunction of lumbar region: Secondary | ICD-10-CM | POA: Diagnosis not present

## 2022-02-08 DIAGNOSIS — M9902 Segmental and somatic dysfunction of thoracic region: Secondary | ICD-10-CM | POA: Diagnosis not present

## 2022-02-08 DIAGNOSIS — M5417 Radiculopathy, lumbosacral region: Secondary | ICD-10-CM | POA: Diagnosis not present

## 2022-02-08 DIAGNOSIS — M9904 Segmental and somatic dysfunction of sacral region: Secondary | ICD-10-CM | POA: Diagnosis not present

## 2022-02-09 ENCOUNTER — Ambulatory Visit (INDEPENDENT_AMBULATORY_CARE_PROVIDER_SITE_OTHER): Payer: Medicare Other | Admitting: Family Medicine

## 2022-02-09 VITALS — BP 140/82 | HR 59 | Temp 97.2°F | Ht 66.5 in | Wt 173.1 lb

## 2022-02-09 DIAGNOSIS — I10 Essential (primary) hypertension: Secondary | ICD-10-CM | POA: Diagnosis not present

## 2022-02-09 DIAGNOSIS — I4892 Unspecified atrial flutter: Secondary | ICD-10-CM | POA: Diagnosis not present

## 2022-02-09 DIAGNOSIS — L819 Disorder of pigmentation, unspecified: Secondary | ICD-10-CM

## 2022-02-09 LAB — COMPREHENSIVE METABOLIC PANEL WITH GFR
ALT: 16 U/L (ref 0–35)
AST: 20 U/L (ref 0–37)
Albumin: 4.4 g/dL (ref 3.5–5.2)
Alkaline Phosphatase: 78 U/L (ref 39–117)
BUN: 12 mg/dL (ref 6–23)
CO2: 31 meq/L (ref 19–32)
Calcium: 9.6 mg/dL (ref 8.4–10.5)
Chloride: 103 meq/L (ref 96–112)
Creatinine, Ser: 0.78 mg/dL (ref 0.40–1.20)
GFR: 70.19 mL/min
Glucose, Bld: 79 mg/dL (ref 70–99)
Potassium: 4.2 meq/L (ref 3.5–5.1)
Sodium: 141 meq/L (ref 135–145)
Total Bilirubin: 0.6 mg/dL (ref 0.2–1.2)
Total Protein: 6.8 g/dL (ref 6.0–8.3)

## 2022-02-09 LAB — LIPID PANEL
Cholesterol: 250 mg/dL — ABNORMAL HIGH (ref 0–200)
HDL: 137.9 mg/dL
LDL Cholesterol: 101 mg/dL — ABNORMAL HIGH (ref 0–99)
NonHDL: 112.25
Total CHOL/HDL Ratio: 2
Triglycerides: 58 mg/dL (ref 0.0–149.0)
VLDL: 11.6 mg/dL (ref 0.0–40.0)

## 2022-02-09 LAB — CBC WITH DIFFERENTIAL/PLATELET
Basophils Absolute: 0 10*3/uL (ref 0.0–0.1)
Basophils Relative: 0.6 % (ref 0.0–3.0)
Eosinophils Absolute: 0.2 10*3/uL (ref 0.0–0.7)
Eosinophils Relative: 3 % (ref 0.0–5.0)
HCT: 39.5 % (ref 36.0–46.0)
Hemoglobin: 13.2 g/dL (ref 12.0–15.0)
Lymphocytes Relative: 30 % (ref 12.0–46.0)
Lymphs Abs: 1.5 10*3/uL (ref 0.7–4.0)
MCHC: 33.4 g/dL (ref 30.0–36.0)
MCV: 88.9 fl (ref 78.0–100.0)
Monocytes Absolute: 0.5 10*3/uL (ref 0.1–1.0)
Monocytes Relative: 9 % (ref 3.0–12.0)
Neutro Abs: 2.9 10*3/uL (ref 1.4–7.7)
Neutrophils Relative %: 57.4 % (ref 43.0–77.0)
Platelets: 293 10*3/uL (ref 150.0–400.0)
RBC: 4.45 Mil/uL (ref 3.87–5.11)
RDW: 13.7 % (ref 11.5–15.5)
WBC: 5.1 10*3/uL (ref 4.0–10.5)

## 2022-02-09 NOTE — Progress Notes (Signed)
Subjective:    Mandy Castillo is a 81 y.o. female who presents to the office today for a preoperative consultation at the request of surgeon Dr. Luiz Blare who plans on performing Bilateral knee replacement anticipated in August or September   This consultation is requested for the specific conditions prompting preoperative evaluation (i.e. because of potential affect on operative risk): atrial flutter - on metoprolol. Planned anesthesia is  pt is unsure . The patient has the following known anesthesia issues:  no personal or family history of issues .    Patient has a bleeding risk of: no recent abnormal bleeding and no remote history of abnormal bleeding. Patient does not have objections to receiving blood products if needed.  Respiratory Risk Factors:  Denies: cough, sputum, hemoptysis, pleurisy/chest pain, asthma, or dyspnea on exertion Social History   Tobacco Use   Smoking status: Never   Smokeless tobacco: Never  Substance Use Topics   Alcohol use: Yes    Comment: wine every few months    If symptoms present: Consider pulmonary function testing or peak flow, CXR, ECG (>40 yo), hemoglobin, glucose (>45 yo)  Cardiac Risk Factors Age >40, other risks If presents - should obtain ECG. Further tests indicated if ECG with abnormalities   METs - Is able to complete exercise of 4 or more METs Yes > 4 METs: climbing 1 flight of stairs, mowing the lawn (walking), gardening, golfing w/o a cart, doubles tennis, swimming, riding a bike, square dancing, jogging   Patient is having a Moderate Risk risk surgery.  High Risk surgery: emergency surgery, anticipated increased blood loss, aortic or peripheral vascular surgery Intermediate Risk: Abdominal/thoracic, head and neck, carotid endarterectomy, orthopedic surgery, prostate surgery Low Risk: Breast surgery, cataract surgery, superficial surgery, endoscopy   The following portions of the patient's history were reviewed and updated as  appropriate: allergies, current medications, past family history, past medical history, past social history, past surgical history, and problem list.  Review of Systems Review of Systems  Constitutional:  Negative for chills and fever.  HENT:  Negative for congestion and sore throat.   Eyes:  Negative for blurred vision and double vision.  Respiratory:  Negative for shortness of breath.   Cardiovascular:  Negative for chest pain.  Gastrointestinal:  Negative for heartburn, nausea and vomiting.  Genitourinary: Negative.   Musculoskeletal: Negative.  Negative for myalgias.  Skin:  Negative for rash.  Neurological:  Negative for dizziness and headaches.  Endo/Heme/Allergies:  Does not bruise/bleed easily.  Psychiatric/Behavioral:  Negative for depression. The patient is not nervous/anxious.        Objective:      Physical Exam BP Readings from Last 3 Encounters:  02/09/22 140/82  09/15/21 120/70  08/19/21 130/70   Wt Readings from Last 3 Encounters:  02/09/22 173 lb 2 oz (78.5 kg)  09/15/21 176 lb 9 oz (80.1 kg)  08/19/21 178 lb 6 oz (80.9 kg)    Physical Exam Constitutional:      General: She is not in acute distress.    Appearance: She is well-developed. She is not diaphoretic.  HENT:     Right Ear: External ear normal.     Left Ear: External ear normal.     Nose: Nose normal.     Mouth/Throat:     Mouth: Mucous membranes are moist.  Eyes:     Conjunctiva/sclera: Conjunctivae normal.  Cardiovascular:     Rate and Rhythm: Normal rate and regular rhythm.     Heart sounds:  No murmur heard. Pulmonary:     Effort: Pulmonary effort is normal. No respiratory distress.     Breath sounds: Normal breath sounds. No wheezing.  Abdominal:     General: Abdomen is flat. There is no distension.     Palpations: Abdomen is soft.     Tenderness: There is no abdominal tenderness. There is no guarding or rebound.  Musculoskeletal:     Cervical back: Neck supple.  Skin:     General: Skin is warm and dry.     Capillary Refill: Capillary refill takes less than 2 seconds.  Neurological:     Mental Status: She is alert. Mental status is at baseline.  Psychiatric:        Mood and Affect: Mood normal.        Behavior: Behavior normal.      Predictors of intubation difficulty:  Morbid obesity? no  Anatomically abnormal facies? no  Prominent incisors? no  Receding mandible? no  Short, thick neck? no  Neck range of motion: chronic neck pain   Cardiographics Previous EKG with NSR        Assessment:    81 y.o. female with planned surgery as above.   Known risk factors for perioperative complications:  HTN and atrial flutter    Difficulty with intubation is not anticipated.  Cardiac Risk Estimation: per the Revised Cardiac Risk Index (Circ. 100:1043, 1999), the patient's risk factors for cardiac complications include  none , putting her in: RCI RISK CLASS I (0 risk factors, risk of major cardiac compl. appr. 0.5%)  Major Clinical Predictors: Present ? No, if Yes ->obtain cardiology consult MI <6 weeks ago, unstable angina, decompensated CHF, significant arrhythmia causing hemodynamic instability, severe valvular disease  Intermediate Clinical Predictors: Present ? No, if Yes -> cardiology if METS <4 or high risk procedure, OK for surgery if METs>4 AND low or intermediate risk Mild angina pectoris, MI >6 weeks ago, compensated CHF, DM Minor clinical Predictors: Present ? No, if Yes -> cardiology if METS <4 AND high risk procedure, OK for surgery if METs>4 OR low or intermediate risk Advanced age, abnormal ECG, cardiac rhythm other than sinus, local functional capacity, hx of stroke, uncontrolled HTN  Current medications which may produce withdrawal symptoms if withheld perioperatively: none.      Plan:   Problem List Items Addressed This Visit       Cardiovascular and Mediastinum   Atrial flutter (HCC)    Controlled. Cont metoprolol 50 mg daily       Essential hypertension - Primary    Slightly elevated, improved on repeat. Return in 4 weeks for recheck prior to surgery. Cont metoprolol 50 mg      Relevant Orders   Comprehensive metabolic panel   CBC with Differential   Lipid panel     Musculoskeletal and Integument   Atypical pigmented skin lesion    On right forehead. Notes she did not like the other dermatologist she saw, would like second opinion.       Relevant Orders   Ambulatory referral to Dermatology       1. Preoperative workup as follows blood work. 2. Change in medication regimen before surgery: discontinue ASA 14 days before surgery. 3. Prophylaxis for cardiac events with perioperative beta-blockers: Recommend patient continue her daily metoprolol on day of surgery. 4. Invasive hemodynamic monitoring perioperatively: at the discretion of anesthesiologist. 5. Deep vein thrombosis prophylaxis postoperatively:regimen to be chosen by surgical team. 6. Surveillance for postoperative MI with ECG immediately  postoperatively and on postoperative days 1 and 2 AND troponin levels 24 hours postoperatively and on day 4 or hospital discharge (whichever comes first): at the discretion of anesthesiologist.      Lynnda Child, MD

## 2022-02-09 NOTE — Assessment & Plan Note (Signed)
Controlled. Cont metoprolol 50 mg daily

## 2022-02-09 NOTE — Assessment & Plan Note (Signed)
Slightly elevated, improved on repeat. Return in 4 weeks for recheck prior to surgery. Cont metoprolol 50 mg

## 2022-02-09 NOTE — Patient Instructions (Signed)
#  Referral I have placed a referral to a specialist for you. You should receive a phone call from the specialty office. Make sure your voicemail is not full and that if you are able to answer your phone to unknown or new numbers.   It may take up to 2 weeks to hear about the referral. If you do not hear anything in 2 weeks, please call our office and ask to speak with the referral coordinator.  [facility] dermatology

## 2022-02-09 NOTE — Assessment & Plan Note (Signed)
On right forehead. Notes she did not like the other dermatologist she saw, would like second opinion.

## 2022-02-11 ENCOUNTER — Other Ambulatory Visit: Payer: Self-pay | Admitting: Family Medicine

## 2022-02-11 ENCOUNTER — Other Ambulatory Visit: Payer: Self-pay | Admitting: Cardiology

## 2022-02-11 DIAGNOSIS — R1013 Epigastric pain: Secondary | ICD-10-CM

## 2022-02-17 ENCOUNTER — Other Ambulatory Visit: Payer: Self-pay | Admitting: Cardiology

## 2022-02-18 MED ORDER — METOPROLOL SUCCINATE ER 50 MG PO TB24: 50.0000 mg | ORAL_TABLET | Freq: Every day | ORAL | 1 refills | Status: DC

## 2022-02-18 NOTE — Telephone Encounter (Signed)
Spoke to patient she stated she needs Dr.Kelly to clear her for upcoming knee replacement.Appointment scheduled with Dr.Kelly 03/17/22 at 2:00 pm.

## 2022-03-12 DIAGNOSIS — L708 Other acne: Secondary | ICD-10-CM | POA: Diagnosis not present

## 2022-03-17 ENCOUNTER — Ambulatory Visit: Payer: Medicare Other | Admitting: Cardiovascular Disease

## 2022-03-18 ENCOUNTER — Encounter: Payer: Self-pay | Admitting: Nurse Practitioner

## 2022-03-18 ENCOUNTER — Ambulatory Visit (INDEPENDENT_AMBULATORY_CARE_PROVIDER_SITE_OTHER): Payer: Medicare Other | Admitting: Nurse Practitioner

## 2022-03-18 VITALS — BP 130/80 | HR 58 | Ht 68.0 in | Wt 174.4 lb

## 2022-03-18 DIAGNOSIS — I10 Essential (primary) hypertension: Secondary | ICD-10-CM

## 2022-03-18 DIAGNOSIS — Z01810 Encounter for preprocedural cardiovascular examination: Secondary | ICD-10-CM | POA: Diagnosis not present

## 2022-03-18 DIAGNOSIS — I471 Supraventricular tachycardia: Secondary | ICD-10-CM | POA: Diagnosis not present

## 2022-03-18 NOTE — Patient Instructions (Signed)
Medication Instructions:  Your physician recommends that you continue on your current medications as directed. Please refer to the Current Medication list given to you today.   *If you need a refill on your cardiac medications before your next appointment, please call your pharmacy*   Lab Work: NONE ordered at this time of appointment   If you have labs (blood work) drawn today and your tests are completely normal, you will receive your results only by: MyChart Message (if you have MyChart) OR A paper copy in the mail If you have any lab test that is abnormal or we need to change your treatment, we will call you to review the results.   Testing/Procedures: NONE ordered at this time of appointment     Follow-Up: At [facility], you and your health needs are our priority.  As part of our continuing mission to provide you with exceptional heart care, we have created designated Provider Care Teams.  These Care Teams include your primary Cardiologist (physician) and Advanced Practice Providers (APPs -  Physician Assistants and Nurse Practitioners) who all work together to provide you with the care you need, when you need it.  We recommend signing up for the patient portal called "MyChart".  Sign up information is provided on this After Visit Summary.  MyChart is used to connect with patients for Virtual Visits (Telemedicine).  Patients are able to view lab/test results, encounter notes, upcoming appointments, etc.  Non-urgent messages can be sent to your provider as well.   To learn more about what you can do with MyChart, go to [email or web address].    Your next appointment:   1 year(s)  The format for your next appointment:   In Person  Provider:   Thomas Kelly, MD     Other Instructions   Important Information About Sugar       

## 2022-03-18 NOTE — Progress Notes (Signed)
Office Visit    Patient Name: Mandy Castillo Date of Encounter: 03/18/2022  Primary Care Provider:  Lynnda Child, MD Primary Cardiologist:  Nicki Guadalajara, MD  Chief Complaint    81 year old female with a history of SVT and hypertension who presents for follow-up related to SVT and for preoperative cardiac evaluation for upcoming knee replacement with Dr. Jodi Geralds of [facility].  Past Medical History    Past Medical History:  Diagnosis Date   Sebaceous cyst 07/24/2020   Past Surgical History:  Procedure Laterality Date   ABDOMINAL HYSTERECTOMY     APPENDECTOMY     BREAST SURGERY     cyst removed   CHOLECYSTECTOMY      Allergies  No Known Allergies  History of Present Illness    81 year old female with the above past medical history including SVT and hypertension.  Prior EKG obtained by PCP showed possible atrial flutter with 3-1 AV block.  She was started on Eliquis 5 mg twice daily as well as metoprolol. She was referred to Dr. Tresa Endo in August 2020 for further evaluation.  Upon review of her EKG it was felt that P wave was present however exceedingly diminutive making interpretation difficult.  EKG showed sinus rhythm.  Echocardiogram in September 2020 showed EF 60 to 65%, G1 DD, normal left and right atrium size, mild MR, trivial pericardial effusion.  2 weeks Zio monitor showed predominantly sinus rhythm, average heart rate 67 bpm, 1 short episode of SVT, no evidence of atrial fibrillation or atrial flutter.  Metoprolol was increased and Eliquis was discontinued.  She was started on aspirin 81 mg daily.  She was last seen virtually on 09/21/2019 and was stable from a cardiac standpoint.  She presents today for follow-up and for preoperative cardiac evaluation for upcoming knee replacement. Since her last visit she has done well from a cardiac standpoint.  She denies any palpitations, dyspnea, denies symptoms concerning for angina.  BP is well controlled.  She is active.   She has started taking Aricept for some mild memory impairment.  Overall, she reports feeling well and denies any new concerns today.  Home Medications    Current Outpatient Medications  Medication Sig Dispense Refill   aspirin EC 81 MG tablet Take 1 tablet (81 mg total) by mouth daily. 90 tablet 3   Cholecalciferol (D3 PO) Take by mouth daily.     Cyanocobalamin (B-12 PO) Take by mouth daily.     donepezil (ARICEPT) 10 MG tablet Take 10 mg by mouth at bedtime.     metoprolol succinate (TOPROL-XL) 50 MG 24 hr tablet Take 1 tablet (50 mg total) by mouth daily. 90 tablet 1   omeprazole (PRILOSEC) 20 MG capsule TAKE 1 CAPSULE BY MOUTH EVERY DAY 90 capsule 1   No current facility-administered medications for this visit.     Review of Systems   She denies chest pain, palpitations, dyspnea, pnd, orthopnea, n, v, dizziness, syncope, edema, weight gain, or early satiety. All other systems reviewed and are otherwise negative except as noted above.   Physical Exam    VS:  BP 130/80   Pulse (!) 58   Ht 5\' 8"  (1.727 m)   Wt 174 lb 6.4 oz (79.1 kg)   SpO2 98%   BMI 26.52 kg/m  GEN: Well nourished, well developed, in no acute distress. HEENT: normal. Neck: Supple, no JVD, carotid bruits, or masses. Cardiac: RRR, no murmurs, rubs, or gallops. No clubbing, cyanosis, edema.  Radials/DP/PT 2+ and equal  bilaterally.  Respiratory:  Respirations regular and unlabored, clear to auscultation bilaterally. GI: Soft, nontender, nondistended, BS + x 4. MS: no deformity or atrophy. Skin: warm and dry, no rash. Neuro:  Strength and sensation are intact. Psych: Normal affect.  Accessory Clinical Findings    ECG personally reviewed by me today -sinus bradycardia, 58 bpm- no acute changes.   Lab Results  Component Value Date   WBC 5.1 02/09/2022   HGB 13.2 02/09/2022   HCT 39.5 02/09/2022   MCV 88.9 02/09/2022   PLT 293.0 02/09/2022   Lab Results  Component Value Date   CREATININE 0.78  02/09/2022   BUN 12 02/09/2022   NA 141 02/09/2022   K 4.2 02/09/2022   CL 103 02/09/2022   CO2 31 02/09/2022   Lab Results  Component Value Date   ALT 16 02/09/2022   AST 20 02/09/2022   ALKPHOS 78 02/09/2022   BILITOT 0.6 02/09/2022   Lab Results  Component Value Date   CHOL 250 (H) 02/09/2022   HDL 137.90 02/09/2022   LDLCALC 101 (H) 02/09/2022   TRIG 58.0 02/09/2022   CHOLHDL 2 02/09/2022    No results found for: "HGBA1C"  Assessment & Plan    1. SVT: Monitor in 03/2019 showed predominantly sinus rhythm, average heart rate 67 bpm, 1 short episode of SVT, no evidence of atrial fibrillation or atrial flutter. Denies any recent palpations.  EKG today shows sinus bradycardia, 58 bpm.  Continue metoprolol.  2. Hypertension: BP well controlled. Continue current antihypertensive regimen.    3. Preoperative cardiac exam: According to the Revised Cardiac Risk Index (RCRI), her Perioperative Risk of Major Cardiac Event is (%): 0.4. Her Functional Capacity in METs is: 7.99 according to the Duke Activity Status Index (DASI). Therefore, based on ACC/AHA guidelines, patient would be at acceptable risk for the planned procedure without further cardiovascular testing. I will route this recommendation to the requesting party via Epic fax function.  4. Disposition: Follow-up in 1 year.       Joylene Grapes, NP 03/18/2022, 2:50 PM

## 2022-04-01 DIAGNOSIS — M170 Bilateral primary osteoarthritis of knee: Secondary | ICD-10-CM | POA: Diagnosis not present

## 2022-04-07 ENCOUNTER — Telehealth: Payer: Self-pay | Admitting: Family Medicine

## 2022-04-07 NOTE — Telephone Encounter (Signed)
Paperwork completed and placed in box.  

## 2022-04-07 NOTE — Telephone Encounter (Signed)
[facility] called in to know status of surgical clearance form  need it returned to schedule pt . Please Advise #[phone]

## 2022-04-07 NOTE — Telephone Encounter (Signed)
Surgical clearance form faxed to [facility]

## 2022-04-15 ENCOUNTER — Other Ambulatory Visit: Payer: Self-pay | Admitting: Orthopedic Surgery

## 2022-04-16 NOTE — Progress Notes (Signed)
Sent message, via epic in basket, requesting orders in epic from surgeon.  

## 2022-04-16 NOTE — Progress Notes (Signed)
COVID Vaccine Completed: Yes  Date of COVID positive in last 90 days:  PCP - Waunita Schooner, MD Cardiologist - Shelva Majestic, MD Neurologist - Gurney Maxin, MD  Cardiac clearance in Epic dated 03-18-22 by Diona Browner, NP  Chest x-ray -  EKG - 03-18-22 Epic Stress Test -  ECHO - greater than 2 years Epic Cardiac Cath -  Pacemaker/ICD device last checked: Spinal Cord Stimulator: Long Term Monitor - 2020 Epic  Bowel Prep -   Sleep Study -  CPAP -   Fasting Blood Sugar -  Checks Blood Sugar _____ times a day  Blood Thinner Instructions: Aspirin Instructions:  ASA 81 Last Dose:  Activity level:  Can go up a flight of stairs and perform activities of daily living without stopping and without symptoms of chest pain or shortness of breath.  Able to exercise without symptoms  Unable to go up a flight of stairs without symptoms of     Anesthesia review: ?Aflutter, HTN, SVT  Patient denies shortness of breath, fever, cough and chest pain at PAT appointment  Patient verbalized understanding of instructions that were given to them at the PAT appointment. Patient was also instructed that they will need to review over the PAT instructions again at home before surgery.

## 2022-04-16 NOTE — Patient Instructions (Addendum)
SURGICAL WAITING ROOM VISITATION Patients having surgery or a procedure may have no more than 2 support people in the waiting area - these visitors may rotate.   Children under the age of 71 must have an adult with them who is not the patient. If the patient needs to stay at the hospital during part of their recovery, the visitor guidelines for inpatient rooms apply. Pre-op nurse will coordinate an appropriate time for 1 support person to accompany patient in pre-op.  This support person may not rotate.    Please refer to the [facility] website for the visitor guidelines for Inpatients (after your surgery is over and you are in a regular room).      Your procedure is scheduled on: 05-03-22   Report to [facility] Main Entrance    Report to admitting at 5:15 AM   Call this number if you have problems the morning of surgery [phone]   Do not eat food :After Midnight.   After Midnight you may have the following liquids until 4:30 AM DAY OF SURGERY  Water Non-Citrus Juices (without pulp, NO RED) Carbonated Beverages Black Coffee (NO MILK/CREAM OR CREAMERS, sugar ok)  Clear Tea (NO MILK/CREAM OR CREAMERS, sugar ok) regular and decaf                             Plain Jell-O (NO RED)                                           Fruit ices (not with fruit pulp, NO RED)                                     Popsicles (NO RED)                                                               Sports drinks like Gatorade (NO RED)                   The day of surgery:  Drink ONE (1) Pre-Surgery Clear Ensure at 4:30 AM the morning of surgery. Drink in one sitting. Do not sip.  This drink was given to you during your hospital  pre-op appointment visit. Nothing else to drink after completing the Pre-Surgery Clear Ensure           If you have questions, please contact your surgeon's office.   FOLLOW ANY ADDITIONAL PRE OP INSTRUCTIONS YOU RECEIVED FROM YOUR SURGEON'S OFFICE!!!     Oral  Hygiene is also important to reduce your risk of infection.                                    Remember - BRUSH YOUR TEETH THE MORNING OF SURGERY WITH YOUR REGULAR TOOTHPASTE   Do NOT smoke after Midnight   Take these medicines the morning of surgery with A SIP OF WATER:   Metoprolol  Omeprazole  Bring CPAP mask and tubing day  of surgery.                              You may not have any metal on your body including hair pins, jewelry, and body piercing             Do not wear make-up, lotions, powders, perfumes or deodorant  Do not wear nail polish including gel and S&S, artificial/acrylic nails, or any other type of covering on natural nails including finger and toenails. If you have artificial nails, gel coating, etc. that needs to be removed by a nail salon please have this removed prior to surgery or surgery may need to be canceled/ delayed if the surgeon/ anesthesia feels like they are unable to be safely monitored.   Do not shave  48 hours prior to surgery.        Do not bring valuables to the hospital. [place].   Contacts, dentures or bridgework may not be worn into surgery.   Bring small overnight bag day of surgery.   DO NOT [place]. PHARMACY WILL DISPENSE MEDICATIONS LISTED ON YOUR MEDICATION LIST TO YOU DURING YOUR ADMISSION [place]!   Special Instructions: Bring a copy of your healthcare power of attorney and living will documents the day of surgery if you haven't scanned them before.  Please read over the following fact sheets you were given: IF [place]  If you received a COVID test during your pre-op visit  it is requested that you wear a mask when out in public, stay away from anyone that may not be feeling well and notify your surgeon if you develop symptoms. If you test positive for Covid or have been in contact  with anyone that has tested positive in the last 10 days please notify you surgeon.  [place] - Preparing for Surgery Before surgery, you can play an important role.  Because skin is not sterile, your skin needs to be as free of germs as possible.  You can reduce the number of germs on your skin by washing with CHG (chlorahexidine gluconate) soap before surgery.  CHG is an antiseptic cleaner which kills germs and bonds with the skin to continue killing germs even after washing. Please DO NOT use if you have an allergy to CHG or antibacterial soaps.  If your skin becomes reddened/irritated stop using the CHG and inform your nurse when you arrive at Short Stay. Do not shave (including legs and underarms) for at least 48 hours prior to the first CHG shower.  You may shave your face/neck.  Please follow these instructions carefully:  1.  Shower with CHG Soap the night before surgery and the  morning of surgery.  2.  If you choose to wash your hair, wash your hair first as usual with your normal  shampoo.  3.  After you shampoo, rinse your hair and body thoroughly to remove the shampoo.                             4.  Use CHG as you would any other liquid soap.  You can apply chg directly to the skin and wash.  Gently with a scrungie or clean washcloth.  5.  Apply the CHG Soap to your body ONLY  FROM THE NECK DOWN.   Do   not use on face/ open                           Wound or open sores. Avoid contact with eyes, ears mouth and   genitals (private parts).                       Wash face,  Genitals (private parts) with your normal soap.             6.  Wash thoroughly, paying special attention to the area where your    surgery  will be performed.  7.  Thoroughly rinse your body with warm water from the neck down.  8.  DO NOT shower/wash with your normal soap after using and rinsing off the CHG Soap.                9.  Pat yourself dry with a clean towel.            10.  Wear clean pajamas.             11.  Place clean sheets on your bed the night of your first shower and do not  sleep with pets. Day of Surgery : Do not apply any lotions/deodorants the morning of surgery.  Please wear clean clothes to the hospital/surgery center.  FAILURE TO FOLLOW THESE INSTRUCTIONS MAY RESULT IN THE CANCELLATION OF YOUR SURGERY  PATIENT SIGNATURE_________________________________  NURSE SIGNATURE__________________________________  ________________________________________________________________________    Mandy Castillo  An incentive spirometer is a tool that can help keep your lungs clear and active. This tool measures how well you are filling your lungs with each breath. Taking long deep breaths may help reverse or decrease the chance of developing breathing (pulmonary) problems (especially infection) following: A long period of time when you are unable to move or be active. BEFORE THE PROCEDURE  If the spirometer includes an indicator to show your best effort, your nurse or respiratory therapist will set it to a desired goal. If possible, sit up straight or lean slightly forward. Try not to slouch. Hold the incentive spirometer in an upright position. INSTRUCTIONS FOR USE  Sit on the edge of your bed if possible, or sit up as far as you can in bed or on a chair. Hold the incentive spirometer in an upright position. Breathe out normally. Place the mouthpiece in your mouth and seal your lips tightly around it. Breathe in slowly and as deeply as possible, raising the piston or the ball toward the top of the column. Hold your breath for 3-5 seconds or for as long as possible. Allow the piston or ball to fall to the bottom of the column. Remove the mouthpiece from your mouth and breathe out normally. Rest for a few seconds and repeat Steps 1 through 7 at least 10 times every 1-2 hours when you are awake. Take your time and take a few normal breaths between deep breaths. The spirometer may  include an indicator to show your best effort. Use the indicator as a goal to work toward during each repetition. After each set of 10 deep breaths, practice coughing to be sure your lungs are clear. If you have an incision (the cut made at the time of surgery), support your incision when coughing by placing a pillow or rolled up towels firmly against it. Once you are able to get out  of bed, walk around indoors and cough well. You may stop using the incentive spirometer when instructed by your caregiver.  RISKS AND COMPLICATIONS Take your time so you do not get dizzy or light-headed. If you are in pain, you may need to take or ask for pain medication before doing incentive spirometry. It is harder to take a deep breath if you are having pain. AFTER USE Rest and breathe slowly and easily. It can be helpful to keep track of a log of your progress. Your caregiver can provide you with a simple table to help with this. If you are using the spirometer at home, follow these instructions: [place] IF:  You are having difficultly using the spirometer. You have trouble using the spirometer as often as instructed. Your pain medication is not giving enough relief while using the spirometer. You develop fever of 100.5 F (38.1 C) or higher. SEEK IMMEDIATE MEDICAL CARE IF:  You cough up bloody sputum that had not been present before. You develop fever of 102 F (38.9 C) or greater. You develop worsening pain at or near the incision site. MAKE SURE YOU:  Understand these instructions. Will watch your condition. Will get help right away if you are not doing well or get worse. Document Released: 11/22/2006 Document Revised: 10/04/2011 Document Reviewed: 01/23/2007 [facility] Patient Information 2014 [place], [place].   ________________________________________________________________________

## 2022-04-20 ENCOUNTER — Ambulatory Visit (HOSPITAL_COMMUNITY)
Admission: RE | Admit: 2022-04-20 | Discharge: 2022-04-20 | Disposition: A | Discharge status: Home / Self Care | Payer: Medicare Other | Source: Ambulatory Visit | Attending: Orthopedic Surgery | Admitting: Orthopedic Surgery

## 2022-04-20 ENCOUNTER — Encounter (HOSPITAL_COMMUNITY)
Admission: RE | Admit: 2022-04-20 | Discharge: 2022-04-20 | Disposition: A | Discharge status: Home / Self Care | Payer: Medicare Other | Source: Ambulatory Visit | Attending: Orthopedic Surgery | Admitting: Orthopedic Surgery

## 2022-04-20 ENCOUNTER — Encounter (HOSPITAL_COMMUNITY): Payer: Self-pay

## 2022-04-20 ENCOUNTER — Other Ambulatory Visit: Payer: Self-pay

## 2022-04-20 VITALS — BP 169/78 | HR 62 | Temp 98.2°F | Resp 14 | Ht 68.0 in | Wt 170.8 lb

## 2022-04-20 DIAGNOSIS — Z01818 Encounter for other preprocedural examination: Secondary | ICD-10-CM | POA: Insufficient documentation

## 2022-04-20 DIAGNOSIS — I2510 Atherosclerotic heart disease of native coronary artery without angina pectoris: Secondary | ICD-10-CM | POA: Insufficient documentation

## 2022-04-20 HISTORY — DX: Unspecified osteoarthritis, unspecified site: M19.90

## 2022-04-20 HISTORY — DX: Gastro-esophageal reflux disease without esophagitis: K21.9

## 2022-04-20 HISTORY — DX: Essential (primary) hypertension: I10

## 2022-04-20 LAB — CBC
HCT: 41.4 % (ref 36.0–46.0)
Hemoglobin: 13.2 g/dL (ref 12.0–15.0)
MCH: 29.3 pg (ref 26.0–34.0)
MCHC: 31.9 g/dL (ref 30.0–36.0)
MCV: 91.8 fL (ref 80.0–100.0)
Platelets: 302 10*3/uL (ref 150–400)
RBC: 4.51 MIL/uL (ref 3.87–5.11)
RDW: 12.6 % (ref 11.5–15.5)
WBC: 4.6 10*3/uL (ref 4.0–10.5)
nRBC: 0 % (ref 0.0–0.2)

## 2022-04-20 LAB — SURGICAL PCR SCREEN
MRSA, PCR: NEGATIVE
Staphylococcus aureus: NEGATIVE

## 2022-04-20 LAB — BASIC METABOLIC PANEL
Anion gap: 5 (ref 5–15)
BUN: 11 mg/dL (ref 8–23)
CO2: 27 mmol/L (ref 22–32)
Calcium: 9.6 mg/dL (ref 8.9–10.3)
Chloride: 108 mmol/L (ref 98–111)
Creatinine, Ser: 0.78 mg/dL (ref 0.44–1.00)
GFR, Estimated: >60 mL/min (ref >60)
Glucose, Bld: 106 mg/dL — ABNORMAL HIGH (ref 70–99)
Potassium: 4.2 mmol/L (ref 3.5–5.1)
Sodium: 140 mmol/L (ref 135–145)

## 2022-04-20 NOTE — Progress Notes (Addendum)
Patient being seen by neurology for changes in memory.  Patient states that she will forget why she goes in a room or gets tasks confused, she questions if she did the task corrrectly.  Patient was able to tell me name, DOB, date, day of week and what surgery she was having.  She was also able to tell me the names of her PCP and cardiologist.   Patent was able to sign her consent form.

## 2022-04-22 DIAGNOSIS — M1732 Unilateral post-traumatic osteoarthritis, left knee: Secondary | ICD-10-CM | POA: Diagnosis not present

## 2022-04-22 DIAGNOSIS — R262 Difficulty in walking, not elsewhere classified: Secondary | ICD-10-CM | POA: Diagnosis not present

## 2022-04-22 DIAGNOSIS — M25662 Stiffness of left knee, not elsewhere classified: Secondary | ICD-10-CM | POA: Diagnosis not present

## 2022-04-22 NOTE — Progress Notes (Signed)
Anesthesia Chart Review   Case: [id number] Date/Time: 05/03/22 0715   Procedure: TOTAL KNEE ARTHROPLASTY (Left: Knee)   Anesthesia type: Spinal   Pre-op diagnosis: OSTEOARTHRITIS LEFT KNEE   Location: [place] 06 / [facility]   Surgeons: Dorna Leitz, MD       DISCUSSION:81 y.o. never smoker with h/o HTN, SVT ,left knee OA scheduled for above procedure 05/03/2022 with Dr. Dorna Leitz.   Pt last seen by cardiology 03/18/2022. Per OV note, "Preoperative cardiac exam: According to the Revised Cardiac Risk Index (RCRI), her Perioperative Risk of Major Cardiac Event is (%): 0.4. Her Functional Capacity in METs is: 7.99 according to the Duke Activity Status Index (DASI). Therefore, based on ACC/AHA guidelines, patient would be at acceptable risk for the planned procedure without further cardiovascular testing. I will route this recommendation to the requesting party via Epic fax function"  Anticipate pt can proceed with planned procedure barring acute status change.   VS: BP (!) 169/78   Pulse 62   Temp 36.8 C (Oral)   Resp 14   Ht 5\' 8"  (1.727 m)   Wt 77.5 kg   SpO2 100%   BMI 25.97 kg/m   PROVIDERS: Lesleigh Noe, MD is PCP   Primary Cardiologist:  Shelva Majestic, MD LABS: Labs reviewed: Acceptable for surgery. (all labs ordered are listed, but only abnormal results are displayed)  Labs Reviewed  BASIC METABOLIC PANEL - Abnormal; Notable for the following components:      Result Value   Glucose, Bld 106 (*)    All other components within normal limits  SURGICAL PCR SCREEN  CBC     IMAGES:   EKG:   CV: Echo 03/28/2019  1. The left ventricle has normal systolic function with an ejection  fraction of 60-65%. The cavity size was normal. There is mildly increased  left ventricular wall thickness. Left ventricular diastolic Doppler  parameters are consistent with impaired  relaxation. No evidence of left ventricular regional wall motion  abnormalities.   2. The right ventricle  has normal systolic function. The cavity was  normal. There is no increase in right ventricular wall thickness.   3. The pericardial effusion is circumferential.   4. Trivial pericardial effusion is present.   5. The mitral valve is grossly normal. Mild thickening of the mitral  valve leaflet.   6. The tricuspid valve is grossly normal.   7. The aortic valve is tricuspid. Mild thickening of the aortic valve. No  stenosis of the aortic valve.   8. The aorta is normal unless otherwise noted.   9. The aortic root is normal in size and structure.  10. When compared to the prior study: No prior study.  Past Medical History:  Diagnosis Date   Arthritis    GERD (gastroesophageal reflux disease)    Hypertension    Sebaceous cyst 07/24/2020    Past Surgical History:  Procedure Laterality Date   ABDOMINAL HYSTERECTOMY     APPENDECTOMY     BREAST SURGERY     cyst removed   CHOLECYSTECTOMY     CYST REMOVAL NECK      MEDICATIONS:  aspirin EC 81 MG tablet   Cholecalciferol (D3 PO)   Cyanocobalamin (B-12 PO)   donepezil (ARICEPT) 10 MG tablet   metoprolol succinate (TOPROL-XL) 50 MG 24 hr tablet   omeprazole (PRILOSEC) 20 MG capsule   No current facility-administered medications for this encounter.   Konrad Felix Ward, PA-C [facility] Pre-Surgical Testing [phone]

## 2022-04-27 NOTE — Care Plan (Signed)
Ortho Bundle Case Management Note  Patient Details  Name: Mandy Castillo MRN: [id number] Date of Birth: 22-Jul-1941   Spoke with patient prior to surgery. She will discharge to home with family to assist. Rolling walker ordered for home use. HHPT referral to [facility] and OPPT set up with [facility].  Patient and MD in agreement with plan. Choice offered                   DME Arranged:  Walker rolling DME Agency:  [employer]  HH Arranged:  PT HH Agency:  [place]  Additional Comments: Please contact me with any questions of if this plan should need to change.  Ladell Heads,  [place] [facility] Specialist  [phone] 04/27/2022, 4:35 PM

## 2022-05-01 NOTE — Anesthesia Preprocedure Evaluation (Signed)
Anesthesia Evaluation  Patient identified by MRN, date of birth, ID band Patient awake    Reviewed: Allergy & Precautions, NPO status , Patient's Chart, lab work & pertinent test results, reviewed documented beta blocker date and time   History of Anesthesia Complications Negative for: history of anesthetic complications  Airway Mallampati: II  TM Distance: >3 FB Neck ROM: Full    Dental no notable dental hx.    Pulmonary neg pulmonary ROS,    Pulmonary exam normal        Cardiovascular hypertension, Pt. on medications and Pt. on home beta blockers Normal cardiovascular exam+ dysrhythmias Atrial Fibrillation   Echo 03/28/2019: EF 60-65%, mild LVH, valves ok   Neuro/Psych negative neurological ROS  negative psych ROS   GI/Hepatic Neg liver ROS, GERD  Medicated and Controlled,  Endo/Other  negative endocrine ROS  Renal/GU negative Renal ROS  negative genitourinary   Musculoskeletal  (+) Arthritis ,   Abdominal   Peds  Hematology negative hematology ROS (+)   Anesthesia Other Findings Day of surgery medications reviewed with patient.  Reproductive/Obstetrics negative OB ROS                           Anesthesia Physical Anesthesia Plan  ASA: 2  Anesthesia Plan: Spinal   Post-op Pain Management: Tylenol PO (pre-op)* and Regional block*   Induction:   PONV Risk Score and Plan: 3 and Treatment may vary due to age or medical condition, Ondansetron, Propofol infusion and Dexamethasone  Airway Management Planned: Natural Airway and Simple Face Mask  Additional Equipment: None  Intra-op Plan:   Post-operative Plan:   Informed Consent: I have reviewed the patients History and Physical, chart, labs and discussed the procedure including the risks, benefits and alternatives for the proposed anesthesia with the patient or authorized representative who has indicated his/her understanding and  acceptance.       Plan Discussed with: CRNA  Anesthesia Plan Comments:        Anesthesia Quick Evaluation

## 2022-05-03 ENCOUNTER — Observation Stay (HOSPITAL_COMMUNITY)
Admission: RE | Admit: 2022-05-03 | Discharge: 2022-05-05 | Disposition: A | Discharge status: Home Health | Payer: Medicare Other | Source: Ambulatory Visit | Attending: Orthopedic Surgery | Admitting: Orthopedic Surgery

## 2022-05-03 ENCOUNTER — Other Ambulatory Visit: Payer: Self-pay

## 2022-05-03 ENCOUNTER — Encounter (HOSPITAL_COMMUNITY)
Admission: RE | Disposition: A | Discharge status: Home Health | Payer: Self-pay | Source: Ambulatory Visit | Attending: Orthopedic Surgery

## 2022-05-03 ENCOUNTER — Ambulatory Visit (HOSPITAL_COMMUNITY): Payer: Medicare Other | Admitting: Physician Assistant

## 2022-05-03 ENCOUNTER — Ambulatory Visit (HOSPITAL_BASED_OUTPATIENT_CLINIC_OR_DEPARTMENT_OTHER): Payer: Medicare Other | Admitting: Anesthesiology

## 2022-05-03 ENCOUNTER — Encounter (HOSPITAL_COMMUNITY): Payer: Self-pay | Admitting: Orthopedic Surgery

## 2022-05-03 DIAGNOSIS — M1712 Unilateral primary osteoarthritis, left knee: Secondary | ICD-10-CM | POA: Diagnosis not present

## 2022-05-03 DIAGNOSIS — Z96652 Presence of left artificial knee joint: Secondary | ICD-10-CM

## 2022-05-03 DIAGNOSIS — R7309 Other abnormal glucose: Secondary | ICD-10-CM | POA: Diagnosis not present

## 2022-05-03 DIAGNOSIS — M21062 Valgus deformity, not elsewhere classified, left knee: Secondary | ICD-10-CM

## 2022-05-03 DIAGNOSIS — S83142A Lateral subluxation of proximal end of tibia, left knee, initial encounter: Secondary | ICD-10-CM

## 2022-05-03 DIAGNOSIS — G8918 Other acute postprocedural pain: Secondary | ICD-10-CM | POA: Diagnosis not present

## 2022-05-03 DIAGNOSIS — I4892 Unspecified atrial flutter: Secondary | ICD-10-CM | POA: Insufficient documentation

## 2022-05-03 DIAGNOSIS — I10 Essential (primary) hypertension: Secondary | ICD-10-CM | POA: Diagnosis not present

## 2022-05-03 HISTORY — PX: TOTAL KNEE ARTHROPLASTY: SHX125

## 2022-05-03 SURGERY — ARTHROPLASTY, KNEE, TOTAL
Anesthesia: Spinal | Site: Knee | Laterality: Left

## 2022-05-03 MED ORDER — LACTATED RINGERS IV BOLUS
250.0000 mL | Freq: Once | INTRAVENOUS | Status: AC
  Administered 2022-05-03: 250 mL via INTRAVENOUS

## 2022-05-03 MED ORDER — ONDANSETRON HCL 4 MG/2ML IJ SOLN
INTRAMUSCULAR | Status: DC | PRN
  Administered 2022-05-03: 4 mg via INTRAVENOUS

## 2022-05-03 MED ORDER — ASPIRIN 325 MG PO TBEC
325.0000 mg | DELAYED_RELEASE_TABLET | Freq: Two times a day (BID) | ORAL | Status: DC
  Administered 2022-05-04 – 2022-05-05 (×3): 325 mg via ORAL
  Filled 2022-05-03 (×3): qty 1

## 2022-05-03 MED ORDER — CHLORHEXIDINE GLUCONATE 0.12 % MT SOLN
15.0000 mL | Freq: Once | OROMUCOSAL | Status: AC
  Administered 2022-05-03: 15 mL via OROMUCOSAL

## 2022-05-03 MED ORDER — HYDROCODONE-ACETAMINOPHEN 5-325 MG PO TABS
1.0000 | ORAL_TABLET | ORAL | Status: DC | PRN
  Administered 2022-05-03 – 2022-05-04 (×4): 1 via ORAL
  Filled 2022-05-03 (×4): qty 1

## 2022-05-03 MED ORDER — SODIUM CHLORIDE 0.9 % IV SOLN: INTRAVENOUS | Status: DC

## 2022-05-03 MED ORDER — ACETAMINOPHEN 325 MG PO TABS
325.0000 mg | ORAL_TABLET | Freq: Four times a day (QID) | ORAL | Status: DC | PRN
  Administered 2022-05-04: 325 mg via ORAL
  Filled 2022-05-03: qty 1

## 2022-05-03 MED ORDER — ACETAMINOPHEN 500 MG PO TABS
1000.0000 mg | ORAL_TABLET | Freq: Once | ORAL | Status: AC
  Administered 2022-05-03: 1000 mg via ORAL
  Filled 2022-05-03: qty 2

## 2022-05-03 MED ORDER — BUPIVACAINE IN DEXTROSE 0.75-8.25 % IT SOLN
INTRATHECAL | Status: DC | PRN
  Administered 2022-05-03: 1.6 mL via INTRATHECAL

## 2022-05-03 MED ORDER — PROPOFOL 10 MG/ML IV BOLUS
INTRAVENOUS | Status: DC | PRN
  Administered 2022-05-03: 20 mg via INTRAVENOUS

## 2022-05-03 MED ORDER — PHENYLEPHRINE HCL (PRESSORS) 10 MG/ML IV SOLN
INTRAVENOUS | Status: AC
  Filled 2022-05-03: qty 1

## 2022-05-03 MED ORDER — OXYCODONE HCL 5 MG PO TABS
5.0000 mg | ORAL_TABLET | Freq: Once | ORAL | Status: AC | PRN
  Administered 2022-05-03: 5 mg via ORAL

## 2022-05-03 MED ORDER — DOCUSATE SODIUM 100 MG PO CAPS
100.0000 mg | ORAL_CAPSULE | Freq: Two times a day (BID) | ORAL | Status: DC
  Administered 2022-05-03 – 2022-05-05 (×4): 100 mg via ORAL
  Filled 2022-05-03 (×4): qty 1

## 2022-05-03 MED ORDER — FENTANYL CITRATE (PF) 100 MCG/2ML IJ SOLN
INTRAMUSCULAR | Status: AC
  Filled 2022-05-03: qty 2

## 2022-05-03 MED ORDER — ASPIRIN 325 MG PO TBEC: 325.0000 mg | DELAYED_RELEASE_TABLET | Freq: Two times a day (BID) | ORAL | 0 refills | Status: DC

## 2022-05-03 MED ORDER — TIZANIDINE HCL 2 MG PO TABS: 2.0000 mg | ORAL_TABLET | Freq: Three times a day (TID) | ORAL | 0 refills | Status: DC | PRN

## 2022-05-03 MED ORDER — MORPHINE SULFATE (PF) 2 MG/ML IV SOLN
INTRAVENOUS | Status: AC
  Filled 2022-05-03: qty 1

## 2022-05-03 MED ORDER — SODIUM CHLORIDE (PF) 0.9 % IJ SOLN
INTRAMUSCULAR | Status: AC
  Filled 2022-05-03: qty 50

## 2022-05-03 MED ORDER — BUPIVACAINE-EPINEPHRINE (PF) 0.5% -1:200000 IJ SOLN
INTRAMUSCULAR | Status: AC
  Filled 2022-05-03: qty 30

## 2022-05-03 MED ORDER — DEXAMETHASONE SODIUM PHOSPHATE 10 MG/ML IJ SOLN
10.0000 mg | Freq: Two times a day (BID) | INTRAMUSCULAR | Status: AC
  Administered 2022-05-03: 10 mg via INTRAVENOUS
  Filled 2022-05-03: qty 1

## 2022-05-03 MED ORDER — CLONIDINE HCL (ANALGESIA) 100 MCG/ML EP SOLN
EPIDURAL | Status: DC | PRN
  Administered 2022-05-03: 100 ug

## 2022-05-03 MED ORDER — MIDAZOLAM HCL 5 MG/5ML IJ SOLN
INTRAMUSCULAR | Status: DC | PRN
  Administered 2022-05-03: 1 mg via INTRAVENOUS

## 2022-05-03 MED ORDER — PROPOFOL 1000 MG/100ML IV EMUL
INTRAVENOUS | Status: AC
  Filled 2022-05-03: qty 100

## 2022-05-03 MED ORDER — 0.9 % SODIUM CHLORIDE (POUR BTL) OPTIME
TOPICAL | Status: DC | PRN
  Administered 2022-05-03: 1000 mL

## 2022-05-03 MED ORDER — ORAL CARE MOUTH RINSE: 15.0000 mL | Freq: Once | OROMUCOSAL | Status: AC

## 2022-05-03 MED ORDER — EPHEDRINE 5 MG/ML INJ
INTRAVENOUS | Status: AC
  Filled 2022-05-03: qty 5

## 2022-05-03 MED ORDER — SODIUM CHLORIDE (PF) 0.9 % IJ SOLN
INTRAMUSCULAR | Status: DC | PRN
  Administered 2022-05-03: 50 mL

## 2022-05-03 MED ORDER — POVIDONE-IODINE 10 % EX SWAB
2.0000 | Freq: Once | CUTANEOUS | Status: AC
  Administered 2022-05-03: 2 via TOPICAL

## 2022-05-03 MED ORDER — ACETAMINOPHEN 500 MG PO TABS
500.0000 mg | ORAL_TABLET | Freq: Four times a day (QID) | ORAL | Status: AC
  Administered 2022-05-03 – 2022-05-04 (×3): 500 mg via ORAL
  Filled 2022-05-03 (×2): qty 1

## 2022-05-03 MED ORDER — BUPIVACAINE-EPINEPHRINE 0.5% -1:200000 IJ SOLN
INTRAMUSCULAR | Status: DC | PRN
  Administered 2022-05-03: 30 mL

## 2022-05-03 MED ORDER — EPHEDRINE SULFATE-NACL 50-0.9 MG/10ML-% IV SOSY
PREFILLED_SYRINGE | INTRAVENOUS | Status: DC | PRN
  Administered 2022-05-03 (×3): 5 mg via INTRAVENOUS

## 2022-05-03 MED ORDER — BUPIVACAINE-EPINEPHRINE (PF) 0.5% -1:200000 IJ SOLN
INTRAMUSCULAR | Status: DC | PRN
  Administered 2022-05-03: 15 mL via PERINEURAL

## 2022-05-03 MED ORDER — METHOCARBAMOL 500 MG IVPB - SIMPLE MED
INTRAVENOUS | Status: AC
  Filled 2022-05-03: qty 55

## 2022-05-03 MED ORDER — BUPIVACAINE LIPOSOME 1.3 % IJ SUSP: 20.0000 mL | Freq: Once | INTRAMUSCULAR | Status: DC

## 2022-05-03 MED ORDER — FENTANYL CITRATE (PF) 100 MCG/2ML IJ SOLN
INTRAMUSCULAR | Status: DC | PRN
  Administered 2022-05-03: 50 ug via INTRAVENOUS

## 2022-05-03 MED ORDER — FENTANYL CITRATE PF 50 MCG/ML IJ SOSY: 25.0000 ug | PREFILLED_SYRINGE | INTRAMUSCULAR | Status: DC | PRN

## 2022-05-03 MED ORDER — TRANEXAMIC ACID-NACL 1000-0.7 MG/100ML-% IV SOLN
1000.0000 mg | Freq: Once | INTRAVENOUS | Status: AC
  Administered 2022-05-03: 1000 mg via INTRAVENOUS
  Filled 2022-05-03: qty 100

## 2022-05-03 MED ORDER — TRANEXAMIC ACID-NACL 1000-0.7 MG/100ML-% IV SOLN
1000.0000 mg | INTRAVENOUS | Status: AC
  Administered 2022-05-03: 1000 mg via INTRAVENOUS
  Filled 2022-05-03: qty 100

## 2022-05-03 MED ORDER — ONDANSETRON HCL 4 MG PO TABS
4.0000 mg | ORAL_TABLET | Freq: Four times a day (QID) | ORAL | Status: DC | PRN
  Filled 2022-05-03: qty 1

## 2022-05-03 MED ORDER — ALUM & MAG HYDROXIDE-SIMETH 200-200-20 MG/5ML PO SUSP: 30.0000 mL | ORAL | Status: DC | PRN

## 2022-05-03 MED ORDER — BUPIVACAINE LIPOSOME 1.3 % IJ SUSP
INTRAMUSCULAR | Status: AC
  Filled 2022-05-03: qty 20

## 2022-05-03 MED ORDER — WATER FOR IRRIGATION, STERILE IR SOLN
Status: DC | PRN
  Administered 2022-05-03: 2000 mL

## 2022-05-03 MED ORDER — ONDANSETRON HCL 4 MG/2ML IJ SOLN
INTRAMUSCULAR | Status: AC
  Filled 2022-05-03: qty 2

## 2022-05-03 MED ORDER — PROPOFOL 500 MG/50ML IV EMUL
INTRAVENOUS | Status: DC | PRN
  Administered 2022-05-03: 25 ug/kg/min via INTRAVENOUS

## 2022-05-03 MED ORDER — LACTATED RINGERS IV SOLN: INTRAVENOUS | Status: DC

## 2022-05-03 MED ORDER — OXYCODONE HCL 5 MG/5ML PO SOLN: 5.0000 mg | Freq: Once | ORAL | Status: AC | PRN

## 2022-05-03 MED ORDER — DEXAMETHASONE SODIUM PHOSPHATE 10 MG/ML IJ SOLN
INTRAMUSCULAR | Status: AC
  Filled 2022-05-03: qty 1

## 2022-05-03 MED ORDER — CEFAZOLIN SODIUM-DEXTROSE 2-4 GM/100ML-% IV SOLN
2.0000 g | INTRAVENOUS | Status: AC
  Administered 2022-05-03: 2 g via INTRAVENOUS
  Filled 2022-05-03: qty 100

## 2022-05-03 MED ORDER — MIDAZOLAM HCL 2 MG/2ML IJ SOLN
INTRAMUSCULAR | Status: AC
  Filled 2022-05-03: qty 2

## 2022-05-03 MED ORDER — DEXAMETHASONE SODIUM PHOSPHATE 10 MG/ML IJ SOLN
INTRAMUSCULAR | Status: DC | PRN
  Administered 2022-05-03: 10 mg via INTRAVENOUS

## 2022-05-03 MED ORDER — OXYCODONE HCL 5 MG PO TABS
ORAL_TABLET | ORAL | Status: AC
  Filled 2022-05-03: qty 1

## 2022-05-03 MED ORDER — LACTATED RINGERS IV BOLUS
500.0000 mL | Freq: Once | INTRAVENOUS | Status: AC
  Administered 2022-05-03: 500 mL via INTRAVENOUS

## 2022-05-03 MED ORDER — ONDANSETRON HCL 4 MG/2ML IJ SOLN: 4.0000 mg | Freq: Four times a day (QID) | INTRAMUSCULAR | Status: DC | PRN

## 2022-05-03 MED ORDER — BUPIVACAINE LIPOSOME 1.3 % IJ SUSP
INTRAMUSCULAR | Status: DC | PRN
  Administered 2022-05-03: 20 mL

## 2022-05-03 MED ORDER — CEFAZOLIN SODIUM-DEXTROSE 2-4 GM/100ML-% IV SOLN
2.0000 g | Freq: Four times a day (QID) | INTRAVENOUS | Status: AC
  Administered 2022-05-03: 2 g via INTRAVENOUS
  Filled 2022-05-03: qty 100

## 2022-05-03 MED ORDER — HYDROCODONE-ACETAMINOPHEN 5-325 MG PO TABS: 1.0000 | ORAL_TABLET | Freq: Four times a day (QID) | ORAL | 0 refills | Status: DC | PRN

## 2022-05-03 MED ORDER — MORPHINE SULFATE (PF) 2 MG/ML IV SOLN
0.5000 mg | INTRAVENOUS | Status: DC | PRN
  Administered 2022-05-03: 1 mg via INTRAVENOUS

## 2022-05-03 MED ORDER — PHENYLEPHRINE HCL-NACL 20-0.9 MG/250ML-% IV SOLN
INTRAVENOUS | Status: DC | PRN
  Administered 2022-05-03: 40 ug/min via INTRAVENOUS

## 2022-05-03 MED ORDER — DOCUSATE SODIUM 100 MG PO CAPS: 100.0000 mg | ORAL_CAPSULE | Freq: Two times a day (BID) | ORAL | 0 refills | Status: DC

## 2022-05-03 MED ORDER — SODIUM CHLORIDE 0.9 % IR SOLN
Status: DC | PRN
  Administered 2022-05-03: 1000 mL

## 2022-05-03 SURGICAL SUPPLY — 54 items
ATTUNE MED DOME PAT 38 KNEE (Knees) IMPLANT
ATTUNE PS FEM LT SZ 6 CEM KNEE (Femur) IMPLANT
ATTUNE PSRP INSR SZ6 6 KNEE (Insert) IMPLANT
BAG COUNTER SPONGE SURGICOUNT (BAG) IMPLANT
BAG ZIPLOCK 12X15 (MISCELLANEOUS) ×1 IMPLANT
BASE TIBIAL ROT PLAT SZ 7 KNEE (Knees) IMPLANT
BENZOIN TINCTURE PRP APPL 2/3 (GAUZE/BANDAGES/DRESSINGS) ×1 IMPLANT
BLADE SAGITTAL 25.0X1.19X90 (BLADE) ×1 IMPLANT
BLADE SAW SGTL 13.0X1.19X90.0M (BLADE) ×1 IMPLANT
BLADE SURG SZ10 CARB STEEL (BLADE) ×2 IMPLANT
BNDG ELASTIC 6X5.8 VLCR STR LF (GAUZE/BANDAGES/DRESSINGS) ×1 IMPLANT
BOOTIES KNEE HIGH SLOAN (MISCELLANEOUS) ×1 IMPLANT
BOWL SMART MIX CTS (DISPOSABLE) ×1 IMPLANT
CEMENT HV SMART SET (Cement) ×2 IMPLANT
CLSR STERI-STRIP ANTIMIC 1/2X4 (GAUZE/BANDAGES/DRESSINGS) IMPLANT
COVER SURGICAL LIGHT HANDLE (MISCELLANEOUS) ×1 IMPLANT
CUFF TOURN SGL QUICK 34 (TOURNIQUET CUFF) ×1
CUFF TRNQT CYL 34X4.125X (TOURNIQUET CUFF) ×1 IMPLANT
DRAPE INCISE IOBAN 66X45 STRL (DRAPES) ×1 IMPLANT
DRAPE U-SHAPE 47X51 STRL (DRAPES) ×1 IMPLANT
DRSG AQUACEL AG ADV 3.5X10 (GAUZE/BANDAGES/DRESSINGS) ×1 IMPLANT
DURAPREP 26ML APPLICATOR (WOUND CARE) ×1 IMPLANT
ELECT REM PT RETURN 15FT ADLT (MISCELLANEOUS) ×1 IMPLANT
GLOVE BIOGEL PI IND STRL 8 (GLOVE) ×2 IMPLANT
GLOVE ECLIPSE 7.5 STRL STRAW (GLOVE) ×2 IMPLANT
GOWN STRL REUS W/ TWL XL LVL3 (GOWN DISPOSABLE) ×2 IMPLANT
GOWN STRL REUS W/TWL XL LVL3 (GOWN DISPOSABLE) ×2
HANDPIECE INTERPULSE COAX TIP (DISPOSABLE) ×1
HOLDER FOLEY CATH W/STRAP (MISCELLANEOUS) IMPLANT
HOOD PEEL AWAY FLYTE STAYCOOL (MISCELLANEOUS) ×3 IMPLANT
IMMOBILIZER KNEE 20 (SOFTGOODS) ×1
IMMOBILIZER KNEE 20 THIGH 36 (SOFTGOODS) IMPLANT
KIT TURNOVER KIT A (KITS) IMPLANT
MANIFOLD NEPTUNE II (INSTRUMENTS) ×1 IMPLANT
NEEDLE HYPO 22GX1.5 SAFETY (NEEDLE) ×2 IMPLANT
NS IRRIG 1000ML POUR BTL (IV SOLUTION) ×1 IMPLANT
PACK TOTAL KNEE CUSTOM (KITS) ×1 IMPLANT
PADDING CAST COTTON 6X4 STRL (CAST SUPPLIES) ×1 IMPLANT
PROTECTOR NERVE ULNAR (MISCELLANEOUS) ×1 IMPLANT
SET HNDPC FAN SPRY TIP SCT (DISPOSABLE) ×1 IMPLANT
SPIKE FLUID TRANSFER (MISCELLANEOUS) ×2 IMPLANT
STRIP CLOSURE SKIN 1/2X4 (GAUZE/BANDAGES/DRESSINGS) IMPLANT
SUT MNCRL AB 3-0 PS2 18 (SUTURE) ×1 IMPLANT
SUT MON AB 4-0 PS1 27 (SUTURE) IMPLANT
SUT VIC AB 0 CT1 36 (SUTURE) ×1 IMPLANT
SUT VIC AB 1 CT1 36 (SUTURE) ×2 IMPLANT
SUT VIC AB 2-0 CT1 27 (SUTURE) ×2
SUT VIC AB 2-0 CT1 TAPERPNT 27 (SUTURE) IMPLANT
SYR CONTROL 10ML LL (SYRINGE) ×2 IMPLANT
TIBIAL BASE ROT PLAT SZ 7 KNEE (Knees) ×1 IMPLANT
TRAY FOLEY MTR SLVR 16FR STAT (SET/KITS/TRAYS/PACK) ×1 IMPLANT
TRAY FOLEY W/BAG SLVR 14FR LF (SET/KITS/TRAYS/PACK) IMPLANT
WATER STERILE IRR 1000ML POUR (IV SOLUTION) ×2 IMPLANT
WRAP KNEE MAXI GEL POST OP (GAUZE/BANDAGES/DRESSINGS) ×1 IMPLANT

## 2022-05-03 NOTE — H&P (Signed)
TOTAL KNEE ADMISSION H&P  Patient is being admitted for left total knee arthroplasty.  Subjective:  Chief Complaint:left knee pain.  HPI: Mandy Castillo, 81 y.o. female, has a history of pain and functional disability in the left knee due to arthritis and has failed non-surgical conservative treatments for greater than 12 weeks to includeNSAID's and/or analgesics, corticosteriod injections, viscosupplementation injections, and activity modification.  Onset of symptoms was gradual, starting 5 years ago with gradually worsening course since that time. The patient noted no past surgery on the left knee(s).  Patient currently rates pain in the left knee(s) at 9 out of 10 with activity. Patient has night pain, worsening of pain with activity and weight bearing, pain that interferes with activities of daily living, crepitus, and joint swelling.  Patient has evidence of subchondral sclerosis, periarticular osteophytes, joint subluxation, and joint space narrowing by imaging studies. This patient has had  failure of all reasonable conservative care . There is no active infection.  Patient Active Problem List   Diagnosis Date Noted   Atypical pigmented skin lesion 02/09/2022   Epigastric abdominal pain 06/04/2021   Urinary frequency 06/04/2021   Abdominal pressure 05/28/2021   Right sided abdominal pain 12/23/2020   Mild cognitive impairment with memory loss 09/01/2020   Bilateral chronic knee pain 07/24/2020   Heart burn 07/24/2020   Neck pain 07/09/2020   Atrial flutter (HCC) 04/21/2020   Essential hypertension 04/21/2020   Nuclear sclerosis, left 01/30/2020   Pseudophakia, right eye 01/30/2020   Past Medical History:  Diagnosis Date   Arthritis    GERD (gastroesophageal reflux disease)    Hypertension    Sebaceous cyst 07/24/2020    Past Surgical History:  Procedure Laterality Date   ABDOMINAL HYSTERECTOMY     APPENDECTOMY     BREAST SURGERY     cyst removed   CHOLECYSTECTOMY      CYST REMOVAL NECK      Current Facility-Administered Medications  Medication Dose Route Frequency Provider Last Rate Last Admin   bupivacaine liposome (EXPAREL) 1.3 % injection 266 mg  20 mL Other Once Jodi Geralds, MD       ceFAZolin (ANCEF) IVPB 2g/100 mL premix  2 g Intravenous On Call to OR Jodi Geralds, MD       lactated ringers infusion   Intravenous Continuous Lannie Fields, DO 10 mL/hr at 05/03/22 2458 Continued from Pre-op at 05/03/22 0659   tranexamic acid (CYKLOKAPRON) IVPB 1,000 mg  1,000 mg Intravenous To OR Jodi Geralds, MD       No Known Allergies  Social History   Tobacco Use   Smoking status: Never   Smokeless tobacco: Never  Substance Use Topics   Alcohol use: Yes    Comment: wine every few months    Family History  Problem Relation Age of Onset   Stroke Mother 71   Prostate cancer Father      Review of Systems ROS: I have reviewed the patient's review of systems thoroughly and there are no positive responses as relates to the HPI.  Objective:  Physical Exam  Vital signs in last 24 hours: Temp:  [98.3 F (36.8 C)] 98.3 F (36.8 C) (10/09 0553) Pulse Rate:  [69] 69 (10/09 0553) Resp:  [15] 15 (10/09 0553) BP: (141)/(71) 141/71 (10/09 0553) SpO2:  [98 %] 98 % (10/09 0553) Weight:  [77.5 kg] 77.5 kg (10/09 0532) Well-developed well-nourished patient in no acute distress. Alert and oriented x3 HEENT:within normal limits Cardiac: Regular rate and rhythm  Pulmonary: Lungs clear to auscultation Abdomen: Soft and nontender.  Normal active bowel sounds  Musculoskeletal: (Left knee: Painful range of motion.  Limited range of motion.  No instability.  Trace effusion.)  Labs: Recent Results (from the past 2160 hour(s))  Comprehensive metabolic panel     Status: None   Collection Time: 02/09/22 11:35 AM  Result Value Ref Range   Sodium 141 135 - 145 mEq/L   Potassium 4.2 3.5 - 5.1 mEq/L   Chloride 103 96 - 112 mEq/L   CO2 31 19 - 32 mEq/L    Glucose, Bld 79 70 - 99 mg/dL   BUN 12 6 - 23 mg/dL   Creatinine, Ser 1.61 0.40 - 1.20 mg/dL   Total Bilirubin 0.6 0.2 - 1.2 mg/dL   Alkaline Phosphatase 78 39 - 117 U/L   AST 20 0 - 37 U/L   ALT 16 0 - 35 U/L   Total Protein 6.8 6.0 - 8.3 g/dL   Albumin 4.4 3.5 - 5.2 g/dL   GFR 09.60 >45.40 mL/min    Comment: Calculated using the CKD-EPI Creatinine Equation (2021)   Calcium 9.6 8.4 - 10.5 mg/dL  CBC with Differential     Status: None   Collection Time: 02/09/22 11:35 AM  Result Value Ref Range   WBC 5.1 4.0 - 10.5 K/uL   RBC 4.45 3.87 - 5.11 Mil/uL   Hemoglobin 13.2 12.0 - 15.0 g/dL   HCT 98.1 19.1 - 47.8 %   MCV 88.9 78.0 - 100.0 fl   MCHC 33.4 30.0 - 36.0 g/dL   RDW 29.5 62.1 - 30.8 %   Platelets 293.0 150.0 - 400.0 K/uL   Neutrophils Relative % 57.4 43.0 - 77.0 %   Lymphocytes Relative 30.0 12.0 - 46.0 %   Monocytes Relative 9.0 3.0 - 12.0 %   Eosinophils Relative 3.0 0.0 - 5.0 %   Basophils Relative 0.6 0.0 - 3.0 %   Neutro Abs 2.9 1.4 - 7.7 K/uL   Lymphs Abs 1.5 0.7 - 4.0 K/uL   Monocytes Absolute 0.5 0.1 - 1.0 K/uL   Eosinophils Absolute 0.2 0.0 - 0.7 K/uL   Basophils Absolute 0.0 0.0 - 0.1 K/uL  Lipid panel     Status: Abnormal   Collection Time: 02/09/22 11:35 AM  Result Value Ref Range   Cholesterol 250 (H) 0 - 200 mg/dL    Comment: ATP III Classification       Desirable:  < 200 mg/dL               Borderline High:  200 - 239 mg/dL          High:  > = 657 mg/dL   Triglycerides 84.6 0.0 - 149.0 mg/dL    Comment: Normal:  <962 mg/dLBorderline High:  150 - 199 mg/dL   HDL 952.84 >13.24 mg/dL   VLDL 40.1 0.0 - 02.7 mg/dL   LDL Cholesterol 253 (H) 0 - 99 mg/dL   Total CHOL/HDL Ratio 2     Comment:                Men          Women1/2 Average Risk     3.4          3.3Average Risk          5.0          4.42X Average Risk          9.6  7.13X Average Risk          15.0          11.0                       NonHDL 112.25     Comment: NOTE:  Non-HDL goal should be  30 mg/dL higher than patient's LDL goal (i.e. LDL goal of < 70 mg/dL, would have non-HDL goal of < 100 mg/dL)  Basic metabolic panel per protocol     Status: Abnormal   Collection Time: 04/20/22 10:29 AM  Result Value Ref Range   Sodium 140 135 - 145 mmol/L   Potassium 4.2 3.5 - 5.1 mmol/L   Chloride 108 98 - 111 mmol/L   CO2 27 22 - 32 mmol/L   Glucose, Bld 106 (H) 70 - 99 mg/dL    Comment: Glucose reference range applies only to samples taken after fasting for at least 8 hours.   BUN 11 8 - 23 mg/dL   Creatinine, Ser 0.78 0.44 - 1.00 mg/dL   Calcium 9.6 8.9 - 10.3 mg/dL   GFR, Estimated >60 >60 mL/min    Comment: (NOTE) Calculated using the CKD-EPI Creatinine Equation (2021)    Anion gap 5 5 - 15    Comment: Performed at [facility], [place] [address] [place], [place] [address]  CBC per protocol     Status: None   Collection Time: 04/20/22 10:29 AM  Result Value Ref Range   WBC 4.6 4.0 - 10.5 K/uL   RBC 4.51 3.87 - 5.11 MIL/uL   Hemoglobin 13.2 12.0 - 15.0 g/dL   HCT 41.4 36.0 - 46.0 %   MCV 91.8 80.0 - 100.0 fL   MCH 29.3 26.0 - 34.0 pg   MCHC 31.9 30.0 - 36.0 g/dL   RDW 12.6 11.5 - 15.5 %   Platelets 302 150 - 400 K/uL   nRBC 0.0 0.0 - 0.2 %    Comment: Performed at [facility], [place] [address] [place], [place] [address]  Surgical pcr screen     Status: None   Collection Time: 04/20/22 11:39 AM   Specimen: Nasal Mucosa; Nasal Swab  Result Value Ref Range   MRSA, PCR NEGATIVE NEGATIVE   Staphylococcus aureus NEGATIVE NEGATIVE    Comment: (NOTE) The Xpert SA Assay (FDA approved for NASAL specimens in patients 53 years of age and older), is one component of a comprehensive surveillance program. It is not intended to diagnose infection nor to guide or monitor treatment. Performed at [facility], [place] [address] [place], [place] [address]      Estimated body mass index is 25.98 kg/m as calculated from the  following:   Height as of this encounter: 5\' 8"  (1.727 m).   Weight as of this encounter: 77.5 kg.   Imaging Review Plain radiographs demonstrate severe degenerative joint disease of the left knee(s). The overall alignment ismild valgus. The bone quality appears to be fair for age and reported activity level.      Assessment/Plan:  End stage arthritis, left knee   The patient history, physical examination, clinical judgment of the provider and imaging studies are consistent with end stage degenerative joint disease of the left knee(s) and total knee arthroplasty is deemed medically necessary. The treatment options including medical management, injection therapy arthroscopy and arthroplasty were discussed at length. The risks and benefits of total knee arthroplasty were presented and reviewed. The risks due  to aseptic loosening, infection, stiffness, patella tracking problems, thromboembolic complications and other imponderables were discussed. The patient acknowledged the explanation, agreed to proceed with the plan and consent was signed. Patient is being admitted for inpatient treatment for surgery, pain control, PT, OT, prophylactic antibiotics, VTE prophylaxis, progressive ambulation and ADL's and discharge planning. The patient is planning to be discharged home with home health services     Patient's anticipated LOS is less than 2 midnights, meeting these requirements: - Younger than 38 - Lives within 1 hour of care - Has a competent adult at home to recover with post-op recover - NO history of  - Chronic pain requiring opiods  - Diabetes  - Coronary Artery Disease  - Heart failure  - Heart attack  - Stroke  - DVT/VTE  - Cardiac arrhythmia  - Respiratory Failure/COPD  - Renal failure  - Anemia  - Advanced Liver disease

## 2022-05-03 NOTE — Discharge Instructions (Signed)

## 2022-05-03 NOTE — Op Note (Signed)
PATIENT ID:      Mandy Castillo  MRN:     [id number] DOB/AGE:    83-Aug-1942 / 83 y.o.       OPERATIVE REPORT   DATE OF PROCEDURE:  05/03/2022      PREOPERATIVE DIAGNOSIS:   OSTEOARTHRITIS LEFT KNEE      Estimated body mass index is 25.98 kg/m as calculated from the following:   Height as of this encounter: 5\' 8"  (1.727 m).   Weight as of this encounter: 77.5 kg.                                                       POSTOPERATIVE DIAGNOSIS:   Same                                                                  PROCEDURE:  Procedure(s): TOTAL KNEE ARTHROPLASTY Using DepuyAttune RP implants #6 Femur, #7Tibia, 6 mm Attune RP bearing, 38 Patella    SURGEON:  ASSISTANT:   Harvie Junior PA-C   (Present and scrubbed throughout the case, critical for assistance with exposure, retraction, instrumentation, and closure.)        ANESTHESIA: spinal, 20cc Exparel, 50cc 0.25% Marcaine EBL: 20 cc FLUID REPLACEMENT: unk cc crystaloid TOURNIQUET: DRAINS: None TRANEXAMIC ACID: 1gm IV, 2gm topical COMPLICATIONS:  None         INDICATIONS FOR PROCEDURE: The patient has  OSTEOARTHRITIS LEFT KNEE, mild valgus deformities, XR shows bone on bone arthritis, lateral subluxation of tibia. Patient has failed all conservative measures including anti-inflammatory medicines, narcotics, attempts at exercise and weight loss, cortisone injections and viscosupplementation.  Risks and benefits of surgery have been discussed, questions answered.   DESCRIPTION OF PROCEDURE: The patient identified by armband, received  IV antibiotics, in the holding area at [facility]. Patient taken to the operating room, appropriate anesthetic monitors were attached, and spinal anesthesia was  induced. IV Tranexamic acid was given.Tourniquet applied high to the operative thigh. Lateral post and foot positioner applied to the table, the lower extremity was then prepped and draped in usual sterile fashion from the toes to  the tourniquet. Time-out procedure was performed. [facility] PAC, was present and scrubbed throughout the case, critical for assistance with, positioning, exposure, retraction, instrumentation, and closure.The skin and subcutaneous tissue along the incision was injected with 20 cc of a mixture of Exparel and Marcaine solution, using a 20-gauge by 1-1/2 inch needle. We began the operation, with the knee flexed 130 degrees, by making the anterior midline incision starting at handbreadth above the patella going over the patella 1 cm medial to and 4 cm distal to the tibial tubercle. Small bleeders in the skin and the subcutaneous tissue identified and cauterized. Transverse retinaculum was incised and reflected medially and a medial parapatellar arthrotomy was accomplished. the patella was everted and theprepatellar fat pad resected. The superficial medial collateral ligament was then elevated from anterior to posterior along the proximal flare of the tibia and anterior half of the menisci resected. The knee was hyperflexed exposing bone on bone arthritis. Peripheral and notch osteophytes  as well as the cruciate ligaments were then resected. We continued to work our way around posteriorly along the proximal tibia, and externally rotated the tibia subluxing it out from underneath the femur. A McHale PCL retractor was placed through the notch and a lateral Hohmann retractor placed, and we then entered the proximal tibia in line with the Depuy starter drill in line with the axis of the tibia followed by an intramedullary guide rod and 0-degree posterior slope cutting guide. The tibial cutting guide, 4 degree posterior sloped, was pinned into place allowing resection of 2 mm of bone medially and 10 mm of bone laterally. Satisfied with the tibial resection, we then entered the distal femur 2 mm anterior to the PCL origin with the intramedullary guide rod and applied the distal femoral cutting guide set at 9 mm, with 5  degrees of valgus. This was pinned along the epicondylar axis. At this point, the distal femoral cut was accomplished without difficulty. We then sized for a #6 femoral component and pinned the guide in 0 degrees of external rotation. The chamfer cutting guide was pinned into place. The anterior, posterior, and chamfer cuts were accomplished without difficulty followed by the Attune RP box cutting guide and the box cut. We also removed posterior osteophytes from the posterior femoral condyles. The posterior capsule was injected with Exparel solution. The knee was brought into full extension. We checked our extension gap and fit a 6 mm bearing. Distracting in extension with a lamina spreader,  bleeders in the posterior capsule, Posterior medial and posterior lateral gutter were cauterized.  The transexamic acid-soaked sponge was then placed in the gap of the knee in extension. The knee was flexed 30. The posterior patella cut was accomplished with the 9.5 mm Attune cutting guide, sized for a 110mm dome, and the fixation pegs drilled.The knee was then once again hyperflexed exposing the proximal tibia. We sized for a # 7 tibial base plate, applied the smokestack and the conical reamer followed by the the Delta fin keel punch. We then hammered into place the Attune RP trial femoral component, drilled the lugs, inserted a  6 mm trial bearing, trial patellar button, and took the knee through range of motion from 0-130 degrees. Medial and lateral ligamentous stability was checked. No thumb pressure was required for patellar Tracking. The tourniquet was released @48  min. All trial components were removed, mating surfaces irrigated with pulse lavage, and dried with suction and sponges. 10 cc of the Exparel solution was applied to the cancellus bone of the patella distal femur and proximal tibia.  After waiting 30 seconds, the bony surfaces were again, dried with sponges. A double batch of DePuy HV cement was mixed and  applied to all bony metallic mating surfaces except for the posterior condyles of the femur itself. In order, we hammered into place the tibial tray and removed excess cement, the femoral component and removed excess cement. The final Attune RP bearing was inserted, and the knee brought to full extension with compression. The patellar button was clamped into place, and excess cement removed. The knee was held at 30 flexion with compression, while the cement cured. The wound was irrigated out with normal saline solution pulse lavage. The rest of the Exparel was injected into the parapatellar arthrotomy, subcutaneous tissues, and periosteal tissues. The parapatellar arthrotomy was closed with running #1 Vicryl suture. The subcutaneous tissue with 3-0 undyed Vicryl suture, and the skin with running 3-0 SQ vicryl. An Aquacil and Ace wrap  were applied. The patient was taken to recovery room without difficulty.   Alta Corning 05/03/2022, 4:31 PM

## 2022-05-03 NOTE — Care Management CC44 (Signed)
Condition Code 44 Documentation Completed  Patient Details  Name: Mandy Castillo MRN: [id number] Date of Birth: October 27, 1940   Condition Code 44 given:  Yes Patient signature on Condition Code 44 notice:  Yes Documentation of 2 MD's agreement:  Yes Code 44 added to claim:  Yes    Ross Ludwig, LCSW 05/03/2022, 4:43 PM

## 2022-05-03 NOTE — Anesthesia Postprocedure Evaluation (Signed)
Anesthesia Post Note  Patient: Mandy Castillo  Procedure(s) Performed: TOTAL KNEE ARTHROPLASTY (Left: Knee)     Patient location during evaluation: PACU Anesthesia Type: Spinal Level of consciousness: awake and alert Pain management: pain level controlled Vital Signs Assessment: post-procedure vital signs reviewed and stable Respiratory status: spontaneous breathing, nonlabored ventilation and respiratory function stable Cardiovascular status: blood pressure returned to baseline Postop Assessment: no apparent nausea or vomiting, spinal receding, no headache and no backache Anesthetic complications: no   No notable events documented.  Last Vitals:  Vitals:   05/03/22 1115 05/03/22 1136  BP: 129/68 137/86  Pulse: (!) 58 69  Resp: 18 14  Temp:    SpO2: 100% 100%    Last Pain:  Vitals:   05/03/22 1100  TempSrc:   PainSc: [name]

## 2022-05-03 NOTE — Anesthesia Procedure Notes (Signed)
Anesthesia Regional Block: Adductor canal block   Pre-Anesthetic Checklist: , timeout performed,  Correct Patient, Correct Site, Correct Laterality,  Correct Procedure, Correct Position, site marked,  Risks and benefits discussed,  Pre-op evaluation,  At surgeon's request and post-op pain management  Laterality: Left  Prep: Maximum Sterile Barrier Precautions used, chloraprep       Needles:  Injection technique: Single-shot  Needle Type: Echogenic Stimulator Needle     Needle Length: 9cm  Needle Gauge: 22     Additional Needles:   Procedures:,,,, ultrasound used (permanent image in chart),,    Narrative:  Start time: 05/03/2022 7:09 AM End time: 05/03/2022 7:12 AM Injection made incrementally with aspirations every 5 mL.  Performed by: Personally  Anesthesiologist: Brennan Bailey, MD  Additional Notes: Risks, benefits, and alternative discussed. Patient gave consent for procedure. Patient prepped and draped in sterile fashion. Sedation administered, patient remains easily responsive to voice. Relevant anatomy identified with ultrasound guidance. Local anesthetic given in 5cc increments with no signs or symptoms of intravascular injection. No pain or paraesthesias with injection. Patient monitored throughout procedure with signs of LAST or immediate complications. Tolerated well. Ultrasound image placed in chart.  Tawny Asal, MD

## 2022-05-03 NOTE — Transfer of Care (Signed)
Immediate Anesthesia Transfer of Care Note  Patient: Mandy Castillo  Procedure(s) Performed: Procedure(s): TOTAL KNEE ARTHROPLASTY (Left)  Patient Location: PACU  Anesthesia Type:MAC, Regional and Spinal  Level of Consciousness: Patient easily awoken, sedated, comfortable, cooperative, following commands, responds to stimulation.   Airway & Oxygen Therapy: Patient spontaneously breathing, ventilating well, oxygen via simple oxygen mask.  Post-op Assessment: Report given to PACU RN, vital signs reviewed and stable.   Post vital signs: Reviewed and stable.  Complications: No apparent anesthesia complications  Last Vitals:  Vitals Value Taken Time  BP 132/66 05/03/22 0948  Temp    Pulse 66 05/03/22 0949  Resp 17 05/03/22 0949  SpO2 100 % 05/03/22 0949  Vitals shown include unvalidated device data.  Last Pain:  Vitals:   05/03/22 0619  TempSrc:   PainSc: 7       Patients Stated Pain Goal: 6 (51/89/84 2103)  Complications: No notable events documented.

## 2022-05-03 NOTE — Anesthesia Procedure Notes (Signed)
Spinal  Patient location during procedure: OR Start time: 05/03/2022 7:26 AM End time: 05/03/2022 7:29 AM Reason for block: surgical anesthesia Staffing Performed: anesthesiologist  Anesthesiologist: Brennan Bailey, MD Performed by: Brennan Bailey, MD Authorized by: Brennan Bailey, MD   Preanesthetic Checklist Completed: patient identified, IV checked, risks and benefits discussed, surgical consent, monitors and equipment checked, pre-op evaluation and timeout performed Spinal Block Patient position: sitting Prep: DuraPrep and site prepped and draped Patient monitoring: continuous pulse ox, blood pressure and heart rate Approach: midline Location: L3-4 Injection technique: single-shot Needle Needle type: Pencan  Needle gauge: 24 G Needle length: 9 cm Assessment Events: CSF return Additional Notes Risks, benefits, and alternative discussed. Patient gave consent to procedure. Prepped and draped in sitting position. Patient sedated but responsive to voice. Clear CSF obtained after one needle pass. Positive terminal aspiration. No pain or paraesthesias with injection. Patient tolerated procedure well. Vital signs stable. Tawny Asal, MD

## 2022-05-03 NOTE — Care Management Obs Status (Signed)
[place] NOTIFICATION   Patient Details  Name: Mandy Castillo MRN: [id number] Date of Birth: 11/21/1940   Medicare Observation Status Notification Given:  Yes    Cecil Cobbs 05/03/2022, 4:43 PM

## 2022-05-03 NOTE — Anesthesia Procedure Notes (Signed)
Procedure Name: MAC Date/Time: 05/03/2022 7:28 AM  Performed by: Deliah Boston, CRNAPre-anesthesia Checklist: Patient identified, Emergency Drugs available, Suction available and Patient being monitored Patient Re-evaluated:Patient Re-evaluated prior to induction Oxygen Delivery Method: Simple face mask Preoxygenation: Pre-oxygenation with 100% oxygen Placement Confirmation: positive ETCO2 and breath sounds checked- equal and bilateral

## 2022-05-03 NOTE — TOC Transition Note (Signed)
Transition of Care [facility]) - CM/SW Discharge Note   Patient Details  Name: Mandy Castillo MRN: [id number] Date of Birth: [birth year]  Transition of Care [facility] CM/SW Contact:  Ross Ludwig, LCSW Phone Number: 05/03/2022, 5:52 PM   Clinical Narrative:     Patient was prearranged with [place] for [facility] PT.  Patient has equipment, plan to return home with home health.  CSW signing off, please reconsult if other social work needs arise.   Final next level of care: [place] Barriers to Discharge: Continued Medical Work up   Patient Goals and CMS Choice Patient states their goals for this hospitalization and ongoing recovery are:: To return home with home health CMS Medicare.gov Compare Post Acute Care list provided to:: Patient Choice offered to / list presented to : Patient  Discharge Placement                       Discharge Plan and Services                DME Arranged: [place] rolling DME Agency: [employer]       HH Arranged: PT [place] Agency: [facility]        Social Determinants of Health (SDOH) Interventions     Readmission Risk Interventions     No data to display

## 2022-05-03 NOTE — Evaluation (Signed)
Physical Therapy Evaluation Patient Details Name: Mandy Castillo MRN: [id number] DOB: Jan 13, 1941 Today's Date: 05/03/2022  History of Present Illness  Pt is an 81 year old female s/p L TKA on 05/03/22.  PMHx significant for HTN, memory impairment  Clinical Impression  Pt is s/p TKA resulting in the deficits listed below (see PT Problem List).  Pt will benefit from skilled PT to increase their independence and safety with mobility to allow discharge to the venue listed below.  Pt seen in PACU.  Pt assisted with standing x3 and requiring mod assist to rise and stabilize.  Pt unable to march in place or ambulate at this time.  Pt reports she lives with her significant other, and he just had hip surgery so he would not be able to physically assist her upon d/c.        Recommendations for follow up therapy are one component of a multi-disciplinary discharge planning process, led by the attending physician.  Recommendations may be updated based on patient status, additional functional criteria and insurance authorization.  Follow Up Recommendations Follow physician's recommendations for discharge plan and follow up therapies      Assistance Recommended at Discharge Frequent or constant Supervision/Assistance  Patient can return home with the following  A little help with walking and/or transfers;A little help with bathing/dressing/bathroom;Help with stairs or ramp for entrance    Equipment Recommendations Rolling walker (2 wheels)  Recommendations for Other Services       Functional Status Assessment Patient has had a recent decline in their functional status and demonstrates the ability to make significant improvements in function in a reasonable and predictable amount of time.     Precautions / Restrictions Precautions Precautions: Fall;Knee Restrictions Weight Bearing Restrictions: No      Mobility  Bed Mobility Overal bed mobility: Needs Assistance Bed Mobility: Supine to Sit, Sit  to Supine     Supine to sit: Min guard Sit to supine: Min guard   General bed mobility comments: min/guard for safety    Transfers Overall transfer level: Needs assistance Equipment used: Rolling walker (2 wheels) Transfers: Sit to/from Stand Sit to Stand: Mod assist, From elevated surface           General transfer comment: assist to rise and steady; pt reports being unable to feel floor under left foot, unable to march in place; performed sit to stand x3    Ambulation/Gait                  Stairs            Wheelchair Mobility    Modified Rankin (Stroke Patients Only)       Balance                                             Pertinent Vitals/Pain Pain Assessment Pain Assessment: 0-10 Pain Score: 8  Pain Location: left knee Pain Descriptors / Indicators: Sore Pain Intervention(s): Repositioned, Monitored during session, Premedicated before session    Home Living Family/patient expects to be discharged to:: Private residence Living Arrangements: Spouse/significant other   Type of Home: House Home Access: Level entry       Home Layout: One level Home Equipment: None      Prior Function Prior Level of Function : Independent/Modified Independent  Hand Dominance        Extremity/Trunk Assessment        Lower Extremity Assessment Lower Extremity Assessment: LLE deficits/detail LLE Deficits / Details: able to perform SLR, left knee flexion AAROM 95* sitting; pt reports sensation of LE present EXCEPT plantar foot surface       Communication   Communication: No difficulties  Cognition Arousal/Alertness: Awake/alert Behavior During Therapy: WFL for tasks assessed/performed Overall Cognitive Status: No family/caregiver present to determine baseline cognitive functioning                                 General Comments: per chart review and RN, memory impairments reported,  pt's significant other not present, pt appropriate and orientated        General Comments      Exercises     Assessment/Plan    PT Assessment Patient needs continued PT services  PT Problem List Decreased strength;Decreased range of motion;Decreased mobility;Decreased knowledge of use of DME;Decreased knowledge of precautions;Pain       PT Treatment Interventions Stair training;Gait training;DME instruction;Balance training;Therapeutic exercise;Therapeutic activities;Patient/family education;Functional mobility training    PT Goals (Current goals can be found in the Care Plan section)  Acute Rehab PT Goals PT Goal Formulation: With patient Time For Goal Achievement: 05/10/22 Potential to Achieve Goals: Good    Frequency 7X/week     Co-evaluation               AM-PAC PT "6 Clicks" Mobility  Outcome Measure Help needed turning from your back to your side while in a flat bed without using bedrails?: A Little Help needed moving from lying on your back to sitting on the side of a flat bed without using bedrails?: A Little Help needed moving to and from a bed to a chair (including a wheelchair)?: A Lot Help needed standing up from a chair using your arms (e.g., wheelchair or bedside chair)?: A Lot Help needed to walk in hospital room?: A Lot Help needed climbing 3-5 steps with a railing? : Total 6 Click Score: 13    End of Session Equipment Utilized During Treatment: Gait belt Activity Tolerance: Patient tolerated treatment well Patient left: in bed;with call bell/phone within reach Nurse Communication: Mobility status PT Visit Diagnosis: Difficulty in walking, not elsewhere classified (R26.2);Pain Pain - Right/Left: Left Pain - part of body: Knee    Time: 1314-1330 PT Time Calculation (min) (ACUTE ONLY): 16 min   Charges:   PT Evaluation $PT Eval Low Complexity: 1 Low        Kati PT, DPT Physical Therapist Acute Rehabilitation Services Preferred contact  method: Secure Chat Weekend Pager Only: [phone] Office: [place] 05/03/2022, 1:48 PM

## 2022-05-04 ENCOUNTER — Encounter (HOSPITAL_COMMUNITY): Payer: Self-pay | Admitting: Orthopedic Surgery

## 2022-05-04 DIAGNOSIS — R7309 Other abnormal glucose: Secondary | ICD-10-CM | POA: Diagnosis not present

## 2022-05-04 DIAGNOSIS — I10 Essential (primary) hypertension: Secondary | ICD-10-CM | POA: Diagnosis not present

## 2022-05-04 DIAGNOSIS — M1712 Unilateral primary osteoarthritis, left knee: Secondary | ICD-10-CM | POA: Diagnosis not present

## 2022-05-04 DIAGNOSIS — I4892 Unspecified atrial flutter: Secondary | ICD-10-CM | POA: Diagnosis not present

## 2022-05-04 LAB — GLUCOSE, CAPILLARY: Glucose-Capillary: 131 mg/dL — ABNORMAL HIGH (ref 70–99)

## 2022-05-04 NOTE — Progress Notes (Signed)
Physical Therapy Treatment Patient Details Name: Mandy Castillo MRN: [id number] DOB: 1940/12/18 Today's Date: 05/04/2022   History of Present Illness Pt is an 81 year old female s/p L TKA on 05/03/22.  PMHx significant for HTN, memory impairment    PT Comments    Pt ambulated in hallway and performed LE exercises.  Observed impaired memory during session.  Requested pt have her partner present for next session as he will be assisting her home.    Recommendations for follow up therapy are one component of a multi-disciplinary discharge planning process, led by the attending physician.  Recommendations may be updated based on patient status, additional functional criteria and insurance authorization.  Follow Up Recommendations  Follow physician's recommendations for discharge plan and follow up therapies     Assistance Recommended at Discharge Frequent or constant Supervision/Assistance  Patient can return home with the following A little help with walking and/or transfers;A little help with bathing/dressing/bathroom;Help with stairs or ramp for entrance   Equipment Recommendations  Rolling walker (2 wheels)    Recommendations for Other Services       Precautions / Restrictions Precautions Precautions: Fall;Knee Restrictions Weight Bearing Restrictions: No     Mobility  Bed Mobility               General bed mobility comments: pt in recliner    Transfers Overall transfer level: Needs assistance Equipment used: Rolling walker (2 wheels) Transfers: Sit to/from Stand Sit to Stand: Min guard           General transfer comment: verbal cues for UE and LE positioning, making sure RW ready prior to standing (pt attempting to stand without RW nearby)    Ambulation/Gait Ambulation/Gait assistance: Min guard Gait Distance (Feet): 120 Feet Assistive device: Rolling walker (2 wheels) Gait Pattern/deviations: Step-through pattern, Decreased stride length, Antalgic        General Gait Details: verbal cues for sequence, RW positioning, allowing knee flexion   Stairs             Wheelchair Mobility    Modified Rankin (Stroke Patients Only)       Balance                                            Cognition Arousal/Alertness: Awake/alert Behavior During Therapy: WFL for tasks assessed/performed Overall Cognitive Status: No family/caregiver present to determine baseline cognitive functioning                                 General Comments: alert and orientated however observed memory impairment        Exercises Total Joint Exercises Ankle Circles/Pumps: AROM, Both, 10 reps Quad Sets: AROM, Both, 10 reps Heel Slides: AAROM, Left, 10 reps Hip ABduction/ADduction: AROM, Left, 10 reps Straight Leg Raises: AROM, Left, 10 reps Knee Flexion: AROM, Seated, Left, 10 reps    General Comments        Pertinent Vitals/Pain Pain Assessment Pain Assessment: 0-10 Pain Score: 2  Pain Location: left knee Pain Descriptors / Indicators: Sore Pain Intervention(s): Repositioned, Monitored during session    Home Living                          Prior Function  PT Goals (current goals can now be found in the care plan section) Progress towards PT goals: Progressing toward goals    Frequency    7X/week      PT Plan Current plan remains appropriate    Co-evaluation              AM-PAC PT "6 Clicks" Mobility   Outcome Measure  Help needed turning from your back to your side while in a flat bed without using bedrails?: A Little Help needed moving from lying on your back to sitting on the side of a flat bed without using bedrails?: A Little Help needed moving to and from a bed to a chair (including a wheelchair)?: A Little Help needed standing up from a chair using your arms (e.g., wheelchair or bedside chair)?: A Little Help needed to walk in hospital room?: A Little Help  needed climbing 3-5 steps with a railing? : A Lot 6 Click Score: 17    End of Session Equipment Utilized During Treatment: Gait belt Activity Tolerance: Patient tolerated treatment well Patient left: with call bell/phone within reach;in chair;with chair alarm set   PT Visit Diagnosis: Difficulty in walking, not elsewhere classified (R26.2)     Time: 0947-1000 PT Time Calculation (min) (ACUTE ONLY): 13 min  Charges:  $Therapeutic Exercise: 8-22 mins                    Thomasene Mohair PT, DPT Physical Therapist Acute Rehabilitation Services Preferred contact method: Secure Chat Weekend Pager Only: [phone] Office: [phone]    Janan Halter [place] 05/04/2022, 11:37 AM

## 2022-05-04 NOTE — TOC Progression Note (Signed)
Transition of Care [facility]) - Progression Note    Patient Details  Name: Mandy Castillo MRN: [id number] Date of Birth: 16-Apr-1941  Transition of Care [facility]) CM/SW Contact  Lennart Pall, LCSW Phone Number: 05/04/2022, 9:41 AM  Clinical Narrative:     Alerted that pt is in need for RW - order placed with [employer] and delivery to room.  Plan dc today.    Barriers to Discharge: Continued Medical Work up  Expected Discharge Plan and Services           Expected Discharge Date: 05/04/22               DME Arranged: [facility] rolling DME Agency: [employer]       HH Arranged: PT [place] Agency: [place]         Social Determinants of Health (SDOH) Interventions    Readmission Risk Interventions     No data to display

## 2022-05-04 NOTE — Progress Notes (Signed)
Physical Therapy Treatment Patient Details Name: Mandy Castillo MRN: [id number] DOB: 10-21-1940 Today's Date: 05/04/2022   History of Present Illness Pt is an 81 year old female s/p L TKA on 05/03/22.  PMHx significant for HTN, memory impairment    PT Comments    Pt ambulated in hallway and became nauseated, sweaty and dizzy.  Pt with syncopal like episode and required increased assist to land safety on bed.  Staff called into room and obtained vitals.  Pt repositioned in bed.  Pt's significant other present and observed session.  Pt does not appear safe to d/c home today.     Recommendations for follow up therapy are one component of a multi-disciplinary discharge planning process, led by the attending physician.  Recommendations may be updated based on patient status, additional functional criteria and insurance authorization.  Follow Up Recommendations  Follow physician's recommendations for discharge plan and follow up therapies     Assistance Recommended at Discharge Frequent or constant Supervision/Assistance  Patient can return home with the following A little help with walking and/or transfers;A little help with bathing/dressing/bathroom;Help with stairs or ramp for entrance   Equipment Recommendations  Rolling walker (2 wheels)    Recommendations for Other Services       Precautions / Restrictions Precautions Precautions: Fall;Knee Restrictions Weight Bearing Restrictions: No     Mobility  Bed Mobility Overal bed mobility: Needs Assistance         Sit to supine: Total assist   General bed mobility comments: increased assist due to syncopal episode    Transfers Overall transfer level: Needs assistance Equipment used: Rolling walker (2 wheels) Transfers: Sit to/from Stand Sit to Stand: Min guard           General transfer comment: verbal cues for UE and LE positioning; increased assist to safely land on bed with pt during syncopal episode     Ambulation/Gait Ambulation/Gait assistance: Min assist Gait Distance (Feet): 50 Feet Assistive device: Rolling walker (2 wheels) Gait Pattern/deviations: Step-through pattern, Decreased stride length, Antalgic       General Gait Details: verbal cues for sequence, RW positioning, allowing knee flexion; pt with nausea so started back to room and pt perspiring and reports yes to dizziness; pt just about near bed and required increased assist to safely land on bed surface with upper body while therapist then lifted LEs, pressed duress button, and held until staff entered and assisted with repositioning pt and obtaining vitals (syncopal type episode and pt safely assisted to bed)   Stairs             Wheelchair Mobility    Modified Rankin (Stroke Patients Only)       Balance                                            Cognition Arousal/Alertness: Awake/alert Behavior During Therapy: WFL for tasks assessed/performed Overall Cognitive Status: No family/caregiver present to determine baseline cognitive functioning                                 General Comments: alert and orientated however observed memory impairment        Exercises     General Comments        Pertinent Vitals/Pain Pain Assessment Pain Assessment: 0-10 Pain Score: 2  Pain Location: left knee Pain Descriptors / Indicators: Sore Pain Intervention(s): Monitored during session, Repositioned    Home Living                          Prior Function            PT Goals (current goals can now be found in the care plan section) Progress towards PT goals: Progressing toward goals    Frequency    7X/week      PT Plan Current plan remains appropriate    Co-evaluation              AM-PAC PT "6 Clicks" Mobility   Outcome Measure  Help needed turning from your back to your side while in a flat bed without using bedrails?: A Little Help needed  moving from lying on your back to sitting on the side of a flat bed without using bedrails?: A Little Help needed moving to and from a bed to a chair (including a wheelchair)?: A Little Help needed standing up from a chair using your arms (e.g., wheelchair or bedside chair)?: A Little Help needed to walk in hospital room?: A Little Help needed climbing 3-5 steps with a railing? : A Lot 6 Click Score: 17    End of Session Equipment Utilized During Treatment: Gait belt Activity Tolerance: Patient tolerated treatment well Patient left: with call bell/phone within reach;in bed;with bed alarm set;with nursing/sitter in room   PT Visit Diagnosis: Difficulty in walking, not elsewhere classified (R26.2) Pain - Right/Left: Left Pain - part of body: Knee     Time: 9470-9628 PT Time Calculation (min) (ACUTE ONLY): 15 min  Charges:  $Gait Training: 8-22 mins                     Arlyce Dice, DPT Physical Therapist Acute Rehabilitation Services Preferred contact method: Secure Chat Weekend Pager Only: [phone] Office: [place] 05/04/2022, 1:22 PM

## 2022-05-04 NOTE — Discharge Summary (Addendum)
Patient ID: Mandy Castillo MRN: [id number] DOB/AGE: 1940-10-13 81 y.o.  Admit date: 05/03/2022 Discharge date: 05/05/2022  Admission Diagnoses:  Principal Problem:   Primary osteoarthritis of left knee Active Problems:   S/P total knee arthroplasty, left   Discharge Diagnoses:  Same  Past Medical History:  Diagnosis Date   Arthritis    GERD (gastroesophageal reflux disease)    Hypertension    Sebaceous cyst 07/24/2020    Surgeries: Procedure(s): Left TOTAL KNEE ARTHROPLASTY on 05/03/2022    Discharged Condition: Improved  Hospital Course: Mandy Castillo is an 81 y.o. female who was admitted 05/03/2022 for operative treatment ofPrimary osteoarthritis of left knee. Patient has severe unremitting pain that affects sleep, daily activities, and work/hobbies. After pre-op clearance the patient was taken to the operating room on 05/03/2022 and underwent  Procedure(s): Left TOTAL KNEE ARTHROPLASTY.    Patient was given perioperative antibiotics:  Anti-infectives (From admission, onward)    Start     Dose/Rate Route Frequency Ordered Stop   05/03/22 1330  ceFAZolin (ANCEF) IVPB 2g/100 mL premix        2 g 200 mL/hr over 30 Minutes Intravenous Every 6 hours 05/03/22 1127 05/03/22 1752   05/03/22 0600  ceFAZolin (ANCEF) IVPB 2g/100 mL premix        2 g 200 mL/hr over 30 Minutes Intravenous On call to O.R. 05/03/22 0531 05/03/22 0730        Patient was given sequential compression devices, early ambulation, and chemoprophylaxis to prevent DVT.  Patient benefited maximally from hospital stay and there were no complications.  The patient made slow progress with physical therapy on postop day 1 and had a vagal episode where she nearly passed out while walking with therapy.  Due to this she was kept an additional night for safety reasons.  On the date of discharge the patient ambulated with physical therapy in the hall and did great.  Recent vital signs: Patient Vitals for the past 24  hrs:  BP Temp Temp src Pulse Resp SpO2  05/04/22 0537 (!) 156/69 98.4 F (36.9 C) -- 78 12 100 %  05/04/22 0140 (!) 165/72 98.2 F (36.8 C) -- 61 14 93 %  05/03/22 2109 (!) 154/59 98 F (36.7 C) Oral 67 16 99 %  05/03/22 1627 (!) 150/59 (!) 97.4 F (36.3 C) -- (!) 58 17 100 %  05/03/22 1500 -- -- -- 62 -- 98 %  05/03/22 1400 (!) 148/63 -- -- 64 18 99 %  05/03/22 1300 (!) 158/76 -- -- (!) 59 18 100 %  05/03/22 1212 135/89 -- -- 64 -- 99 %  05/03/22 1145 129/69 -- -- 70 -- 98 %  05/03/22 1136 137/86 -- -- 69 14 100 %  05/03/22 1115 129/68 -- -- (!) 58 18 100 %  05/03/22 1100 (!) 150/63 -- -- (!) 56 15 98 %  05/03/22 1045 (!) 145/126 -- -- 63 18 100 %  05/03/22 1030 (!) 139/91 -- -- (!) 57 11 97 %  05/03/22 1015 (!) 142/72 -- -- (!) 56 14 99 %  05/03/22 1000 131/73 -- -- 64 15 97 %  05/03/22 0948 132/66 (!) 97.4 F (36.3 C) -- 66 17 100 %     Recent laboratory studies: No results for input(s): "WBC", "HGB", "HCT", "PLT", "NA", "K", "CL", "CO2", "BUN", "CREATININE", "GLUCOSE", "INR", "CALCIUM" in the last 72 hours.  Invalid input(s): "PT", "2"   Discharge Medications:   Allergies as of 05/04/2022   No Known Allergies  Medication List     TAKE these medications    aspirin EC 325 MG tablet Take 1 tablet (325 mg total) by mouth 2 (two) times daily after a meal. Take x 1 month post op to decrease risk of blood clots. What changed:  medication strength how much to take when to take this additional instructions   B-12 PO Take 1 tablet by mouth daily.   D3 PO Take 1 tablet by mouth daily.   docusate sodium 100 MG capsule Commonly known as: Colace Take 1 capsule (100 mg total) by mouth 2 (two) times daily.   donepezil 10 MG tablet Commonly known as: ARICEPT Take 10 mg by mouth at bedtime.   HYDROcodone-acetaminophen 5-325 MG tablet Commonly known as: Norco Take 1-2 tablets by mouth every 6 (six) hours as needed for moderate pain.   metoprolol succinate 50  MG 24 hr tablet Commonly known as: TOPROL-XL Take 1 tablet (50 mg total) by mouth daily.   omeprazole 20 MG capsule Commonly known as: PRILOSEC TAKE 1 CAPSULE BY MOUTH EVERY DAY   tiZANidine 2 MG tablet Commonly known as: ZANAFLEX Take 1 tablet (2 mg total) by mouth every 8 (eight) hours as needed for muscle spasms.               Durable Medical Equipment  (From admission, onward)           Start     Ordered   05/03/22 1623  DME Walker rolling  Once       Question:  Patient needs a walker to treat with the following condition  Answer:  Primary osteoarthritis of left knee   05/03/22 1622              Discharge Care Instructions  (From admission, onward)           Start     Ordered   05/04/22 0000  Weight bearing as tolerated       Question Answer Comment  Laterality left   Extremity Lower      05/04/22 0821            Diagnostic Studies: DG Chest 2 View  Result Date: 04/21/2022 CLINICAL DATA:  Preoperative LEFT knee surgery EXAM: CHEST - 2 VIEW COMPARISON:  Chest x-ray dated 02/07/2021 FINDINGS: Heart size and mediastinal contours are within normal limits. Lungs are clear. No pleural effusion is seen. Mild degenerative spondylosis of the upper thoracic spine. Wedge compression deformity of a vertebral body at the thoracolumbar junction, of uncertain age, mild to moderate in degree IMPRESSION: 1. No active cardiopulmonary disease. 2. Wedge compression deformity of a vertebral body at the thoracolumbar junction, with approximately 25% compression of the superior endplate, mild to moderate in degree and perhaps slightly advanced compared to a plain film of the lumbar spine dated 08/19/2021. Electronically Signed   By: Bary Richard M.D.   On: 04/21/2022 07:57    Disposition: Discharge disposition: 01-Home or Self Care       Discharge Instructions     Call MD / Call 911   Complete by: As directed    If you experience chest pain or shortness of  breath, CALL 911 and be transported to the hospital emergency room.  If you develope a fever above 101 F, pus (white drainage) or increased drainage or redness at the wound, or calf pain, call your surgeon's office.   Constipation Prevention   Complete by: As directed    Drink plenty of fluids.  Prune juice may be helpful.  You may use a stool softener, such as Colace (over the counter) 100 mg twice a day.  Use MiraLax (over the counter) for constipation as needed.   Diet general   Complete by: As directed    Do not put a pillow under the knee. Place it under the heel.   Complete by: As directed    Increase activity slowly as tolerated   Complete by: As directed    Post-operative opioid taper instructions:   Complete by: As directed    POST-OPERATIVE OPIOID TAPER INSTRUCTIONS: It is important to wean off of your opioid medication as soon as possible. If you do not need pain medication after your surgery it is ok to stop day one. Opioids include: Codeine, Hydrocodone(Norco, Vicodin), Oxycodone(Percocet, oxycontin) and hydromorphone amongst others.  Long term and even short term use of opiods can cause: Increased pain response Dependence Constipation Depression Respiratory depression And more.  Withdrawal symptoms can include Flu like symptoms Nausea, vomiting And more Techniques to manage these symptoms Hydrate well Eat regular healthy meals Stay active Use relaxation techniques(deep breathing, meditating, yoga) Do Not substitute Alcohol to help with tapering If you have been on opioids for less than two weeks and do not have pain than it is ok to stop all together.  Plan to wean off of opioids This plan should start within one week post op of your joint replacement. Maintain the same interval or time between taking each dose and first decrease the dose.  Cut the total daily intake of opioids by one tablet each day Next start to increase the time between doses. The last dose  that should be eliminated is the evening dose.      Weight bearing as tolerated   Complete by: As directed    Laterality: left   Extremity: Lower        Follow-up Information     Dorna Leitz, MD. Go on 05/18/2022.   Specialty: Orthopedic Surgery Why: Your appointment is scheduled for 9:30 Contact information: [place] [address] [phone]         [facility] [place] Follow up.   Specialty: [place] Why: HHPT will provide 6 visits at home prior to starting outpatient physical therapy Contact information: [place] [address] [phone]         [facility], [place]. Go on 05/18/2022.   Why: Your outpatient physical therapy will start after your follow up appointment with MD Contact information: Physical Therapy [address] [place] [address] [phone]                  Signed: Erlene Senters 05/04/2022, 8:21 AM

## 2022-05-04 NOTE — Progress Notes (Signed)
Subjective: 1 Day Post-Op Procedure(s) (LRB): TOTAL KNEE ARTHROPLASTY (Left) Patient reports pain as mild.  Taking by mouth and voiding okay.  No chest pain or shortness of breath.  Objective: Vital signs in last 24 hours: Temp:  [97.4 F (36.3 C)-98.4 F (36.9 C)] 98.4 F (36.9 C) (10/10 0537) Pulse Rate:  [56-78] 78 (10/10 0537) Resp:  [11-18] 12 (10/10 0537) BP: (129-165)/(59-126) 156/69 (10/10 0537) SpO2:  [93 %-100 %] 100 % (10/10 0537)  Intake/Output from previous day: 10/09 0701 - 10/10 0700 In: 2440 [P.O.:240; I.V.:2100; IV Piggyback:100] Out: 325 [Urine:300; Blood:25] Intake/Output this shift: No intake/output data recorded.  No results for input(s): "HGB" in the last 72 hours. No results for input(s): "WBC", "RBC", "HCT", "PLT" in the last 72 hours. No results for input(s): "NA", "K", "CL", "CO2", "BUN", "CREATININE", "GLUCOSE", "CALCIUM" in the last 72 hours. No results for input(s): "LABPT", "INR" in the last 72 hours. Left knee exam: Dressing clean and dry.  Calf soft and nontender.  Neurovascular status is intact distally down to the foot   Assessment/Plan: 1 Day Post-Op Procedure(s) (LRB): TOTAL KNEE ARTHROPLASTY (Left) Plan: Patient is doing well this morning. I will have her do a visit with physical therapy and ambulate.  She can then be discharged home after she passes physical therapy.  Follow-up with Dr. Berenice Primas in 2 weeks.  She will start outpatient physical therapy in the meantime and this has been set up. Her meds have been sent in already.  She will take aspirin enteric-coated 325 mg twice daily x1 month postop for DVT prophylaxis.   Erlene Senters 05/04/2022, 8:17 AM

## 2022-05-05 DIAGNOSIS — R7309 Other abnormal glucose: Secondary | ICD-10-CM | POA: Diagnosis not present

## 2022-05-05 DIAGNOSIS — I4892 Unspecified atrial flutter: Secondary | ICD-10-CM | POA: Diagnosis not present

## 2022-05-05 DIAGNOSIS — Z96652 Presence of left artificial knee joint: Secondary | ICD-10-CM | POA: Diagnosis not present

## 2022-05-05 DIAGNOSIS — M1712 Unilateral primary osteoarthritis, left knee: Secondary | ICD-10-CM | POA: Diagnosis not present

## 2022-05-05 DIAGNOSIS — I10 Essential (primary) hypertension: Secondary | ICD-10-CM | POA: Diagnosis not present

## 2022-05-05 NOTE — Progress Notes (Signed)
Physical Therapy Treatment Patient Details Name: Mandy Castillo MRN: [id number] DOB: [birth year] Today's Date: 05/05/2022   History of Present Illness Pt is an 81 year old female s/p L TKA on 05/03/22.  PMHx significant for HTN, memory impairment    PT Comments    Pt requested to use bathroom again and able to perform mobility with only min/guard assist for safety.   Less verbal cues provided this session as well.  Pt able to ambulate farther distance in hallway and denied any symptoms.  Pt feels ready for d/c home today.   Recommendations for follow up therapy are one component of a multi-disciplinary discharge planning process, led by the attending physician.  Recommendations may be updated based on patient status, additional functional criteria and insurance authorization.  Follow Up Recommendations  Follow physician's recommendations for discharge plan and follow up therapies     Assistance Recommended at Discharge Frequent or constant Supervision/Assistance  Patient can return home with the following A little help with walking and/or transfers;A little help with bathing/dressing/bathroom;Help with stairs or ramp for entrance   Equipment Recommendations  Rolling walker (2 wheels)    Recommendations for Other Services       Precautions / Restrictions Precautions Precautions: Fall;Knee Restrictions Weight Bearing Restrictions: No     Mobility  Bed Mobility Overal bed mobility: Needs Assistance Bed Mobility: Supine to Sit     Supine to sit: Supervision     General bed mobility comments: pt in recliner    Transfers Overall transfer level: Needs assistance Equipment used: Rolling walker (2 wheels) Transfers: Sit to/from Stand Sit to Stand: Min guard           General transfer comment: verbal cues for UE and LE positioning; cues for controlling descent    Ambulation/Gait Ambulation/Gait assistance: Min guard Gait Distance (Feet): 220 Feet Assistive device:  Rolling walker (2 wheels) Gait Pattern/deviations: Step-through pattern, Decreased stride length, Antalgic       General Gait Details: verbal cues for sequence, RW positioning, posture, allowing knee flexion; no symptoms   Stairs             Wheelchair Mobility    Modified Rankin (Stroke Patients Only)       Balance                                            Cognition Arousal/Alertness: Awake/alert Behavior During Therapy: WFL for tasks assessed/performed                                   General Comments: alert and orientated however observed memory impairment        Exercises      General Comments        Pertinent Vitals/Pain Pain Assessment Pain Assessment: No/denies pain Pain Intervention(s): Repositioned, Monitored during session    Home Living                          Prior Function            PT Goals (current goals can now be found in the care plan section) Progress towards PT goals: Progressing toward goals    Frequency    7X/week      PT Plan Current plan remains appropriate  Co-evaluation              AM-PAC PT "6 Clicks" Mobility   Outcome Measure  Help needed turning from your back to your side while in a flat bed without using bedrails?: A Little Help needed moving from lying on your back to sitting on the side of a flat bed without using bedrails?: A Little Help needed moving to and from a bed to a chair (including a wheelchair)?: A Little Help needed standing up from a chair using your arms (e.g., wheelchair or bedside chair)?: A Little Help needed to walk in hospital room?: A Little Help needed climbing 3-5 steps with a railing? : A Little 6 Click Score: 18    End of Session Equipment Utilized During Treatment: Gait belt Activity Tolerance: Patient tolerated treatment well Patient left: with call bell/phone within reach;in chair;with chair alarm set Nurse  Communication: Mobility status PT Visit Diagnosis: Difficulty in walking, not elsewhere classified (R26.2)     Time: 6629-4765 PT Time Calculation (min) (ACUTE ONLY): 18 min  Charges:  $Gait Training: 8-22 mins                     Arlyce Dice, DPT Physical Therapist Acute Rehabilitation Services Preferred contact method: Secure Chat Weekend Pager Only: [phone] Office: [phone]   Myrtis Hopping [place] 05/05/2022, 1:54 PM

## 2022-05-05 NOTE — Plan of Care (Signed)
Pt ready to go home with boyfriend.

## 2022-05-05 NOTE — Progress Notes (Signed)
Physical Therapy Treatment Patient Details Name: Mandy Castillo MRN: [id number] DOB: 21-Feb-1941 Today's Date: 05/05/2022   History of Present Illness Pt is an 81 year old female s/p L TKA on 05/03/22.  PMHx significant for HTN, memory impairment    PT Comments    Pt assisted to bathroom per request and then ambulated in hallway. Pt did not have any symptoms with mobility.    Recommendations for follow up therapy are one component of a multi-disciplinary discharge planning process, led by the attending physician.  Recommendations may be updated based on patient status, additional functional criteria and insurance authorization.  Follow Up Recommendations  Follow physician's recommendations for discharge plan and follow up therapies     Assistance Recommended at Discharge Frequent or constant Supervision/Assistance  Patient can return home with the following A little help with walking and/or transfers;A little help with bathing/dressing/bathroom;Help with stairs or ramp for entrance   Equipment Recommendations  Rolling walker (2 wheels)    Recommendations for Other Services       Precautions / Restrictions Precautions Precautions: Fall;Knee Restrictions Weight Bearing Restrictions: No     Mobility  Bed Mobility Overal bed mobility: Needs Assistance Bed Mobility: Supine to Sit     Supine to sit: Supervision          Transfers Overall transfer level: Needs assistance Equipment used: Rolling walker (2 wheels) Transfers: Sit to/from Stand Sit to Stand: Min guard           General transfer comment: verbal cues for UE and LE positioning to/from [facility] over toilet and then to sit on recliner; cues for controlling descent    Ambulation/Gait Ambulation/Gait assistance: Min guard Gait Distance (Feet): 120 Feet Assistive device: Rolling walker (2 wheels) Gait Pattern/deviations: Step-through pattern, Decreased stride length, Antalgic       General Gait Details: verbal  cues for sequence, RW positioning, posture, allowing knee flexion; no symptoms   Stairs             Wheelchair Mobility    Modified Rankin (Stroke Patients Only)       Balance                                            Cognition Arousal/Alertness: Awake/alert Behavior During Therapy: WFL for tasks assessed/performed                                   General Comments: alert and orientated however observed memory impairment        Exercises      General Comments        Pertinent Vitals/Pain Pain Assessment Pain Assessment: No/denies pain Pain Intervention(s): Repositioned, Monitored during session    Home Living                          Prior Function            PT Goals (current goals can now be found in the care plan section) Progress towards PT goals: Progressing toward goals    Frequency    7X/week      PT Plan Current plan remains appropriate    Co-evaluation              AM-PAC PT "6 Clicks" Mobility   Outcome Measure  Help needed turning from your back to your side while in a flat bed without using bedrails?: A Little Help needed moving from lying on your back to sitting on the side of a flat bed without using bedrails?: A Little Help needed moving to and from a bed to a chair (including a wheelchair)?: A Little Help needed standing up from a chair using your arms (e.g., wheelchair or bedside chair)?: A Little Help needed to walk in hospital room?: A Little Help needed climbing 3-5 steps with a railing? : A Little 6 Click Score: 18    End of Session Equipment Utilized During Treatment: Gait belt Activity Tolerance: Patient tolerated treatment well Patient left: with call bell/phone within reach;in chair;with chair alarm set Nurse Communication: Mobility status PT Visit Diagnosis: Difficulty in walking, not elsewhere classified (R26.2)     Time: 6754-4920 PT Time Calculation  (min) (ACUTE ONLY): 19 min  Charges:  $Gait Training: 8-22 mins                    Arlyce Dice, DPT Physical Therapist Acute Rehabilitation Services Preferred contact method: Secure Chat Weekend Pager Only: [phone] Office: [place] 05/05/2022, 1:46 PM

## 2022-05-06 DIAGNOSIS — I4892 Unspecified atrial flutter: Secondary | ICD-10-CM | POA: Diagnosis not present

## 2022-05-06 DIAGNOSIS — Z96652 Presence of left artificial knee joint: Secondary | ICD-10-CM | POA: Diagnosis not present

## 2022-05-06 DIAGNOSIS — G8929 Other chronic pain: Secondary | ICD-10-CM | POA: Diagnosis not present

## 2022-05-06 DIAGNOSIS — Z79899 Other long term (current) drug therapy: Secondary | ICD-10-CM | POA: Diagnosis not present

## 2022-05-06 DIAGNOSIS — H2512 Age-related nuclear cataract, left eye: Secondary | ICD-10-CM | POA: Diagnosis not present

## 2022-05-06 DIAGNOSIS — K219 Gastro-esophageal reflux disease without esophagitis: Secondary | ICD-10-CM | POA: Diagnosis not present

## 2022-05-06 DIAGNOSIS — M1711 Unilateral primary osteoarthritis, right knee: Secondary | ICD-10-CM | POA: Diagnosis not present

## 2022-05-06 DIAGNOSIS — Z7982 Long term (current) use of aspirin: Secondary | ICD-10-CM | POA: Diagnosis not present

## 2022-05-06 DIAGNOSIS — Z961 Presence of intraocular lens: Secondary | ICD-10-CM | POA: Diagnosis not present

## 2022-05-06 DIAGNOSIS — M542 Cervicalgia: Secondary | ICD-10-CM | POA: Diagnosis not present

## 2022-05-06 DIAGNOSIS — G3184 Mild cognitive impairment, so stated: Secondary | ICD-10-CM | POA: Diagnosis not present

## 2022-05-06 DIAGNOSIS — Z9181 History of falling: Secondary | ICD-10-CM | POA: Diagnosis not present

## 2022-05-06 DIAGNOSIS — R350 Frequency of micturition: Secondary | ICD-10-CM | POA: Diagnosis not present

## 2022-05-06 DIAGNOSIS — I10 Essential (primary) hypertension: Secondary | ICD-10-CM | POA: Diagnosis not present

## 2022-05-06 DIAGNOSIS — Z471 Aftercare following joint replacement surgery: Secondary | ICD-10-CM | POA: Diagnosis not present

## 2022-05-08 DIAGNOSIS — G3184 Mild cognitive impairment, so stated: Secondary | ICD-10-CM | POA: Diagnosis not present

## 2022-05-08 DIAGNOSIS — M1711 Unilateral primary osteoarthritis, right knee: Secondary | ICD-10-CM | POA: Diagnosis not present

## 2022-05-08 DIAGNOSIS — Z961 Presence of intraocular lens: Secondary | ICD-10-CM | POA: Diagnosis not present

## 2022-05-08 DIAGNOSIS — I10 Essential (primary) hypertension: Secondary | ICD-10-CM | POA: Diagnosis not present

## 2022-05-08 DIAGNOSIS — G8929 Other chronic pain: Secondary | ICD-10-CM | POA: Diagnosis not present

## 2022-05-08 DIAGNOSIS — H2512 Age-related nuclear cataract, left eye: Secondary | ICD-10-CM | POA: Diagnosis not present

## 2022-05-08 DIAGNOSIS — M542 Cervicalgia: Secondary | ICD-10-CM | POA: Diagnosis not present

## 2022-05-08 DIAGNOSIS — Z471 Aftercare following joint replacement surgery: Secondary | ICD-10-CM | POA: Diagnosis not present

## 2022-05-08 DIAGNOSIS — R350 Frequency of micturition: Secondary | ICD-10-CM | POA: Diagnosis not present

## 2022-05-08 DIAGNOSIS — Z7982 Long term (current) use of aspirin: Secondary | ICD-10-CM | POA: Diagnosis not present

## 2022-05-08 DIAGNOSIS — Z79899 Other long term (current) drug therapy: Secondary | ICD-10-CM | POA: Diagnosis not present

## 2022-05-08 DIAGNOSIS — Z96652 Presence of left artificial knee joint: Secondary | ICD-10-CM | POA: Diagnosis not present

## 2022-05-08 DIAGNOSIS — I4892 Unspecified atrial flutter: Secondary | ICD-10-CM | POA: Diagnosis not present

## 2022-05-08 DIAGNOSIS — K219 Gastro-esophageal reflux disease without esophagitis: Secondary | ICD-10-CM | POA: Diagnosis not present

## 2022-05-08 DIAGNOSIS — Z9181 History of falling: Secondary | ICD-10-CM | POA: Diagnosis not present

## 2022-05-10 DIAGNOSIS — R350 Frequency of micturition: Secondary | ICD-10-CM | POA: Diagnosis not present

## 2022-05-10 DIAGNOSIS — Z96652 Presence of left artificial knee joint: Secondary | ICD-10-CM | POA: Diagnosis not present

## 2022-05-10 DIAGNOSIS — H2512 Age-related nuclear cataract, left eye: Secondary | ICD-10-CM | POA: Diagnosis not present

## 2022-05-10 DIAGNOSIS — M542 Cervicalgia: Secondary | ICD-10-CM | POA: Diagnosis not present

## 2022-05-10 DIAGNOSIS — Z9181 History of falling: Secondary | ICD-10-CM | POA: Diagnosis not present

## 2022-05-10 DIAGNOSIS — G3184 Mild cognitive impairment, so stated: Secondary | ICD-10-CM | POA: Diagnosis not present

## 2022-05-10 DIAGNOSIS — Z471 Aftercare following joint replacement surgery: Secondary | ICD-10-CM | POA: Diagnosis not present

## 2022-05-10 DIAGNOSIS — Z7982 Long term (current) use of aspirin: Secondary | ICD-10-CM | POA: Diagnosis not present

## 2022-05-10 DIAGNOSIS — I4892 Unspecified atrial flutter: Secondary | ICD-10-CM | POA: Diagnosis not present

## 2022-05-10 DIAGNOSIS — M1711 Unilateral primary osteoarthritis, right knee: Secondary | ICD-10-CM | POA: Diagnosis not present

## 2022-05-10 DIAGNOSIS — K219 Gastro-esophageal reflux disease without esophagitis: Secondary | ICD-10-CM | POA: Diagnosis not present

## 2022-05-10 DIAGNOSIS — I10 Essential (primary) hypertension: Secondary | ICD-10-CM | POA: Diagnosis not present

## 2022-05-10 DIAGNOSIS — Z961 Presence of intraocular lens: Secondary | ICD-10-CM | POA: Diagnosis not present

## 2022-05-10 DIAGNOSIS — G8929 Other chronic pain: Secondary | ICD-10-CM | POA: Diagnosis not present

## 2022-05-10 DIAGNOSIS — Z79899 Other long term (current) drug therapy: Secondary | ICD-10-CM | POA: Diagnosis not present

## 2022-05-12 ENCOUNTER — Telehealth: Payer: Self-pay

## 2022-05-12 DIAGNOSIS — H2512 Age-related nuclear cataract, left eye: Secondary | ICD-10-CM | POA: Diagnosis not present

## 2022-05-12 DIAGNOSIS — Z7982 Long term (current) use of aspirin: Secondary | ICD-10-CM | POA: Diagnosis not present

## 2022-05-12 DIAGNOSIS — I10 Essential (primary) hypertension: Secondary | ICD-10-CM | POA: Diagnosis not present

## 2022-05-12 DIAGNOSIS — Z9181 History of falling: Secondary | ICD-10-CM | POA: Diagnosis not present

## 2022-05-12 DIAGNOSIS — I4892 Unspecified atrial flutter: Secondary | ICD-10-CM | POA: Diagnosis not present

## 2022-05-12 DIAGNOSIS — Z79899 Other long term (current) drug therapy: Secondary | ICD-10-CM | POA: Diagnosis not present

## 2022-05-12 DIAGNOSIS — G3184 Mild cognitive impairment, so stated: Secondary | ICD-10-CM | POA: Diagnosis not present

## 2022-05-12 DIAGNOSIS — Z96652 Presence of left artificial knee joint: Secondary | ICD-10-CM | POA: Diagnosis not present

## 2022-05-12 DIAGNOSIS — R350 Frequency of micturition: Secondary | ICD-10-CM | POA: Diagnosis not present

## 2022-05-12 DIAGNOSIS — Z961 Presence of intraocular lens: Secondary | ICD-10-CM | POA: Diagnosis not present

## 2022-05-12 DIAGNOSIS — K219 Gastro-esophageal reflux disease without esophagitis: Secondary | ICD-10-CM | POA: Diagnosis not present

## 2022-05-12 DIAGNOSIS — M542 Cervicalgia: Secondary | ICD-10-CM | POA: Diagnosis not present

## 2022-05-12 DIAGNOSIS — M1711 Unilateral primary osteoarthritis, right knee: Secondary | ICD-10-CM | POA: Diagnosis not present

## 2022-05-12 DIAGNOSIS — Z471 Aftercare following joint replacement surgery: Secondary | ICD-10-CM | POA: Diagnosis not present

## 2022-05-12 DIAGNOSIS — G8929 Other chronic pain: Secondary | ICD-10-CM | POA: Diagnosis not present

## 2022-05-12 NOTE — Chronic Care Management (AMB) (Addendum)
    Chronic Care Management Pharmacy Assistant   Name: Mandy Castillo  MRN: [id number] DOB: 22-Oct-1940  Reason for Encounter: Hospital Follow Up Non CCM    Medications: Outpatient Encounter Medications as of 05/12/2022  Medication Sig   aspirin EC 325 MG tablet Take 1 tablet (325 mg total) by mouth 2 (two) times daily after a meal. Take x 1 month post op to decrease risk of blood clots.   Cholecalciferol (D3 PO) Take 1 tablet by mouth daily.   Cyanocobalamin (B-12 PO) Take 1 tablet by mouth daily.   docusate sodium (COLACE) 100 MG capsule Take 1 capsule (100 mg total) by mouth 2 (two) times daily.   donepezil (ARICEPT) 10 MG tablet Take 10 mg by mouth at bedtime.   HYDROcodone-acetaminophen (NORCO) 5-325 MG tablet Take 1-2 tablets by mouth every 6 (six) hours as needed for moderate pain.   metoprolol succinate (TOPROL-XL) 50 MG 24 hr tablet Take 1 tablet (50 mg total) by mouth daily.   omeprazole (PRILOSEC) 20 MG capsule TAKE 1 CAPSULE BY MOUTH EVERY DAY   tiZANidine (ZANAFLEX) 2 MG tablet Take 1 tablet (2 mg total) by mouth every 8 (eight) hours as needed for muscle spasms.   No facility-administered encounter medications on file as of 05/12/2022.     Reviewed hospital notes for details of recent visit. Has patient been contacted by Transitions of Care team? No Has patient seen PCP/specialist for hospital follow up (summarize OV if yes): No  Admitted to the hospital on 05/03/22. Discharge date was 05/05/22.  Discharged from [facility].   Discharge diagnosis (Principal Problem): primary osterarthritis Left knee Patient was discharged to Home with Home Health  Brief summary of hospital course: Mandy Castillo is an 81 y.o. female who was admitted 05/03/2022 for operative treatment ofPrimary osteoarthritis of left knee. Patient has severe unremitting pain that affects sleep, daily activities, and work/hobbies. After pre-op clearance the patient was taken to the operating room on  05/03/2022 and underwent  Procedure(s): Left TOTAL KNEE ARTHROPLASTY.   Patient was given sequential compression devices, early ambulation, and chemoprophylaxis to prevent DVT.   Patient benefited maximally from hospital stay and there were no complications.  The patient made slow progress with physical therapy on postop day 1 and had a vagal episode where she nearly passed out while walking with therapy.  Due to this she was kept an additional night for safety reasons.  On the date of discharge the patient ambulated with physical therapy in the hall and did great  New?Medications Started at [facility] Discharge:?? -Started colace  Hydrocodone/acetaminophen  tizanidine   Medication Changes at Hospital Discharge: -Changed aspirin  Medications that remain the same after Hospital Discharge:??  -All other medications will remain the same.    Next CCM appt: none  Other upcoming appts: No appointments scheduled within the next 30 days.  Charlene Brooke, PharmD notified and will determine if action is needed.  Avel Sensor, [place]  [phone]   Pharmacist addendum: Patient admitted for planned TKA. No additional action needed.  Charlene Brooke, PharmD, BCACP 05/14/22 1:49 PM

## 2022-05-14 DIAGNOSIS — G3184 Mild cognitive impairment, so stated: Secondary | ICD-10-CM | POA: Diagnosis not present

## 2022-05-14 DIAGNOSIS — I10 Essential (primary) hypertension: Secondary | ICD-10-CM | POA: Diagnosis not present

## 2022-05-14 DIAGNOSIS — M542 Cervicalgia: Secondary | ICD-10-CM | POA: Diagnosis not present

## 2022-05-14 DIAGNOSIS — I4892 Unspecified atrial flutter: Secondary | ICD-10-CM | POA: Diagnosis not present

## 2022-05-14 DIAGNOSIS — G8929 Other chronic pain: Secondary | ICD-10-CM | POA: Diagnosis not present

## 2022-05-14 DIAGNOSIS — K219 Gastro-esophageal reflux disease without esophagitis: Secondary | ICD-10-CM | POA: Diagnosis not present

## 2022-05-14 DIAGNOSIS — Z471 Aftercare following joint replacement surgery: Secondary | ICD-10-CM | POA: Diagnosis not present

## 2022-05-14 DIAGNOSIS — H2512 Age-related nuclear cataract, left eye: Secondary | ICD-10-CM | POA: Diagnosis not present

## 2022-05-14 DIAGNOSIS — Z7982 Long term (current) use of aspirin: Secondary | ICD-10-CM | POA: Diagnosis not present

## 2022-05-14 DIAGNOSIS — R350 Frequency of micturition: Secondary | ICD-10-CM | POA: Diagnosis not present

## 2022-05-14 DIAGNOSIS — Z961 Presence of intraocular lens: Secondary | ICD-10-CM | POA: Diagnosis not present

## 2022-05-14 DIAGNOSIS — M1711 Unilateral primary osteoarthritis, right knee: Secondary | ICD-10-CM | POA: Diagnosis not present

## 2022-05-14 DIAGNOSIS — Z79899 Other long term (current) drug therapy: Secondary | ICD-10-CM | POA: Diagnosis not present

## 2022-05-14 DIAGNOSIS — Z96652 Presence of left artificial knee joint: Secondary | ICD-10-CM | POA: Diagnosis not present

## 2022-05-14 DIAGNOSIS — Z9181 History of falling: Secondary | ICD-10-CM | POA: Diagnosis not present

## 2022-05-17 DIAGNOSIS — Z79899 Other long term (current) drug therapy: Secondary | ICD-10-CM | POA: Diagnosis not present

## 2022-05-17 DIAGNOSIS — Z7982 Long term (current) use of aspirin: Secondary | ICD-10-CM | POA: Diagnosis not present

## 2022-05-17 DIAGNOSIS — M542 Cervicalgia: Secondary | ICD-10-CM | POA: Diagnosis not present

## 2022-05-17 DIAGNOSIS — G8929 Other chronic pain: Secondary | ICD-10-CM | POA: Diagnosis not present

## 2022-05-17 DIAGNOSIS — Z9181 History of falling: Secondary | ICD-10-CM | POA: Diagnosis not present

## 2022-05-17 DIAGNOSIS — I10 Essential (primary) hypertension: Secondary | ICD-10-CM | POA: Diagnosis not present

## 2022-05-17 DIAGNOSIS — R350 Frequency of micturition: Secondary | ICD-10-CM | POA: Diagnosis not present

## 2022-05-17 DIAGNOSIS — Z96652 Presence of left artificial knee joint: Secondary | ICD-10-CM | POA: Diagnosis not present

## 2022-05-17 DIAGNOSIS — H2512 Age-related nuclear cataract, left eye: Secondary | ICD-10-CM | POA: Diagnosis not present

## 2022-05-17 DIAGNOSIS — Z961 Presence of intraocular lens: Secondary | ICD-10-CM | POA: Diagnosis not present

## 2022-05-17 DIAGNOSIS — K219 Gastro-esophageal reflux disease without esophagitis: Secondary | ICD-10-CM | POA: Diagnosis not present

## 2022-05-17 DIAGNOSIS — I4892 Unspecified atrial flutter: Secondary | ICD-10-CM | POA: Diagnosis not present

## 2022-05-17 DIAGNOSIS — Z471 Aftercare following joint replacement surgery: Secondary | ICD-10-CM | POA: Diagnosis not present

## 2022-05-17 DIAGNOSIS — G3184 Mild cognitive impairment, so stated: Secondary | ICD-10-CM | POA: Diagnosis not present

## 2022-05-17 DIAGNOSIS — M1711 Unilateral primary osteoarthritis, right knee: Secondary | ICD-10-CM | POA: Diagnosis not present

## 2022-05-18 DIAGNOSIS — R269 Unspecified abnormalities of gait and mobility: Secondary | ICD-10-CM | POA: Diagnosis not present

## 2022-05-18 DIAGNOSIS — M1712 Unilateral primary osteoarthritis, left knee: Secondary | ICD-10-CM | POA: Diagnosis not present

## 2022-05-18 DIAGNOSIS — Z9889 Other specified postprocedural states: Secondary | ICD-10-CM | POA: Diagnosis not present

## 2022-05-18 DIAGNOSIS — Z96652 Presence of left artificial knee joint: Secondary | ICD-10-CM | POA: Diagnosis not present

## 2022-05-18 DIAGNOSIS — R531 Weakness: Secondary | ICD-10-CM | POA: Diagnosis not present

## 2022-05-20 DIAGNOSIS — R269 Unspecified abnormalities of gait and mobility: Secondary | ICD-10-CM | POA: Diagnosis not present

## 2022-05-20 DIAGNOSIS — R531 Weakness: Secondary | ICD-10-CM | POA: Diagnosis not present

## 2022-05-20 DIAGNOSIS — Z9889 Other specified postprocedural states: Secondary | ICD-10-CM | POA: Diagnosis not present

## 2022-05-20 DIAGNOSIS — Z96652 Presence of left artificial knee joint: Secondary | ICD-10-CM | POA: Diagnosis not present

## 2022-05-26 DIAGNOSIS — R269 Unspecified abnormalities of gait and mobility: Secondary | ICD-10-CM | POA: Diagnosis not present

## 2022-05-26 DIAGNOSIS — R531 Weakness: Secondary | ICD-10-CM | POA: Diagnosis not present

## 2022-05-26 DIAGNOSIS — Z9889 Other specified postprocedural states: Secondary | ICD-10-CM | POA: Diagnosis not present

## 2022-05-26 DIAGNOSIS — Z96652 Presence of left artificial knee joint: Secondary | ICD-10-CM | POA: Diagnosis not present

## 2022-05-27 DIAGNOSIS — M25462 Effusion, left knee: Secondary | ICD-10-CM | POA: Diagnosis not present

## 2022-05-27 DIAGNOSIS — Z471 Aftercare following joint replacement surgery: Secondary | ICD-10-CM | POA: Diagnosis not present

## 2022-05-27 DIAGNOSIS — Z96642 Presence of left artificial hip joint: Secondary | ICD-10-CM | POA: Diagnosis not present

## 2022-05-28 DIAGNOSIS — R269 Unspecified abnormalities of gait and mobility: Secondary | ICD-10-CM | POA: Diagnosis not present

## 2022-05-28 DIAGNOSIS — Z96652 Presence of left artificial knee joint: Secondary | ICD-10-CM | POA: Diagnosis not present

## 2022-05-28 DIAGNOSIS — R531 Weakness: Secondary | ICD-10-CM | POA: Diagnosis not present

## 2022-05-28 DIAGNOSIS — Z9889 Other specified postprocedural states: Secondary | ICD-10-CM | POA: Diagnosis not present

## 2022-06-02 DIAGNOSIS — Z9889 Other specified postprocedural states: Secondary | ICD-10-CM | POA: Diagnosis not present

## 2022-06-02 DIAGNOSIS — R269 Unspecified abnormalities of gait and mobility: Secondary | ICD-10-CM | POA: Diagnosis not present

## 2022-06-02 DIAGNOSIS — Z96652 Presence of left artificial knee joint: Secondary | ICD-10-CM | POA: Diagnosis not present

## 2022-06-02 DIAGNOSIS — R531 Weakness: Secondary | ICD-10-CM | POA: Diagnosis not present

## 2022-06-09 DIAGNOSIS — Z96652 Presence of left artificial knee joint: Secondary | ICD-10-CM | POA: Diagnosis not present

## 2022-06-09 DIAGNOSIS — R531 Weakness: Secondary | ICD-10-CM | POA: Diagnosis not present

## 2022-06-09 DIAGNOSIS — Z9889 Other specified postprocedural states: Secondary | ICD-10-CM | POA: Diagnosis not present

## 2022-06-09 DIAGNOSIS — R269 Unspecified abnormalities of gait and mobility: Secondary | ICD-10-CM | POA: Diagnosis not present

## 2022-06-10 DIAGNOSIS — Z471 Aftercare following joint replacement surgery: Secondary | ICD-10-CM | POA: Diagnosis not present

## 2022-06-10 DIAGNOSIS — M1711 Unilateral primary osteoarthritis, right knee: Secondary | ICD-10-CM | POA: Diagnosis not present

## 2022-06-11 DIAGNOSIS — Z96652 Presence of left artificial knee joint: Secondary | ICD-10-CM | POA: Diagnosis not present

## 2022-06-11 DIAGNOSIS — R269 Unspecified abnormalities of gait and mobility: Secondary | ICD-10-CM | POA: Diagnosis not present

## 2022-06-11 DIAGNOSIS — Z9889 Other specified postprocedural states: Secondary | ICD-10-CM | POA: Diagnosis not present

## 2022-06-11 DIAGNOSIS — R531 Weakness: Secondary | ICD-10-CM | POA: Diagnosis not present

## 2022-07-14 NOTE — Progress Notes (Unsigned)
07/15/2022 9:58 AM   Mandy Castillo [birth year] [id number]  Referring provider: Gweneth Dimitri, MD [address] [place],  [place] [address]  Urological history: 1. OAB -Contributing factors of age, vaginal atrophy, hypertension, anxiety and pelvic surgery -cysto (06/2021) - NED -PVR 46 mL  -Gemtesa 75 mg daily  HPI: Mandy Castillo is a 81 y.o. female who presents today for follow up.  Since last seen by [place] she has had a left knee replacement.  She is having 1-7 daytime urinations, 1-2 nocturia with a mild urge to urinate. Patient denies any modifying or aggravating factors.  Patient denies any gross hematuria, dysuria or suprapubic/flank pain.  Patient denies any fevers, chills, nausea or vomiting.  She has not had any urinary leakage.  She states that she has been without the [facility] for over a month and has not noted any worsening of her urinary symptoms.  Patient denies any modifying or aggravating factors.  Patient denies any gross hematuria, dysuria or suprapubic/flank pain.  Patient denies any fevers, chills, nausea or vomiting.    PVR 46 mL    PMH: Past Medical History:  Diagnosis Date   Arthritis    GERD (gastroesophageal reflux disease)    Hypertension    Sebaceous cyst 07/24/2020    Surgical History: Past Surgical History:  Procedure Laterality Date   ABDOMINAL HYSTERECTOMY     APPENDECTOMY     BREAST SURGERY     cyst removed   CHOLECYSTECTOMY     CYST REMOVAL NECK     TOTAL KNEE ARTHROPLASTY Left 05/03/2022   Procedure: TOTAL KNEE ARTHROPLASTY;  Surgeon: Jodi Geralds, MD;  Location: [facility];  Service: Orthopedics;  Laterality: Left;    Home Medications:  Allergies as of 07/15/2022   No Known Allergies      Medication List        Accurate as of July 15, 2022  9:58 AM. If you have any questions, ask your nurse or doctor.          aspirin EC 325 MG tablet Take 1 tablet (325 mg total) by mouth 2 (two) times daily after a meal. Take x 1  month post op to decrease risk of blood clots.   B-12 PO Take 1 tablet by mouth daily.   celecoxib 100 MG capsule Commonly known as: CELEBREX Take 100 mg by mouth 2 (two) times daily.   D3 PO Take 1 tablet by mouth daily.   docusate sodium 100 MG capsule Commonly known as: Colace Take 1 capsule (100 mg total) by mouth 2 (two) times daily.   donepezil 10 MG tablet Commonly known as: ARICEPT Take 10 mg by mouth at bedtime.   HYDROcodone-acetaminophen 5-325 MG tablet Commonly known as: Norco Take 1-2 tablets by mouth every 6 (six) hours as needed for moderate pain.   metoprolol succinate 50 MG 24 hr tablet Commonly known as: TOPROL-XL Take 1 tablet (50 mg total) by mouth daily.   omeprazole 20 MG capsule Commonly known as: PRILOSEC TAKE 1 CAPSULE BY MOUTH EVERY DAY   tiZANidine 2 MG tablet Commonly known as: ZANAFLEX Take 1 tablet (2 mg total) by mouth every 8 (eight) hours as needed for muscle spasms.        Allergies: No Known Allergies  Family History: Family History  Problem Relation Age of Onset   Stroke Mother 97   Prostate cancer Father     Social History:  reports that she has never smoked. She has never used smokeless  tobacco. She reports current alcohol use. She reports that she does not use drugs.  ROS: Pertinent ROS in HPI  Physical Exam: BP (!) 174/81   Pulse 90   Ht 5\' 8"  (1.727 m)   Wt 170 lb (77.1 kg)   BMI 25.85 kg/m   Constitutional:  Well nourished. Alert and oriented, No acute distress. HEENT: [place] AT, moist mucus membranes.  Trachea midline, no masses. Cardiovascular: No clubbing, cyanosis, or edema. Respiratory: Normal respiratory effort, no increased work of breathing. Neurologic: Grossly intact, no focal deficits, moving all 4 extremities. Psychiatric: Normal mood and affect.    Laboratory Data: Lab Results  Component Value Date   WBC 4.6 04/20/2022   HGB 13.2 04/20/2022   HCT 41.4 04/20/2022   MCV 91.8 04/20/2022   PLT 302  04/20/2022    Lab Results  Component Value Date   CREATININE 0.78 04/20/2022       Component Value Date/Time   CHOL 250 (H) 02/09/2022 1135   HDL 137.90 02/09/2022 1135   CHOLHDL 2 02/09/2022 1135   VLDL 11.6 02/09/2022 1135   LDLCALC 101 (H) 02/09/2022 1135    Lab Results  Component Value Date   AST 20 02/09/2022   Lab Results  Component Value Date   ALT 16 02/09/2022  I have reviewed the labs.   Pertinent Imaging:  07/15/22 09:32  Scan Result 07/17/22    Assessment & Plan:    1. OAB -She does not want to restart the Gemtesa at this time as she feels her urinary symptoms have abated and she has been without the medication for approximately 1 month -It may be that she is still under the effects of the medication, but I will not refill at this time -She will contact if her urinary symptoms return or if she experiences some other urological issue   Return if symptoms worsen or fail to improve.  These notes generated with voice recognition software. I apologize for typographical errors.  [place]  [facility] [address]  [address] [place], [place] [phone]

## 2022-07-15 ENCOUNTER — Encounter: Payer: Self-pay | Admitting: Urology

## 2022-07-15 ENCOUNTER — Ambulatory Visit (INDEPENDENT_AMBULATORY_CARE_PROVIDER_SITE_OTHER): Payer: Medicare Other | Admitting: Urology

## 2022-07-15 VITALS — BP 174/81 | HR 90 | Ht 68.0 in | Wt 170.0 lb

## 2022-07-15 DIAGNOSIS — N3281 Overactive bladder: Secondary | ICD-10-CM | POA: Diagnosis not present

## 2022-07-15 LAB — BLADDER SCAN AMB NON-IMAGING

## 2022-08-12 DIAGNOSIS — M25561 Pain in right knee: Secondary | ICD-10-CM | POA: Diagnosis not present

## 2022-08-12 DIAGNOSIS — M25562 Pain in left knee: Secondary | ICD-10-CM | POA: Diagnosis not present

## 2022-08-23 ENCOUNTER — Telehealth: Payer: Self-pay

## 2022-08-23 NOTE — Telephone Encounter (Signed)
Received Pre op  clearance form [facility]. Patient needs to have TOC appointment set up. She has not been seen in office after 02/09/22 with Dr. Einar Pheasant. Please let me know once appointment has been made and who it is scheduled with.   Form printed and holding for information.

## 2022-08-23 NOTE — Telephone Encounter (Signed)
Phone on file isn't In service

## 2022-08-24 NOTE — Telephone Encounter (Signed)
My chart sent to patient to call office.

## 2022-08-31 NOTE — Telephone Encounter (Signed)
Patient has not been seen in our office to set up new PCP. I have called number not working and sent message via my chart will close message until patient reaches out to office.

## 2022-08-31 NOTE — Progress Notes (Signed)
Cardiology Clinic Note   Patient Name: Mandy Castillo Date of Encounter: 09/03/2022  Primary Care Provider:  Waunita Schooner, MD Primary Cardiologist:  Shelva Majestic, MD  Patient Profile    82 year old female with hx of SVT, HTN, GERD, who is here for pre-operative evaluation for [facility] with Dr. Berenice Primas, for a Right TKR.  She has not been seen in the office since 2021.   Past Medical History    Past Medical History:  Diagnosis Date   Arthritis    GERD (gastroesophageal reflux disease)    Hypertension    Sebaceous cyst 07/24/2020   Past Surgical History:  Procedure Laterality Date   ABDOMINAL HYSTERECTOMY     APPENDECTOMY     BREAST SURGERY     cyst removed   CHOLECYSTECTOMY     CYST REMOVAL NECK     TOTAL KNEE ARTHROPLASTY Left 05/03/2022   Procedure: TOTAL KNEE ARTHROPLASTY;  Surgeon: Dorna Leitz, MD;  Location: [facility];  Service: Orthopedics;  Laterality: Left;    Allergies  No Known Allergies  History of Present Illness    Mandy Castillo comes to the office today after not being seen since 2021 for pre-operative evaluation for right TKR with Dr.Graves  from [facility].  She is here today without any cardiac complaints.  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  She is normally very active walking a 1/2 mile to 1 mile 4-5 times a week but has decreased this due to weather and painful knees.  This has been recent decrease in exercise capacity.  She denies chest pain, shortness of breath, or excessive fatigue or palpitations associated with exercise.  She is medically compliant and offers no symptoms of near syncope, or dizziness.  She is normally active throughout her house does not like sitting around.  She is medically compliant.  Home Medications    Current Outpatient Medications  Medication Sig Dispense Refill   aspirin EC 325 MG tablet Take 1 tablet (325 mg total) by mouth 2 (two) times daily after a  meal. Take x 1 month post op to decrease risk of blood clots. 60 tablet 0   celecoxib (CELEBREX) 100 MG capsule Take 100 mg by mouth 2 (two) times daily.     Cholecalciferol (D3 PO) Take 1 tablet by mouth daily.     Cyanocobalamin (B-12 PO) Take 1 tablet by mouth daily.     docusate sodium (COLACE) 100 MG capsule Take 1 capsule (100 mg total) by mouth 2 (two) times daily. 30 capsule 0   donepezil (ARICEPT) 10 MG tablet Take 10 mg by mouth at bedtime.     HYDROcodone-acetaminophen (NORCO) 5-325 MG tablet Take 1-2 tablets by mouth every 6 (six) hours as needed for moderate pain. 40 tablet 0   metoprolol succinate (TOPROL-XL) 50 MG 24 hr tablet Take 1 tablet (50 mg total) by mouth daily. 90 tablet 1   omeprazole (PRILOSEC) 20 MG capsule TAKE 1 CAPSULE BY MOUTH EVERY DAY 90 capsule 1   tiZANidine (ZANAFLEX) 2 MG tablet Take 1 tablet (2 mg total) by mouth every 8 (eight) hours as needed for muscle spasms. 40 tablet 0   No current facility-administered medications for this visit.     Family History    Family History  Problem Relation Age of Onset   Stroke Mother 15   Prostate cancer Father    She indicated that the status of her mother is unknown. She  indicated that the status of her father is unknown.  Social History    Social History   Socioeconomic History   Marital status: Single    Spouse name: Not on file   Number of children: 0   Years of education: master's degree   Highest education level: Not on file  Occupational History   Not on file  Tobacco Use   Smoking status: Never   Smokeless tobacco: Never  Vaping Use   Vaping Use: Never used  Substance and Sexual Activity   Alcohol use: Yes    Comment: wine every few months   Drug use: Never   Sexual activity: Not Currently  Other Topics Concern   Not on file  Social History Narrative   04/21/20   From: [place], [place]: with partner - Tollie [employer] - 2011   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, occupational and international relations   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  Cardiovascular:  No chest pain, dyspnea on exertion, edema, orthopnea, palpitations, paroxysmal nocturnal dyspnea. Dermatological: No rash, lesions/masses Respiratory: No cough, dyspnea Urologic: No hematuria, dysuria Abdominal:   No nausea, vomiting, diarrhea, bright red blood per rectum, melena, or hematemesis Neurologic:  No visual changes, wkns, changes in mental status. All other systems reviewed and are otherwise negative except as noted above.     Physical Exam    VS:  BP 136/78   Pulse 83   Ht 5' 8"$  (1.727 m)   Wt 170 lb 6.4 oz (77.3 kg)   SpO2 96%   BMI 25.91 kg/m  , BMI Body mass index is 25.91 kg/m.     GEN: Well nourished, well developed, in no acute distress. HEENT: normal. Neck: Supple, no JVD, carotid bruits, or masses. Cardiac: RRR, no murmurs, rubs, or gallops. No clubbing, cyanosis, edema.  Radials/DP/PT 2+ and equal bilaterally.  Respiratory:  Respirations regular and unlabored, clear to auscultation bilaterally. GI: Soft, nontender, nondistended, BS + x 4. MS: no deformity or atrophy.  Painful range of motion of the right knee but is able to stand on her right leg without difficulty or buckling. Skin: warm and dry, no rash. Neuro:   Strength and sensation are intact. Psych: Normal affect.  Accessory Clinical Findings    ECG personally reviewed by me today-normal sinus rhythm with nonspecific T wave abnormality noted in V45 and 6 with flattening.  No evidence of heart block.  Heart rate of 83 bpm- No acute changes  Lab Results  Component Value Date   WBC 4.6 04/20/2022   HGB 13.2 04/20/2022   HCT 41.4 04/20/2022  MCV 91.8 04/20/2022   PLT 302 04/20/2022   Lab Results  Component Value Date   CREATININE 0.78 04/20/2022   BUN 11 04/20/2022   NA 140 04/20/2022   K 4.2 04/20/2022   CL 108 04/20/2022   CO2 27 04/20/2022   Lab Results  Component Value Date   ALT 16 02/09/2022   AST 20 02/09/2022   ALKPHOS 78 02/09/2022   BILITOT 0.6 02/09/2022   Lab Results  Component Value Date   CHOL 250 (H) 02/09/2022   HDL 137.90 02/09/2022   LDLCALC 101 (H) 02/09/2022   TRIG 58.0 02/09/2022   CHOLHDL 2 02/09/2022    No results found for: "HGBA1C"  Review of Prior Studies:  Echocardiogram 03/28/2019  1. The left ventricle has normal systolic function with an ejection  fraction of 60-65%. The cavity size was normal. There is mildly increased  left ventricular wall thickness. Left ventricular diastolic Doppler  parameters are consistent with impaired  relaxation. No evidence of left ventricular regional wall motion  abnormalities.   2. The right ventricle has normal systolic function. The cavity was  normal. There is no increase in right ventricular wall thickness.   3. The pericardial effusion is circumferential.   4. Trivial pericardial effusion is present.   5. The mitral valve is grossly normal. Mild thickening of the mitral  valve leaflet.   6. The tricuspid valve is grossly normal.   7. The aortic valve is tricuspid. Mild thickening of the aortic valve. No  stenosis of the aortic valve.   8. The aorta is normal unless otherwise noted.   9. The aortic root is normal in size and structure.  10. When  compared to the prior study: No prior study.   Assessment & Plan   1.  Preoperative cardiac assessment: Able to complete >4.0 METS. Per Revised Cardiac Risk Index, consider high risk with >11% chance of adverse cardiac events perioperatively given history of CAD, CHF, and stroke. She is optimized from a cardiac standpoint. Patient is aware that he is at elevated risk and accepts this. Therefore, based on ACC/AHA guidelines, patient would be at acceptable risk for the planned procedure without further cardiovascular testing.   2.  Hypertension: Blood pressures currently well-controlled, I rechecked it in the clinic room after she had rested her bed and it came down from 144/78 to 136/72.  Continue current medication regimen at this time without changes.  I will however repeat her echocardiogram as it has not been done and for years to evaluate LV function and status.   Phill Myron. Tyreisha Ungar DNP, ANP, AACC  09/03/2022, 9:59 AM       Current medicines are reviewed at length with the patient today.  I have spent 25 min's  dedicated to the care of this patient on the date of this encounter to include pre-visit review of records, assessment, management and diagnostic testing,with shared decision making. Signed, Phill Myron. [place] Pugh, ANP, AACC   09/03/2022 9:57 AM      Office [phone] Fax [phone]  Notice: This dictation was prepared with Dragon dictation along with smaller phrase technology. Any transcriptional errors that result from this process are unintentional and may not be corrected upon review.

## 2022-09-03 ENCOUNTER — Ambulatory Visit: Payer: Medicare Other | Attending: Adult Health | Admitting: Adult Health

## 2022-09-03 ENCOUNTER — Encounter: Payer: Self-pay | Admitting: Adult Health

## 2022-09-03 VITALS — BP 136/78 | HR 83 | Ht 68.0 in | Wt 170.4 lb

## 2022-09-03 DIAGNOSIS — I10 Essential (primary) hypertension: Secondary | ICD-10-CM | POA: Diagnosis not present

## 2022-09-03 DIAGNOSIS — Z01818 Encounter for other preprocedural examination: Secondary | ICD-10-CM | POA: Diagnosis not present

## 2022-09-03 NOTE — Patient Instructions (Addendum)
Medication Instructions:  No Changes *If you need a refill on your cardiac medications before your next appointment, please call your pharmacy*   Lab Work: No Labs If you have labs (blood work) drawn today and your tests are completely normal, you will receive your results only by: [place] (if you have MyChart) OR A paper copy in the mail If you have any lab test that is abnormal or we need to change your treatment, we will call you to review the results.   Testing/Procedures: [address], [address]. Your physician has requested that you have an echocardiogram. Echocardiography is a painless test that uses sound waves to create images of your heart. It provides your doctor with information about the size and shape of your heart and how well your heart's chambers and valves are working. This procedure takes approximately one hour. There are no restrictions for this procedure. Please do NOT wear cologne, perfume, aftershave, or lotions (deodorant is allowed). Please arrive 15 minutes prior to your appointment time.    Follow-Up: At [facility], you and your health needs are our priority.  As part of our continuing mission to provide you with exceptional heart care, we have created designated Provider Care Teams.  These Care Teams include your primary Cardiologist (physician) and Advanced Practice Providers (APPs -  Physician Assistants and Nurse Practitioners) who all work together to provide you with the care you need, when you need it.  We recommend signing up for the patient portal called "MyChart".  Sign up information is provided on this After Visit Summary.  MyChart is used to connect with patients for Virtual Visits (Telemedicine).  Patients are able to view lab/test results, encounter notes, upcoming appointments, etc.  Non-urgent messages can be sent to your provider as well.   To learn more about what you can do with MyChart, go to  [email or web address].    Your next appointment:   6 month(s)  Provider:   Shelva Majestic, MD

## 2022-09-10 ENCOUNTER — Telehealth: Payer: Self-pay | Admitting: *Deleted

## 2022-09-10 NOTE — Telephone Encounter (Signed)
This    Pre-operative Risk Assessment    Patient Name: Mandy Castillo  DOB: [birth year] MRN: [id number]      Request for Surgical Clearance    Procedure:   Right Total Knee Arthroplasty  Date of Surgery:  Clearance TBD                                 Surgeon: Dr. Lowella Petties Surgeon's Group or Practice Name:   [facility] Phone number:  [phone] Fax number:  [phone]   Type of Clearance Requested:   - Medical  - Pharmacy:  Hold Aspirin TBD   Type of Anesthesia:  Spinal   Additional requests/questions:    Signed, Greer Ee   09/10/2022, 10:59 AM   The pt was seen on 2/9 by Levon Hedger. The clearance was not entered.  Please address.

## 2022-09-29 ENCOUNTER — Ambulatory Visit (INDEPENDENT_AMBULATORY_CARE_PROVIDER_SITE_OTHER): Payer: Medicare Other

## 2022-09-29 ENCOUNTER — Encounter: Payer: Self-pay | Admitting: *Deleted

## 2022-09-29 VITALS — Ht 68.0 in | Wt 170.0 lb

## 2022-09-29 DIAGNOSIS — Z0000 Encounter for general adult medical examination without abnormal findings: Secondary | ICD-10-CM

## 2022-09-29 DIAGNOSIS — Z135 Encounter for screening for eye and ear disorders: Secondary | ICD-10-CM | POA: Diagnosis not present

## 2022-09-29 NOTE — Patient Instructions (Addendum)
Mandy Castillo , Thank you for taking time to come for your Medicare Wellness Visit. I appreciate your ongoing commitment to your health goals. Please review the following plan we discussed and let me know if I can assist you in the future.   These are the goals we discussed:  Goals      Weight (lb) < 165 lb (74.8 kg)     Lose weight and exercise more        This is a list of the screening recommended for you and due dates:  Health Maintenance  Topic Date Due   Zoster (Shingles) Vaccine (1 of 2) Never done   Pneumonia Vaccine (1 of 1 - PCV) Never done   DEXA scan (bone density measurement)  Never done   Flu Shot  02/23/2022   COVID-19 Vaccine (6 - 2023-24 season) 03/26/2022   Medicare Annual Wellness Visit  09/29/2023   DTaP/Tdap/Td vaccine (2 - Td or Tdap) 10/07/2031   HPV Vaccine  Aged Out    Advanced directives: no  Conditions/risks identified: falls risk  Next appointment: Follow up in one year for your annual wellness visit 10/04/2023 @ 10:45am telephone   Preventive Care 65 Years and Older, Female Preventive care refers to lifestyle choices and visits with your health care provider that can promote health and wellness. What does preventive care include? A yearly physical exam. This is also called an annual well check. Dental exams once or twice a year. Routine eye exams. Ask your health care provider how often you should have your eyes checked. Personal lifestyle choices, including: Daily care of your teeth and gums. Regular physical activity. Eating a healthy diet. Avoiding tobacco and drug use. Limiting alcohol use. Practicing safe sex. Taking low-dose aspirin every day. Taking vitamin and mineral supplements as recommended by your health care provider. What happens during an annual well check? The services and screenings done by your health care provider during your annual well check will depend on your age, overall health, lifestyle risk factors, and family  history of disease. Counseling  Your health care provider may ask you questions about your: Alcohol use. Tobacco use. Drug use. Emotional well-being. Home and relationship well-being. Sexual activity. Eating habits. History of falls. Memory and ability to understand (cognition). Work and work Statistician. Reproductive health. Screening  You may have the following tests or measurements: Height, weight, and BMI. Blood pressure. Lipid and cholesterol levels. These may be checked every 5 years, or more frequently if you are over 16 years old. Skin check. Lung cancer screening. You may have this screening every year starting at age 56 if you have a 30-pack-year history of smoking and currently smoke or have quit within the past 15 years. Fecal occult blood test (FOBT) of the stool. You may have this test every year starting at age 86. Flexible sigmoidoscopy or colonoscopy. You may have a sigmoidoscopy every 5 years or a colonoscopy every 10 years starting at age 62. Hepatitis C blood test. Hepatitis B blood test. Sexually transmitted disease (STD) testing. Diabetes screening. This is done by checking your blood sugar (glucose) after you have not eaten for a while (fasting). You may have this done every 1-3 years. Bone density scan. This is done to screen for osteoporosis. You may have this done starting at age 43. Mammogram. This may be done every 1-2 years. Talk to your health care provider about how often you should have regular mammograms. Talk with your health care provider about your test results,  treatment options, and if necessary, the need for more tests. Vaccines  Your health care provider may recommend certain vaccines, such as: Influenza vaccine. This is recommended every year. Tetanus, diphtheria, and acellular pertussis (Tdap, Td) vaccine. You may need a Td booster every 10 years. Zoster vaccine. You may need this after age 17. Pneumococcal 13-valent conjugate (PCV13)  vaccine. One dose is recommended after age 65. Pneumococcal polysaccharide (PPSV23) vaccine. One dose is recommended after age 20. Talk to your health care provider about which screenings and vaccines you need and how often you need them. This information is not intended to replace advice given to you by your health care provider. Make sure you discuss any questions you have with your health care provider. Document Released: 08/08/2015 Document Revised: 03/31/2016 Document Reviewed: 05/13/2015 Elsevier Interactive Patient Education  2017 [place] Prevention in the Home Falls can cause injuries. They can happen to people of all ages. There are many things you can do to make your home safe and to help prevent falls. What can I do on the outside of my home? Regularly fix the edges of walkways and driveways and fix any cracks. Remove anything that might make you trip as you walk through a door, such as a raised step or threshold. Trim any bushes or trees on the path to your home. Use bright outdoor lighting. Clear any walking paths of anything that might make someone trip, such as rocks or tools. Regularly check to see if handrails are loose or broken. Make sure that both sides of any steps have handrails. Any raised decks and porches should have guardrails on the edges. Have any leaves, snow, or ice cleared regularly. Use sand or salt on walking paths during winter. Clean up any spills in your garage right away. This includes oil or grease spills. What can I do in the bathroom? Use night lights. Install grab bars by the toilet and in the tub and shower. Do not use towel bars as grab bars. Use non-skid mats or decals in the tub or shower. If you need to sit down in the shower, use a plastic, non-slip stool. Keep the floor dry. Clean up any water that spills on the floor as soon as it happens. Remove soap buildup in the tub or shower regularly. Attach bath mats securely with  double-sided non-slip rug tape. Do not have throw rugs and other things on the floor that can make you trip. What can I do in the bedroom? Use night lights. Make sure that you have a light by your bed that is easy to reach. Do not use any sheets or blankets that are too big for your bed. They should not hang down onto the floor. Have a firm chair that has side arms. You can use this for support while you get dressed. Do not have throw rugs and other things on the floor that can make you trip. What can I do in the kitchen? Clean up any spills right away. Avoid walking on wet floors. Keep items that you use a lot in easy-to-reach places. If you need to reach something above you, use a strong step stool that has a grab bar. Keep electrical cords out of the way. Do not use floor polish or wax that makes floors slippery. If you must use wax, use non-skid floor wax. Do not have throw rugs and other things on the floor that can make you trip. What can I do with my stairs? Do  not leave any items on the stairs. Make sure that there are handrails on both sides of the stairs and use them. Fix handrails that are broken or loose. Make sure that handrails are as long as the stairways. Check any carpeting to make sure that it is firmly attached to the stairs. Fix any carpet that is loose or worn. Avoid having throw rugs at the top or bottom of the stairs. If you do have throw rugs, attach them to the floor with carpet tape. Make sure that you have a light switch at the top of the stairs and the bottom of the stairs. If you do not have them, ask someone to add them for you. What else can I do to help prevent falls? Wear shoes that: Do not have high heels. Have rubber bottoms. Are comfortable and fit you well. Are closed at the toe. Do not wear sandals. If you use a stepladder: Make sure that it is fully opened. Do not climb a closed stepladder. Make sure that both sides of the stepladder are locked  into place. Ask someone to hold it for you, if possible. Clearly mark and make sure that you can see: Any grab bars or handrails. First and last steps. Where the edge of each step is. Use tools that help you move around (mobility aids) if they are needed. These include: Canes. Walkers. Scooters. Crutches. Turn on the lights when you go into a dark area. Replace any light bulbs as soon as they burn out. Set up your furniture so you have a clear path. Avoid moving your furniture around. If any of your floors are uneven, fix them. If there are any pets around you, be aware of where they are. Review your medicines with your doctor. Some medicines can make you feel dizzy. This can increase your chance of falling. Ask your doctor what other things that you can do to help prevent falls. This information is not intended to replace advice given to you by your health care provider. Make sure you discuss any questions you have with your health care provider. Document Released: 05/08/2009 Document Revised: 12/18/2015 Document Reviewed: 08/16/2014 Elsevier Interactive Patient Education  2017 [employer].

## 2022-09-29 NOTE — Progress Notes (Signed)
I connected with  Mandy Castillo on 09/29/22 by a audio enabled telemedicine application and verified that I am speaking with the correct person using two identifiers.  Patient Location: Home  Provider Location: Office/Clinic  I discussed the limitations of evaluation and management by telemedicine. The patient expressed understanding and agreed to proceed.  Subjective:   Mandy Castillo is a 82 y.o. female who presents for Medicare Annual (Subsequent) preventive examination.  Review of Systems     Cardiac Risk Factors include: advanced age (>9mn, >>85women);hypertension     Objective:    Today's Vitals   09/29/22 0914  Weight: 170 lb (77.1 kg)  Height: '5\' 8"'$  (1.727 m)   Body mass index is 25.85 kg/m.     09/29/2022    9:26 AM 04/20/2022   10:14 AM 09/15/2021   11:50 AM  Advanced Directives  Does Patient Have a Medical Advance Directive? No No No  Would patient like information on creating a medical advance directive?  No - Patient declined Yes (MAU/Ambulatory/Procedural Areas - Information given)    Current Medications (verified) Outpatient Encounter Medications as of 09/29/2022  Medication Sig   aspirin EC 325 MG tablet Take 1 tablet (325 mg total) by mouth 2 (two) times daily after a meal. Take x 1 month post op to decrease risk of blood clots.   Cholecalciferol (D3 PO) Take 1 tablet by mouth daily.   Cyanocobalamin (B-12 PO) Take 1 tablet by mouth daily.   docusate sodium (COLACE) 100 MG capsule Take 1 capsule (100 mg total) by mouth 2 (two) times daily.   donepezil (ARICEPT) 10 MG tablet Take 10 mg by mouth at bedtime.   HYDROcodone-acetaminophen (NORCO) 5-325 MG tablet Take 1-2 tablets by mouth every 6 (six) hours as needed for moderate pain.   metoprolol succinate (TOPROL-XL) 50 MG 24 hr tablet Take 1 tablet (50 mg total) by mouth daily.   omeprazole (PRILOSEC) 20 MG capsule TAKE 1 CAPSULE BY MOUTH EVERY DAY   tiZANidine (ZANAFLEX) 2 MG tablet Take 1 tablet (2 mg  total) by mouth every 8 (eight) hours as needed for muscle spasms.   celecoxib (CELEBREX) 100 MG capsule Take 100 mg by mouth 2 (two) times daily. (Patient not taking: Reported on 09/29/2022)   No facility-administered encounter medications on file as of 09/29/2022.    Allergies (verified) Patient has no known allergies.   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   Procedure: TOTAL KNEE ARTHROPLASTY;  Surgeon: GDorna Leitz MD;  Location: [facility];  Service: Orthopedics;  Laterality: Left;   Family History  Problem Relation Age of Onset   Stroke Mother 975  Prostate cancer Father    Social History   Socioeconomic History   Marital status: Single    Spouse name: Not on file   Number of children: 0   Years of education: master's degree   Highest education level: Not on file  Occupational History   Not on file  Tobacco Use   Smoking status: Never   Smokeless tobacco: Never  Vaping Use   Vaping Use: Never used  Substance and Sexual Activity   Alcohol use: Yes    Comment: wine every few months   Drug use: Never   Sexual activity: Not Currently  Other Topics Concern   Not on file  Social History Narrative   04/21/20   From: [place], [place]   Living: with partner - Tollie Pizza - 2011   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, occupational and international relations   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        Family: near [place] - cousin and sister in [place]       Enjoys: reading, writing      Exercise: PT currently for knees   Diet: trying to eat healthy      Safety    Seat belts: Yes    Guns: No   Safe in relationships: Yes    Social Determinants of Radio broadcast assistant Strain: [place]  (09/29/2022)   Overall Financial Resource Strain (CARDIA)    Difficulty of Paying Living Expenses: Not hard at all  Food Insecurity: No Food Insecurity (09/29/2022)   Hunger Vital Sign    Worried About Running Out of Food in the Last Year: Never true    [place] in the Last Year: Never true  Transportation Needs: No Transportation Needs (09/29/2022)   PRAPARE - Hydrologist (Medical): No    Lack of Transportation (Non-Medical): No  Physical Activity: Inactive (09/29/2022)   Exercise Vital Sign    Days of Exercise per Week: 0 days    Minutes of Exercise per Session: 0 min  Stress: No Stress Concern Present (09/29/2022)   [place]    Feeling of Stress : Not at all  Social Connections: Moderately Isolated (09/29/2022)   Social Connection and Isolation Panel [NHANES]    Frequency of Communication with Friends and Family: More than three times a week    Frequency of Social Gatherings with Friends and Family: More than three times a week    Attends Religious Services: Never    Marine scientist or Organizations: No    Attends Music therapist: Never    Marital Status: Living with partner    Tobacco Counseling Counseling given: Not Answered   Clinical Intake:  Pre-visit preparation completed: Yes  Pain : No/denies pain     BMI - recorded: 25.85 Nutritional Status: BMI 25 -29 Overweight Nutritional Risks: None Diabetes: No  How often do you need to have someone help you when you read instructions, pamphlets, or other written materials from your doctor or pharmacy?: 1 - Never  Diabetic?no  Interpreter Needed?: No  Information entered by :: B.Letishia Elliott,LPN   Activities of Daily Living    09/29/2022    9:27 AM 04/20/2022   10:09 AM   In your present state of health, do you have any difficulty performing the following activities:  Hearing? 1 0  Vision? 1 0  Difficulty concentrating or making decisions? 1 1  Comment  Having some memory issues, followed by neurology  Walking or climbing stairs? 1 0  Dressing or bathing? 0 0  Doing errands, shopping? 1 0  Comment needs help: has partner   Conservation officer, nature and eating ? N   Using the Toilet? N   In the past six months, have you accidently leaked urine? N  Do you have problems with loss of bowel control? N   Managing your Medications? N   Managing your Finances? N   Housekeeping or managing your Housekeeping? N     Patient Care Team: Waunita Schooner, MD as PCP - General (Family Medicine) Troy Sine, MD as PCP - Cardiology (Cardiology) Anabel Bene, MD as Referring Physician (Neurology) Dorna Leitz, MD as Consulting Physician (Orthopedic Surgery)  Indicate any recent Medical Services you may have received from other than [facility] providers in the past year (date may be approximate).     Assessment:   This is a routine wellness examination for Gar[place]di.  Hearing/Vision screen Hearing Screening - Comments:: Adequate hearing Vision Screening - Comments:: Adequate vision w/glasses Needs eye exam  Dietary issues and exercise activities discussed: Exercise limited by: orthopedic condition(s);Other - see comments (cognitive)   Goals Addressed   None    Depression Screen    09/29/2022    9:22 AM 07/24/2020    9:56 AM 04/21/2020    2:37 PM  PHQ 2/9 Scores  PHQ - 2 Score 0 0 0    Fall Risk    09/29/2022    9:19 AM 09/15/2021   11:39 AM 07/24/2020    9:56 AM  Fall Risk   Falls in the past year? 1 1 0  Number falls in past yr: 1 1 0  Injury with Fall? 0 1 0  Comment  Pt states she hurt her back   Risk for fall due to : No Fall Risks History of fall(s)   Follow up   Falls evaluation completed    [place]:  Any stairs  in or around the home? No  If so, are there any without handrails? No  Home free of loose throw rugs in walkways, pet beds, electrical cords, etc? Yes  Adequate lighting in your home to reduce risk of falls? Yes   ASSISTIVE DEVICES UTILIZED TO PREVENT FALLS:  Life alert? No  Use of a cane, walker or w/c? No  Grab bars in the bathroom? Yes  Shower chair or bench in shower? No  Elevated toilet seat or a handicapped toilet? Yes     Cognitive Function:      09/01/2020   10:34 AM  Montreal Cognitive Assessment   Visuospatial/ Executive (0/5) 3  Naming (0/3) 1  Attention: Read list of digits (0/2) 2  Attention: Read list of letters (0/1) 1  Attention: Serial 7 subtraction starting at 100 (0/3) 2  Language: Repeat phrase (0/2) 1  Language : Fluency (0/1) 1  Abstraction (0/2) 2  Delayed Recall (0/5) 1  Orientation (0/6) 6  Total 20      09/29/2022    9:32 AM  6CIT Screen  What Year? 0 points  What month? 0 points  What time? 0 points  Count back from 20 0 points  Months in reverse 4 points  Repeat phrase 2 points  Total Score 6 points    Immunizations Immunization History  Administered Date(s) Administered   Fluad Quad(high Dose 65+) 07/24/2020   Influenza, High Dose Seasonal PF 05/07/2021   PFIZER(Purple Top)SARS-COV-2 Vaccination 09/06/2019, 10/01/2019, 05/14/2020, 12/11/2020   Pfizer Covid-19 Vaccine Bivalent Booster 5yr & up 05/07/2021   Tdap 10/06/2021    TDAP status: Up to date  Flu Vaccine status: Up to date  Pneumococcal vaccine status: Due, Education has been provided regarding the importance of this vaccine. Advised may receive this vaccine at local pharmacy or  Health Dept. Aware to provide a copy of the vaccination record if obtained from local pharmacy or Health Dept. Verbalized acceptance and understanding.  Covid-19 vaccine status: Completed vaccines  Qualifies for Shingles Vaccine? Yes   Zostavax completed No   Shingrix Completed?: No.     Education has been provided regarding the importance of this vaccine. Patient has been advised to call insurance company to determine out of pocket expense if they have not yet received this vaccine. Advised may also receive vaccine at local pharmacy or Health Dept. Verbalized acceptance and understanding.  Screening Tests Health Maintenance  Topic Date Due   Zoster Vaccines- Shingrix (1 of 2) Never done   Pneumonia Vaccine 34+ Years old (1 of 1 - PCV) Never done   DEXA SCAN  Never done   INFLUENZA VACCINE  02/23/2022   COVID-19 Vaccine (6 - 2023-24 season) 03/26/2022   Medicare Annual Wellness (AWV)  09/29/2023   DTaP/Tdap/Td (2 - Td or Tdap) 10/07/2031   HPV VACCINES  Aged Out    Health Maintenance  Health Maintenance Due  Topic Date Due   Zoster Vaccines- Shingrix (1 of 2) Never done   Pneumonia Vaccine 52+ Years old (1 of 1 - PCV) Never done   DEXA SCAN  Never done   INFLUENZA VACCINE  02/23/2022   COVID-19 Vaccine (6 - 2023-24 season) 03/26/2022    Colorectal cancer screening: No longer required.   Mammogram status: No longer required due to age.   Lung Cancer Screening: (Low Dose CT Chest recommended if Age 28-80 years, 30 pack-year currently smoking OR have quit w/in 15years.) does not qualify.   Lung Cancer Screening Referral: no  Additional Screening:  Hepatitis C Screening: does not qualify; Completed no  Vision Screening: Recommended annual ophthalmology exams for early detection of glaucoma and other disorders of the eye. Is the patient up to date with their annual eye exam?  No  Who is the provider or what is the name of the office in which the patient attends annual eye exams? Does not have one-they retired If pt is not established with a provider, would they like to be referred to a provider to establish care? yes.   Dental Screening: Recommended annual dental exams for proper oral hygiene  Community Resource Referral / Chronic Care Management: CRR  required this visit?  No   CCM required this visit?  No      Plan:     I have personally reviewed and noted the following in the patient's chart:   Medical and social history Use of alcohol, tobacco or illicit drugs  Current medications and supplements including opioid prescriptions. Patient is currently taking opioid prescriptions. Information provided to patient regarding non-opioid alternatives. Patient advised to discuss non-opioid treatment plan with their provider. Functional ability and status Nutritional status Physical activity Advanced directives List of other physicians Hospitalizations, surgeries, and ER visits in previous 12 months Vitals Screenings to include cognitive, depression, and falls Referrals and appointments  In addition, I have reviewed and discussed with patient certain preventive protocols, quality metrics, and best practice recommendations. A written personalized care plan for preventive services as well as general preventive health recommendations were provided to patient.     Roger Shelter, LPN   [id number]   Nurse Notes: pt is doing well she has no concerns or questions.   *She relays she needs an eye exam but her eye doctor retired. Requests referral.*

## 2022-09-30 ENCOUNTER — Telehealth: Payer: Self-pay | Admitting: Family Medicine

## 2022-09-30 DIAGNOSIS — Z0100 Encounter for examination of eyes and vision without abnormal findings: Secondary | ICD-10-CM

## 2022-09-30 NOTE — Telephone Encounter (Signed)
I don't see where the referral was ordered by you. Do you know if the eye condition would be Pseudophakia, right eye?

## 2022-09-30 NOTE — Telephone Encounter (Signed)
[facility] called regarding a referral sent to them from [place] stated from [place] comments, a screening was needed for an eye condition. Office is asking what type of condition does the pt's eyes have? Office state they need to know before they can scheduled pt. Call back # [phone]

## 2022-10-06 ENCOUNTER — Encounter: Payer: Medicare Other | Admitting: Family

## 2022-10-07 NOTE — Telephone Encounter (Signed)
Thanks for the response. I updated referral to optometry.

## 2022-10-07 NOTE — Telephone Encounter (Signed)
Referral sent - see referral notes

## 2022-10-07 NOTE — Addendum Note (Signed)
Addended by: Eugenia Pancoast on: 10/07/2022 01:10 PM   Modules accepted: Orders

## 2022-10-12 ENCOUNTER — Ambulatory Visit (HOSPITAL_COMMUNITY): Payer: Medicare Other | Attending: Internal Medicine

## 2022-10-12 DIAGNOSIS — I10 Essential (primary) hypertension: Secondary | ICD-10-CM | POA: Insufficient documentation

## 2022-10-12 LAB — ECHOCARDIOGRAM COMPLETE
Area-P 1/2: 3.39 cm2
S' Lateral: 2.2 cm

## 2022-10-21 ENCOUNTER — Telehealth: Payer: Self-pay

## 2022-10-21 NOTE — Telephone Encounter (Addendum)
Patient returned call regarding results. Patient had understanding of results----- Message from Lendon Colonel, NP sent at 10/13/2022  4:28 PM EDT ----- I have reviewed her echo report. No significant changes compared to previous echo. Normal heart pumping function with a little bit of mitral valve backflow. This is not concerning at present.  KL

## 2022-10-21 NOTE — Telephone Encounter (Addendum)
Called patient regarding results. Unable to leave message . Letter mailed 10/21/2022----- Message from Lendon Colonel, NP sent at 10/13/2022  4:28 PM EDT ----- I have reviewed her echo report. No significant changes compared to previous echo. Normal heart pumping function with a little bit of mitral valve backflow. This is not concerning at present.  KL

## 2022-10-29 ENCOUNTER — Telehealth: Payer: Self-pay

## 2022-10-29 NOTE — Telephone Encounter (Signed)
I spoke with Mandy Castillo at [facility] orthopedics after received a med clearance form for right total knee arthroplasty. Pt last saw Dr Selena Batten 01/2022 when a med clearance done for knee replacement. Pt has TOC with T Dugal FNP 01/03/23. [name] is not sure when or if pt is going to have knee replacement; [name] said the medical clearance is good for 60 days. [name] said she will contact pt next week and discuss when or if pt wants to have knee replacement and [name] will resend medical clearance with note of estimated date for surgery so med clearance can be done in 60 day time frame.

## 2022-11-11 DIAGNOSIS — M25562 Pain in left knee: Secondary | ICD-10-CM | POA: Diagnosis not present

## 2022-11-11 DIAGNOSIS — M25561 Pain in right knee: Secondary | ICD-10-CM | POA: Diagnosis not present

## 2022-11-11 DIAGNOSIS — M1711 Unilateral primary osteoarthritis, right knee: Secondary | ICD-10-CM | POA: Diagnosis not present

## 2022-11-16 ENCOUNTER — Ambulatory Visit: Payer: Medicare Other | Admitting: Family

## 2022-11-16 ENCOUNTER — Ambulatory Visit (INDEPENDENT_AMBULATORY_CARE_PROVIDER_SITE_OTHER): Payer: Medicare Other | Admitting: Nurse Practitioner

## 2022-11-16 ENCOUNTER — Encounter: Payer: Self-pay | Admitting: Nurse Practitioner

## 2022-11-16 VITALS — BP 138/78 | HR 67 | Temp 97.5°F | Resp 16 | Ht 68.0 in | Wt 172.1 lb

## 2022-11-16 DIAGNOSIS — Z01818 Encounter for other preprocedural examination: Secondary | ICD-10-CM | POA: Diagnosis not present

## 2022-11-16 DIAGNOSIS — I10 Essential (primary) hypertension: Secondary | ICD-10-CM | POA: Diagnosis not present

## 2022-11-16 DIAGNOSIS — I4892 Unspecified atrial flutter: Secondary | ICD-10-CM | POA: Diagnosis not present

## 2022-11-16 LAB — COMPREHENSIVE METABOLIC PANEL
ALT: 11 U/L (ref 0–35)
AST: 16 U/L (ref 0–37)
Albumin: 4.2 g/dL (ref 3.5–5.2)
Alkaline Phosphatase: 74 U/L (ref 39–117)
BUN: 18 mg/dL (ref 6–23)
CO2: 32 mEq/L (ref 19–32)
Calcium: 9.6 mg/dL (ref 8.4–10.5)
Chloride: 103 mEq/L (ref 96–112)
Creatinine, Ser: 0.79 mg/dL (ref 0.40–1.20)
GFR: 68.75 mL/min (ref >60.00)
Glucose, Bld: 77 mg/dL (ref 70–99)
Potassium: 4.3 mEq/L (ref 3.5–5.1)
Sodium: 141 mEq/L (ref 135–145)
Total Bilirubin: 0.6 mg/dL (ref 0.2–1.2)
Total Protein: 6.8 g/dL (ref 6.0–8.3)

## 2022-11-16 LAB — CBC
HCT: 39.5 % (ref 36.0–46.0)
Hemoglobin: 13.1 g/dL (ref 12.0–15.0)
MCHC: 33 g/dL (ref 30.0–36.0)
MCV: 87.3 fl (ref 78.0–100.0)
Platelets: 338 10*3/uL (ref 150.0–400.0)
RBC: 4.53 Mil/uL (ref 3.87–5.11)
RDW: 14.9 % (ref 11.5–15.5)
WBC: 5.3 10*3/uL (ref 4.0–10.5)

## 2022-11-16 NOTE — Patient Instructions (Signed)
Nice to see you today  As long as the labs look good I will clear you for the knee surgery Keep your appointment with Tabitha as scheduled

## 2022-11-16 NOTE — Assessment & Plan Note (Signed)
Normal sinus rhythm today.  Patient is maintained on a baby aspirin.  She has been cleared preoperatively through cardiology

## 2022-11-16 NOTE — Assessment & Plan Note (Signed)
Patient is high risk given age and cardiac history.  Has been cleared through cardiology.  Will clear her medically through primary care's standpoint.  Will fill out form faxed to Dr. Stacy Gardner office and send of office note.  Will also obtain CBC and CMP in case this is needed.  Did discuss with patient office did review [employer] of surgeons surgical risk calculator data will attach to forms being faxed to orthopedic surgeons office.

## 2022-11-16 NOTE — Progress Notes (Signed)
Acute Office Visit  Subjective:     Patient ID: Mandy Castillo, female    DOB: 1941/04/25, 82 y.o.   MRN: [id number]  Chief Complaint  Patient presents with   Pre-op Exam    HPI Patient is in today for pre-operative clearance for a right knee arthorplasty by Dr graves  Patient was seen by cardiology on 09/03/2022 and was cleared with a higher risk from a cardiac stand point. She was a patinet of Gweneth Dimitri and last seen in office on 02/09/2022.  States that she has a total knee on the left side 04/2022 and did well   States that she does not have a problem with anesthesia or getting waken up. States that she doe snot have post operative nausea or vomiting.  No Fh or personal history of milaginnat hyperthermia   States that she does live at home with a partner.  States that she ambulates independent. States that she can do all her ADLs and iADLs.   States that she still has her license but does not drive currently because she does not have a car  States that she does not have a date for her surgery yet. States that they are leaving when the surgery is scheduled up to her.   Review of Systems  Constitutional:  Negative for chills and fever.  Respiratory:  Negative for shortness of breath.   Cardiovascular:  Positive for leg swelling. Negative for chest pain.        Objective:    BP 138/78   Pulse 67   Temp (!) 97.5 F (36.4 C)   Resp 16   Ht  (1.727 m)   Wt 172 lb 2 oz (78.1 kg)   SpO2 99%   BMI 26.17 kg/m    Physical Exam Vitals and nursing note reviewed.  Constitutional:      Appearance: Normal appearance.  Cardiovascular:     Rate and Rhythm: Normal rate and regular rhythm.     Heart sounds: Normal heart sounds.  Pulmonary:     Effort: Pulmonary effort is normal.     Breath sounds: Normal breath sounds.  Musculoskeletal:     Right lower leg: Edema present.     Left lower leg: Edema present.  Neurological:     General: No focal deficit present.      Mental Status: She is alert.     Deep Tendon Reflexes:     Reflex Scores:      Bicep reflexes are 2+ on the right side and 2+ on the left side.      Patellar reflexes are 2+ on the right side and 1+ on the left side.    Comments: Bilateral upper and lower extremity strength 5/5     Results for orders placed or performed in visit on 11/16/22  CBC  Result Value Ref Range   WBC 5.3 4.0 - 10.5 K/uL   RBC 4.53 3.87 - 5.11 Mil/uL   Platelets 338.0 150.0 - 400.0 K/uL   Hemoglobin 13.1 12.0 - 15.0 g/dL   HCT 40.9 81.1 - 91.4 %   MCV 87.3 78.0 - 100.0 fl   MCHC 33.0 30.0 - 36.0 g/dL   RDW 78.2 95.6 - 21.3 %  Comprehensive metabolic panel  Result Value Ref Range   Sodium 141 135 - 145 mEq/L   Potassium 4.3 3.5 - 5.1 mEq/L   Chloride 103 96 - 112 mEq/L   CO2 32 19 - 32 mEq/L   Glucose, Bld  77 70 - 99 mg/dL   BUN 18 6 - 23 mg/dL   Creatinine, Ser 2.95 0.40 - 1.20 mg/dL   Total Bilirubin 0.6 0.2 - 1.2 mg/dL   Alkaline Phosphatase 74 39 - 117 U/L   AST 16 0 - 37 U/L   ALT 11 0 - 35 U/L   Total Protein 6.8 6.0 - 8.3 g/dL   Albumin 4.2 3.5 - 5.2 g/dL   GFR 28.41 >32.44 mL/min   Calcium 9.6 8.4 - 10.5 mg/dL        Assessment & Plan:   Problem List Items Addressed This Visit       Cardiovascular and Mediastinum   Atrial flutter    Normal sinus rhythm today.  Patient is maintained on a baby aspirin.  She has been cleared preoperatively through cardiology      Essential hypertension    Patient's blood pressure under good control.  She has seen cardiology and been cleared by them      Relevant Orders   CBC (Completed)   Comprehensive metabolic panel (Completed)     Other   Pre-operative clearance - Primary    Patient is high risk given age and cardiac history.  Has been cleared through cardiology.  Will clear her medically through primary care's standpoint.  Will fill out form faxed to Dr. Stacy Gardner office and send of office note.  Will also obtain CBC and CMP in case this  is needed.  Did discuss with patient office did review [employer] of surgeons surgical risk calculator data will attach to forms being faxed to orthopedic surgeons office.      Relevant Orders   CBC (Completed)   Comprehensive metabolic panel (Completed)    No orders of the defined types were placed in this encounter.   Return if symptoms worsen or fail to improve, for As scheduled for TOC with Tabitha .  Audria Nine, NP

## 2022-11-16 NOTE — Assessment & Plan Note (Signed)
Patient's blood pressure under good control.  She has seen cardiology and been cleared by them

## 2022-11-22 ENCOUNTER — Other Ambulatory Visit: Payer: Self-pay

## 2022-11-22 MED ORDER — METOPROLOL SUCCINATE ER 50 MG PO TB24: 50.0000 mg | ORAL_TABLET | Freq: Every day | ORAL | 3 refills | Status: DC

## 2022-11-25 DIAGNOSIS — H5203 Hypermetropia, bilateral: Secondary | ICD-10-CM | POA: Diagnosis not present

## 2022-11-25 DIAGNOSIS — H25812 Combined forms of age-related cataract, left eye: Secondary | ICD-10-CM | POA: Diagnosis not present

## 2022-11-25 DIAGNOSIS — H52203 Unspecified astigmatism, bilateral: Secondary | ICD-10-CM | POA: Diagnosis not present

## 2022-12-02 ENCOUNTER — Telehealth: Payer: Self-pay | Admitting: *Deleted

## 2022-12-02 NOTE — Telephone Encounter (Signed)
   Pre-operative Risk Assessment    Patient Name: Mandy Castillo  DOB: [birth year] MRN: [id number]      Request for Surgical Clearance    Procedure:   Right Total Knee Arthroplasty  Date of Surgery:  Clearance TBD                                 Surgeon:  Dr. Milly Jakob Surgeon's Group or Practice Name:  [facility] Phone number:  [phone] Fax number:  [phone]   Type of Clearance Requested:   - Medical  - Pharmacy:  Hold Aspirin Not Indicated   Type of Anesthesia:  Spinal   Additional requests/questions:    Signed, Emmit Pomfret   12/02/2022, 3:53 PM

## 2022-12-02 NOTE — Telephone Encounter (Signed)
Left message to call back to pre op team to set up tele pre op.

## 2022-12-02 NOTE — Telephone Encounter (Signed)
   Name: Mandy Castillo  DOB: June 14, 1941  MRN: [id number]  Primary Cardiologist: Nicki Guadalajara, MD  Chart reviewed as part of pre-operative protocol coverage. Because of [name]'s past medical history and time since last visit, she will require a follow-up telephone visit in order to better assess preoperative cardiovascular risk.  Pre-op covering staff: - Please schedule appointment and call patient to inform them. If patient already had an upcoming appointment within acceptable timeframe, please add "pre-op clearance" to the appointment notes so provider is aware. - Please contact requesting surgeon's office via preferred method (i.e, phone, fax) to inform them of need for appointment prior to surgery.  Cleared back in February with the following note:  1.  Preoperative cardiac assessment: Able to complete >4.0 METS. Per Revised Cardiac Risk Index, consider high risk with >11% chance of adverse cardiac events perioperatively given history of CAD, CHF, and stroke. She is optimized from a cardiac standpoint. Patient is aware that he is at elevated risk and accepts this. Therefore, based on ACC/AHA guidelines, patient would be at acceptable risk for the planned procedure without further cardiovascular testing.   If nothing has changed from cardiac standpoint, should be a quick phone call visit.  Sharlene Dory, PA-C  12/02/2022, 4:33 PM

## 2022-12-03 ENCOUNTER — Telehealth: Payer: Self-pay | Admitting: *Deleted

## 2022-12-03 NOTE — Telephone Encounter (Signed)
Pt has been scheduled for tele pre op appt 12/09/22 @ 9:40. Med rec and consent are done.     Patient Consent for Virtual Visit        Danara Bigley has provided verbal consent on 12/03/2022 for a virtual visit (video or telephone).   CONSENT FOR VIRTUAL VISIT FOR:  [name]  By participating in this virtual visit I agree to the following:  I hereby voluntarily request, consent and authorize [facility] and its employed or contracted physicians, physician assistants, nurse practitioners or other licensed health care professionals (the Practitioner), to provide me with telemedicine health care services (the "Services") as deemed necessary by the treating Practitioner. I acknowledge and consent to receive the Services by the Practitioner via telemedicine. I understand that the telemedicine visit will involve communicating with the Practitioner through live audiovisual communication technology and the disclosure of certain medical information by electronic transmission. I acknowledge that I have been given the opportunity to request an in-person assessment or other available alternative prior to the telemedicine visit and am voluntarily participating in the telemedicine visit.  I understand that I have the right to withhold or withdraw my consent to the use of telemedicine in the course of my care at any time, without affecting my right to future care or treatment, and that the Practitioner or I may terminate the telemedicine visit at any time. I understand that I have the right to inspect all information obtained and/or recorded in the course of the telemedicine visit and may receive copies of available information for a reasonable fee.  I understand that some of the potential risks of receiving the Services via telemedicine include:  Delay or interruption in medical evaluation due to technological equipment failure or disruption; Information transmitted may not be sufficient (e.g. poor  resolution of images) to allow for appropriate medical decision making by the Practitioner; and/or  In rare instances, security protocols could fail, causing a breach of personal health information.  Furthermore, I acknowledge that it is my responsibility to provide information about my medical history, conditions and care that is complete and accurate to the best of my ability. I acknowledge that Practitioner's advice, recommendations, and/or decision may be based on factors not within their control, such as incomplete or inaccurate data provided by me or distortions of diagnostic images or specimens that may result from electronic transmissions. I understand that the practice of medicine is not an exact science and that Practitioner makes no warranties or guarantees regarding treatment outcomes. I acknowledge that a copy of this consent can be made available to me via my patient portal [facility] MyChart), or I can request a printed copy by calling the office of [facility].    I understand that my insurance will be billed for this visit.   I have read or had this consent read to me. I understand the contents of this consent, which adequately explains the benefits and risks of the Services being provided via telemedicine.  I have been provided ample opportunity to ask questions regarding this consent and the Services and have had my questions answered to my satisfaction. I give my informed consent for the services to be provided through the use of telemedicine in my medical care

## 2022-12-03 NOTE — Telephone Encounter (Signed)
Pt has been scheduled for tele pre op appt 12/09/22 @ 9:40. Med rec and consent are done.

## 2022-12-08 NOTE — Progress Notes (Unsigned)
Virtual Visit via Telephone Note   Because of Aram Ciampa's co-morbid illnesses, she is at least at moderate risk for complications without adequate follow up.  This format is felt to be most appropriate for this patient at this time.  The patient did not have access to video technology/had technical difficulties with video requiring transitioning to audio format only (telephone).  All issues noted in this document were discussed and addressed.  No physical exam could be performed with this format.  Please refer to the patient's chart for Mandy Castillo consent to telehealth for [facility].  Evaluation Performed:  Preoperative cardiovascular risk assessment _____________   Date:  12/08/2022   Patient ID:  Mandy Castillo, DOB [birth year], MRN [id number] Patient Location:  Home Provider location:   Office  Primary Care Provider:  Pcp, No Primary Cardiologist:  Nicki Guadalajara, MD  Chief Complaint / Patient Profile   82 y.o. y/o female with a h/o SVT, HTN, GERD, progressive memory deficits who is pending right total knee arthroplasty and presents today for telephonic preoperative cardiovascular risk assessment.  History of Present Illness    Mandy Castillo is a 82 y.o. female who presents via audio/video conferencing for a telehealth visit today.  Pt was last seen in cardiology clinic on 09/03/2022 by Joni Reining, NP.  At that time Canon Oriordan was doing well and underwent TTE 10/12/22 that revealed normal LV function with mild mitral valve prolapse and regurgitation.  The patient is now pending procedure as outlined above. Since Mandy Castillo last visit, she  denies chest pain, shortness of breath, lower extremity edema, fatigue, palpitations, melena, hematuria, hemoptysis, diaphoresis, weakness, presyncope, syncope, orthopnea, and PND. She is able to achieve > 4 METS activity with regular house work, walking/running without concerning cardiac symptoms.    Past Medical History    Past Medical  History:  Diagnosis Date   Arthritis    GERD (gastroesophageal reflux disease)    Hypertension    Sebaceous cyst 07/24/2020   Past Surgical History:  Procedure Laterality Date   ABDOMINAL HYSTERECTOMY     APPENDECTOMY     BREAST SURGERY     cyst removed   CHOLECYSTECTOMY     CYST REMOVAL NECK     TOTAL KNEE ARTHROPLASTY Left 05/03/2022   Procedure: TOTAL KNEE ARTHROPLASTY;  Surgeon: Jodi Geralds, MD;  Location: [facility];  Service: Orthopedics;  Laterality: Left;    Allergies  No Known Allergies  Home Medications    Prior to Admission medications   Medication Sig Start Date End Date Taking? Authorizing Provider  aspirin EC 325 MG tablet Take 1 tablet (325 mg total) by mouth 2 (two) times daily after a meal. Take x 1 month post op to decrease risk of blood clots. 05/03/22   Marshia Ly, PA-C  celecoxib (CELEBREX) 100 MG capsule Take 100 mg by mouth 2 (two) times daily. 05/03/22   [provider]  Cholecalciferol (D3 PO) Take 1 tablet by mouth daily.    [provider]  Cyanocobalamin (B-12 PO) Take 1 tablet by mouth daily.    [provider]  docusate sodium (COLACE) 100 MG capsule Take 1 capsule (100 mg total) by mouth 2 (two) times daily. 05/03/22   Marshia Ly, PA-C  donepezil (ARICEPT) 10 MG tablet Take 10 mg by mouth at bedtime. 04/23/21   [provider]  HYDROcodone-acetaminophen (NORCO) 5-325 MG tablet Take 1-2 tablets by mouth every 6 (six) hours as needed for moderate pain. 05/03/22   Marshia Ly, PA-C  metoprolol succinate (TOPROL-XL) 50 MG 24 hr tablet Take 1 tablet (50 mg total) by mouth daily. 11/22/22   Lennette Bihari, MD  omeprazole (PRILOSEC) 20 MG capsule TAKE 1 CAPSULE BY MOUTH EVERY DAY 02/11/22   Gweneth Dimitri, MD  tiZANidine (ZANAFLEX) 2 MG tablet Take 1 tablet (2 mg total) by mouth every 8 (eight) hours as needed for muscle spasms. 05/03/22   Marshia Ly, PA-C    Physical Exam    Vital Signs:  Mandy Castillo does not  have vital signs available for review today.  Given telephonic nature of communication, physical exam is limited. AAOx3. NAD. Normal affect.  Speech and respirations are unlabored.  Accessory Clinical Findings    None  Assessment & Plan    1.  Preoperative Cardiovascular Risk Assessment: According to the Revised Cardiac Risk Index (RCRI), Mandy Castillo Perioperative Risk of Major Cardiac Event is (%): 0.9. Mandy Castillo Functional Capacity in METs is: 7.04 according to the Duke Activity Status Index (DASI). The patient is doing well from a cardiac perspective. Therefore, based on ACC/AHA guidelines, the patient would be at acceptable risk for the planned procedure without further cardiovascular testing.   The patient was advised that if she develops new symptoms prior to surgery to contact our office to arrange for a follow-up visit, and she verbalized understanding.  No request to hold cardiac medications  A copy of this note will be routed to requesting surgeon.  Time:   Today, I have spent 8 minutes with the patient with telehealth technology discussing medical history, symptoms, and management plan.    Levi Aland, NP-C  12/09/2022, 9:24 AM [address] Office [phone] Fax [phone]

## 2022-12-09 ENCOUNTER — Ambulatory Visit: Payer: Medicare Other | Attending: Interventional Cardiology | Admitting: Nurse Practitioner

## 2022-12-09 ENCOUNTER — Encounter: Payer: Self-pay | Admitting: Nurse Practitioner

## 2022-12-09 DIAGNOSIS — Z01810 Encounter for preprocedural cardiovascular examination: Secondary | ICD-10-CM

## 2023-01-03 ENCOUNTER — Ambulatory Visit (INDEPENDENT_AMBULATORY_CARE_PROVIDER_SITE_OTHER): Payer: Medicare Other | Admitting: Family

## 2023-01-03 ENCOUNTER — Encounter: Payer: Self-pay | Admitting: Family

## 2023-01-03 ENCOUNTER — Other Ambulatory Visit: Payer: Self-pay | Admitting: Orthopedic Surgery

## 2023-01-03 ENCOUNTER — Encounter (HOSPITAL_COMMUNITY): Payer: Self-pay

## 2023-01-03 VITALS — BP 126/64 | HR 68 | Temp 97.6°F | Ht 68.0 in | Wt 172.6 lb

## 2023-01-03 DIAGNOSIS — Z8679 Personal history of other diseases of the circulatory system: Secondary | ICD-10-CM | POA: Insufficient documentation

## 2023-01-03 DIAGNOSIS — I10 Essential (primary) hypertension: Secondary | ICD-10-CM

## 2023-01-03 DIAGNOSIS — E538 Deficiency of other specified B group vitamins: Secondary | ICD-10-CM | POA: Insufficient documentation

## 2023-01-03 DIAGNOSIS — Z23 Encounter for immunization: Secondary | ICD-10-CM | POA: Diagnosis not present

## 2023-01-03 DIAGNOSIS — G3184 Mild cognitive impairment, so stated: Secondary | ICD-10-CM

## 2023-01-03 DIAGNOSIS — R600 Localized edema: Secondary | ICD-10-CM | POA: Insufficient documentation

## 2023-01-03 DIAGNOSIS — Z780 Asymptomatic menopausal state: Secondary | ICD-10-CM | POA: Diagnosis not present

## 2023-01-03 DIAGNOSIS — H2512 Age-related nuclear cataract, left eye: Secondary | ICD-10-CM | POA: Diagnosis not present

## 2023-01-03 DIAGNOSIS — Z0000 Encounter for general adult medical examination without abnormal findings: Secondary | ICD-10-CM | POA: Insufficient documentation

## 2023-01-03 DIAGNOSIS — Z9229 Personal history of other drug therapy: Secondary | ICD-10-CM | POA: Diagnosis not present

## 2023-01-03 DIAGNOSIS — E7800 Pure hypercholesterolemia, unspecified: Secondary | ICD-10-CM | POA: Insufficient documentation

## 2023-01-03 DIAGNOSIS — E559 Vitamin D deficiency, unspecified: Secondary | ICD-10-CM | POA: Diagnosis not present

## 2023-01-03 DIAGNOSIS — Z1283 Encounter for screening for malignant neoplasm of skin: Secondary | ICD-10-CM

## 2023-01-03 LAB — BASIC METABOLIC PANEL
BUN: 16 mg/dL (ref 6–23)
CO2: 29 mEq/L (ref 19–32)
Calcium: 9.5 mg/dL (ref 8.4–10.5)
Chloride: 103 mEq/L (ref 96–112)
Creatinine, Ser: 0.81 mg/dL (ref 0.40–1.20)
GFR: 66.66 mL/min (ref >60.00)
Glucose, Bld: 73 mg/dL (ref 70–99)
Potassium: 4.4 mEq/L (ref 3.5–5.1)
Sodium: 142 mEq/L (ref 135–145)

## 2023-01-03 LAB — CBC
HCT: 39.5 % (ref 36.0–46.0)
Hemoglobin: 12.9 g/dL (ref 12.0–15.0)
MCHC: 32.8 g/dL (ref 30.0–36.0)
MCV: 88.1 fl (ref 78.0–100.0)
Platelets: 306 10*3/uL (ref 150.0–400.0)
RBC: 4.48 Mil/uL (ref 3.87–5.11)
RDW: 13.7 % (ref 11.5–15.5)
WBC: 4.8 10*3/uL (ref 4.0–10.5)

## 2023-01-03 LAB — VITAMIN D 25 HYDROXY (VIT D DEFICIENCY, FRACTURES): VITD: 38.36 ng/mL (ref 30.00–100.00)

## 2023-01-03 LAB — LIPID PANEL
Cholesterol: 241 mg/dL — ABNORMAL HIGH (ref 0–200)
HDL: 131.1 mg/dL (ref >39.00)
LDL Cholesterol: 99 mg/dL (ref 0–99)
NonHDL: 109.92
Total CHOL/HDL Ratio: 2
Triglycerides: 53 mg/dL (ref 0.0–149.0)
VLDL: 10.6 mg/dL (ref 0.0–40.0)

## 2023-01-03 LAB — VITAMIN B12: Vitamin B-12: 408 pg/mL (ref 211–911)

## 2023-01-03 MED ORDER — DOCUSATE SODIUM 100 MG PO CAPS: 100.0000 mg | ORAL_CAPSULE | Freq: Two times a day (BID) | ORAL | 0 refills | Status: AC | PRN

## 2023-01-03 NOTE — Assessment & Plan Note (Signed)
Bone density ordered pending results.   

## 2023-01-03 NOTE — Progress Notes (Signed)
Established Patient Office Visit  Subjective:  Patient ID: Mandy Castillo, female    DOB: 05-25-1941  Age: 82 y.o. MRN: [id number]  CC:  Chief Complaint  Patient presents with   Establish Care    TOC from Dr Selena Batten    HPI Mandy Castillo is here for a transition of care visit as well as here for annual exam   Prior provider was: Dr. Gweneth Dimitri   Pt is without acute concerns.   Pneumonia vaccine:  Bone density scan:  Shingles vaccine, medicare. Can get at pharmacy.  Utd on eye exams and dental exams.  No known h/o STDs Exercise: walks outside , was walking a bit more until knee pain but getting better so walking a bit more.  General diet.  Does not have a living will.    chronic concerns:  Mild cognitive impairment with memory loss: sees neurologist, on aricept 10 mg nightly at bedtime. Last visit with neurologist 01/29/22:   GERD: doing well on omeprazole 20 mg once daily, doing well with medication. Genella Rife has retreated since starting.   Bil chronic knee pain, uses norco sparingly, sees orthopedist  Does take celebrex but states that she does not see them daily.   H/o SVT, with htn, recently seen 2/24 with previously being seen in 2021 as symptoms stable and pt very active with recent normal EKG. They did order repeat echo (no changes) just for monitoring. No f/u advised on office note.   Overactive bladder: restarted gemtesa doing well with this. Advised to f/u with urology if symptoms worsen or fail to improve.      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   Procedure: TOTAL KNEE ARTHROPLASTY;  Surgeon: Jodi Geralds, MD;  Location: [facility];  Service:  Orthopedics;  Laterality: Left;    Family History  Problem Relation Age of Onset   Stroke Mother 53   Prostate cancer Father     Social History   Socioeconomic History   Marital status: Single    Spouse name: Not on file   Number of children: 0   Years of education: master's degree   Highest education level: Not on file  Occupational History   Occupation: retired  Tobacco Use   Smoking status: Never   Smokeless tobacco: Never  Vaping Use   Vaping Use: Never used  Substance and Sexual Activity   Alcohol use: Yes    Comment: wine every few months   Drug use: Never   Sexual activity: Not Currently  Other Topics Concern   Not on file  Social History Narrative   04/21/20   From: [place], [place]   Living: with partner - Dorena Bodo - 2011   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, corporate and international relations   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        Family: near [place] - cousin and sister in [place]       Enjoys: reading, writing      Exercise: PT currently for  knees   Diet: trying to eat healthy      Safety   Seat belts: Yes    Guns: No   Safe in relationships: Yes    Social Determinants of Health   Financial Resource Strain: Low Risk  (09/29/2022)   Overall Financial Resource Strain (CARDIA)    Difficulty of Paying Living Expenses: Not hard at all  Food Insecurity: No Food Insecurity (09/29/2022)   Hunger Vital Sign    Worried About Running Out of Food in the Last Year: Never true    Ran Out of Food in the Last Year: Never true  Transportation Needs: No Transportation Needs (09/29/2022)   PRAPARE - Administrator, Civil Service (Medical): No    Lack of Transportation (Non-Medical): No  Physical Activity: Inactive (09/29/2022)   Exercise Vital Sign    Days of Exercise per Week: 0 days    Minutes of Exercise per Session: 0 min  Stress: No Stress Concern Present  (09/29/2022)   [employer] of Occupational Health - Occupational Stress Questionnaire    Feeling of Stress : Not at all  Social Connections: Moderately Isolated (09/29/2022)   Social Connection and Isolation Panel [NHANES]    Frequency of Communication with Friends and Family: More than three times a week    Frequency of Social Gatherings with Friends and Family: More than three times a week    Attends Religious Services: Never    Database administrator or Organizations: No    Attends Banker Meetings: Never    Marital Status: Living with partner  Intimate Partner Violence: Not At Risk (09/29/2022)   Humiliation, Afraid, Rape, and Kick questionnaire    Fear of Current or Ex-Partner: No    Emotionally Abused: No    Physically Abused: No    Sexually Abused: No    Outpatient Medications Prior to Visit  Medication Sig Dispense Refill   aspirin EC 325 MG tablet Take 1 tablet (325 mg total) by mouth 2 (two) times daily after a meal. Take x 1 month post op to decrease risk of blood clots. 60 tablet 0   celecoxib (CELEBREX) 100 MG capsule Take 100 mg by mouth 2 (two) times daily.     Cholecalciferol (D3 PO) Take 1 tablet by mouth daily.     Cyanocobalamin (B-12 PO) Take 1 tablet by mouth daily.     donepezil (ARICEPT) 10 MG tablet Take 10 mg by mouth at bedtime.     HYDROcodone-acetaminophen (NORCO) 5-325 MG tablet Take 1-2 tablets by mouth every 6 (six) hours as needed for moderate pain. 40 tablet 0   metoprolol succinate (TOPROL-XL) 50 MG 24 hr tablet Take 1 tablet (50 mg total) by mouth daily. 90 tablet 3   omeprazole (PRILOSEC) 20 MG capsule TAKE 1 CAPSULE BY MOUTH EVERY DAY 90 capsule 1   tiZANidine (ZANAFLEX) 2 MG tablet Take 1 tablet (2 mg total) by mouth every 8 (eight) hours as needed for muscle spasms. 40 tablet 0   docusate sodium (COLACE) 100 MG capsule Take 1 capsule (100 mg total) by mouth 2 (two) times daily. 30 capsule 0   No facility-administered medications  prior to visit.    No Known Allergies  ROS Review of Systems  Review of Systems  Respiratory:  Negative for shortness of breath.   Cardiovascular:  Negative for chest pain and palpitations.  Gastrointestinal:  Negative for constipation and diarrhea.  Genitourinary:  Negative for dysuria, frequency and urgency.  Musculoskeletal:  Negative for myalgias.  Psychiatric/Behavioral:  Negative for depression and suicidal ideas.   All other systems reviewed and are negative.    Objective:    Physical Exam Vitals reviewed.  Constitutional:      General: She is not in acute distress.    Appearance: Normal appearance. She is not ill-appearing or toxic-appearing.  HENT:     Right Ear: Tympanic membrane normal.     Left Ear: Tympanic membrane normal.     Mouth/Throat:     Mouth: Mucous membranes are moist.     Pharynx: No pharyngeal swelling.     Tonsils: No tonsillar exudate.  Eyes:     Extraocular Movements: Extraocular movements intact.     Conjunctiva/sclera: Conjunctivae normal.     Pupils: Pupils are equal, round, and reactive to light.  Neck:     Thyroid: No thyroid mass.  Cardiovascular:     Rate and Rhythm: Normal rate and regular rhythm.     Pulses:          Dorsalis pedis pulses are 2+ on the right side and 2+ on the left side.       Posterior tibial pulses are 2+ on the right side and 2+ on the left side.  Pulmonary:     Effort: Pulmonary effort is normal.     Breath sounds: Normal breath sounds.  Abdominal:     General: Abdomen is flat. Bowel sounds are normal.     Palpations: Abdomen is soft.  Musculoskeletal:        General: Normal range of motion.     Right lower leg: 2+ Edema present.     Left lower leg: 2+ Edema present.  Lymphadenopathy:     Cervical:     Right cervical: No superficial cervical adenopathy.    Left cervical: No superficial cervical adenopathy.  Skin:    General: Skin is warm.     Capillary Refill: Capillary refill takes less than 2  seconds.  Neurological:     General: No focal deficit present.     Mental Status: She is alert and oriented to person, place, and time.  Psychiatric:        Mood and Affect: Mood normal.        Behavior: Behavior normal.        Thought Content: Thought content normal.        Judgment: Judgment normal.      BP 126/64 (BP Location: Left Arm)   Pulse 68   Temp 97.6 F (36.4 C) (Temporal)   Ht 5\' 8"  (1.727 m)   Wt 172 lb 9.6 oz (78.3 kg)   SpO2 98%   BMI 26.24 kg/m  Wt Readings from Last 3 Encounters:  01/03/23 172 lb 9.6 oz (78.3 kg)  11/16/22 172 lb 2 oz (78.1 kg)  09/29/22 170 lb (77.1 kg)     Health Maintenance Due  Topic Date Due   Zoster Vaccines- Shingrix (1 of 2) Never done   DEXA SCAN  Never done    There are no preventive care reminders to display for this patient.  Lab Results  Component Value Date   TSH 3.52 09/01/2020   Lab Results  Component Value Date   WBC 5.3 11/16/2022   HGB 13.1 11/16/2022   HCT 39.5 11/16/2022   MCV 87.3 11/16/2022   PLT 338.0 11/16/2022   Lab Results  Component Value Date   NA 141 11/16/2022   K 4.3 11/16/2022   CO2 32 11/16/2022  GLUCOSE 77 11/16/2022   BUN 18 11/16/2022   CREATININE 0.79 11/16/2022   BILITOT 0.6 11/16/2022   ALKPHOS 74 11/16/2022   AST 16 11/16/2022   ALT 11 11/16/2022   PROT 6.8 11/16/2022   ALBUMIN 4.2 11/16/2022   CALCIUM 9.6 11/16/2022   ANIONGAP 5 04/20/2022   GFR 68.75 11/16/2022   Lab Results  Component Value Date   CHOL 250 (H) 02/09/2022   Lab Results  Component Value Date   HDL 137.90 02/09/2022   Lab Results  Component Value Date   LDLCALC 101 (H) 02/09/2022   Lab Results  Component Value Date   TRIG 58.0 02/09/2022   Lab Results  Component Value Date   CHOLHDL 2 02/09/2022   No results found for: "HGBA1C"    Assessment & Plan:   Nuclear sclerosis, left  Elevated LDL cholesterol level Assessment & Plan: Ordered lipid panel, pending results. Work on low  cholesterol diet and exercise as tolerated   Orders: -     Lipid panel  Postmenopausal Assessment & Plan: Bone density ordered pending results.    Orders: -     DG Bone Density; Future  Screening for skin cancer -     Ambulatory referral to Dermatology  Hx of long term use of blood thinners -     CBC  Vitamin D deficiency Assessment & Plan: Ordered vitamin d pending results.    Orders: -     VITAMIN D 25 Hydroxy (Vit-D Deficiency, Fractures)  Low serum vitamin B12 Assessment & Plan: Ordered b12 pending results   Orders: -     Vitamin B12  Pedal edema Assessment & Plan: Advised pt to wear compression stockings, elevated leg at end of day If no improvement may consider furosemide prn   Orders: -     Basic metabolic panel  Encounter for general adult medical examination without abnormal findings Assessment & Plan: Patient Counseling(The following topics were reviewed):  Preventative care handout given to pt  Health maintenance and immunizations reviewed. Please refer to Health maintenance section. Pt advised on safe sex, wearing seatbelts in car, and proper nutrition labwork ordered today for annual Dental health: Discussed importance of regular tooth brushing, flossing, and dental visits.    Essential hypertension Assessment & Plan: Stable    History of atrial flutter  Mild cognitive impairment with memory loss Assessment & Plan: Continue with aricept 10 mg  F/u with neurology as scheduled.   Need for vaccination against Streptococcus pneumoniae -     Pneumococcal conjugate vaccine 20-valent  Other orders -     Docusate Sodium; Take 1 capsule (100 mg total) by mouth 2 (two) times daily as needed for mild constipation.  Dispense: 30 capsule; Refill: 0    Meds ordered this encounter  Medications   docusate sodium (COLACE) 100 MG capsule    Sig: Take 1 capsule (100 mg total) by mouth 2 (two) times daily as needed for mild constipation.     Dispense:  30 capsule    Refill:  0    Order Specific Question:   Supervising Provider    Answer:   Ermalene Searing, AMY E [[id number]]    Follow-up: Return in about 1 year (around 01/03/2024).    Mort Sawyers, FNP

## 2023-01-03 NOTE — Assessment & Plan Note (Signed)
Continue with aricept 10 mg  F/u with neurology as scheduled.

## 2023-01-03 NOTE — Assessment & Plan Note (Signed)
Ordered vitamin d pending results.   

## 2023-01-03 NOTE — Assessment & Plan Note (Signed)
Stable

## 2023-01-03 NOTE — Patient Instructions (Signed)
I have sent an electronic order over to your preferred location for the following:   []   2D Mammogram  []   3D Mammogram  [x]   Bone Density   Please give this center a call to get scheduled at your convenience.  [x]   [facility]  [address] [place] [address]  [phone]  Make sure to wear two piece  clothing  No lotions powders or deodorants the day of the appointment Make sure to bring picture ID and insurance card.  Bring list of medications you are currently taking including any supplements.    ------------------------------------ A referral was placed today for dermatology  Please let us know if you have not heard back within 2 weeks about the referral.  Stop by the lab prior to leaving today. I will notify you of your results once received.   ------------------------------------   Regards,   Mort Sawyers FNP-C

## 2023-01-03 NOTE — Assessment & Plan Note (Signed)
Ordered b12 pending results  °

## 2023-01-03 NOTE — Assessment & Plan Note (Signed)
Advised pt to wear compression stockings, elevated leg at end of day If no improvement may consider furosemide prn

## 2023-01-03 NOTE — Assessment & Plan Note (Signed)
Ordered lipid panel, pending results. Work on low cholesterol diet and exercise as tolerated  

## 2023-01-03 NOTE — Assessment & Plan Note (Signed)
Patient Counseling(The following topics were reviewed): ? Preventative care handout given to pt  ?Health maintenance and immunizations reviewed. Please refer to Health maintenance section. ?Pt advised on safe sex, wearing seatbelts in car, and proper nutrition ?labwork ordered today for annual ?Dental health: Discussed importance of regular tooth brushing, flossing, and dental visits. ? ? ?

## 2023-01-04 DIAGNOSIS — E7800 Pure hypercholesterolemia, unspecified: Secondary | ICD-10-CM

## 2023-01-04 NOTE — Progress Notes (Signed)
Total cholesterol is higher than desired. It is at 241 goal is less than 200. I would consider starting a statin to help this get more into a improved level, is pt interested? Ldl is ok, goal is less than 70 it is at 99. Focus on a low cholesterol diet.  B12 ok.  Vitamin d good.

## 2023-01-10 MED ORDER — ATORVASTATIN CALCIUM 10 MG PO TABS: 10.0000 mg | ORAL_TABLET | Freq: Every day | ORAL | 3 refills | Status: DC

## 2023-01-11 DIAGNOSIS — M1711 Unilateral primary osteoarthritis, right knee: Secondary | ICD-10-CM | POA: Diagnosis not present

## 2023-01-11 DIAGNOSIS — M25561 Pain in right knee: Secondary | ICD-10-CM | POA: Diagnosis not present

## 2023-01-14 NOTE — Patient Instructions (Signed)
DUE TO COVID-19 ONLY TWO VISITORS  (aged 84 and older)  ARE ALLOWED TO COME WITH YOU AND STAY IN THE WAITING ROOM ONLY DURING PRE OP AND PROCEDURE.   **NO VISITORS ARE ALLOWED IN THE SHORT STAY AREA OR RECOVERY ROOM!!**  IF YOU WILL BE ADMITTED INTO THE HOSPITAL YOU ARE ALLOWED ONLY FOUR SUPPORT PEOPLE DURING VISITATION HOURS ONLY (7 AM -8PM)   The support person(s) must pass our screening, gel in and out, and wear a mask at all times, including in the patient's room. Patients must also wear a mask when staff or their support person are in the room. Visitors GUEST BADGE MUST BE WORN VISIBLY  One adult visitor may remain with you overnight and MUST be in the room by 8 P.M.     Your procedure is scheduled on: 01/31/23   Report to [facility] Main Entrance    Report to admitting at : 11:45 AM   Call this number if you have problems the morning of surgery [phone]   Do not eat food :After Midnight.   After Midnight you may have the following liquids until : 11:00 AM DAY OF SURGERY  Water Black Coffee (sugar ok, NO MILK/CREAM OR CREAMERS)  Tea (sugar ok, NO MILK/CREAM OR CREAMERS) regular and decaf                             Plain Jell-O (NO RED)                                           Fruit ices (not with fruit pulp, NO RED)                                     Popsicles (NO RED)                                                                  Juice: apple, WHITE grape, WHITE cranberry Sports drinks like Gatorade (NO RED)   The day of surgery:  Drink ONE (1) Pre-Surgery Clear Ensure at: 11:00 AM the morning of surgery. Drink in one sitting. Do not sip.  This drink was given to you during your hospital  pre-op appointment visit. Nothing else to drink after completing the  Pre-Surgery Clear Ensure or G2.          If you have questions, please contact your surgeon's office.    Oral Hygiene is also important to reduce your risk of infection.                                     Remember - BRUSH YOUR TEETH THE MORNING OF SURGERY WITH YOUR REGULAR TOOTHPASTE  DENTURES WILL BE REMOVED PRIOR TO SURGERY PLEASE DO NOT APPLY "Poly grip" OR ADHESIVES!!!   Do NOT smoke after Midnight   Take these medicines the morning of surgery with A SIP OF WATER: metoprolol,omeprazole.  You may not have any metal on your body including hair pins, jewelry, and body piercing             Do not wear make-up, lotions, powders, perfumes/cologne, or deodorant  Do not wear nail polish including gel and S&S, artificial/acrylic nails, or any other type of covering on natural nails including finger and toenails. If you have artificial nails, gel coating, etc. that needs to be removed by a nail salon please have this removed prior to surgery or surgery may need to be canceled/ delayed if the surgeon/ anesthesia feels like they are unable to be safely monitored.   Do not shave  48 hours prior to surgery.    Do not bring valuables to the hospital. [facility] IS NOT             RESPONSIBLE   FOR VALUABLES.   Contacts, glasses, or bridgework may not be worn into surgery.   Bring small overnight bag day of surgery.   DO NOT BRING YOUR HOME MEDICATIONS TO THE HOSPITAL. PHARMACY WILL DISPENSE MEDICATIONS LISTED ON YOUR MEDICATION LIST TO YOU DURING YOUR ADMISSION IN THE HOSPITAL!    Patients discharged on the day of surgery will not be allowed to drive home.  Someone NEEDS to stay with you for the first 24 hours after anesthesia.   Special Instructions: Bring a copy of your healthcare power of attorney and living will documents         the day of surgery if you haven't scanned them before.              Please read over the following fact sheets you were given: IF YOU HAVE QUESTIONS ABOUT YOUR PRE-OP INSTRUCTIONS PLEASE CALL [phone]      Pre-operative 5 CHG Bath Instructions   You can play a key role in reducing the risk of infection after surgery. Your  skin needs to be as free of germs as possible. You can reduce the number of germs on your skin by washing with CHG (chlorhexidine gluconate) soap before surgery. CHG is an antiseptic soap that kills germs and continues to kill germs even after washing.   DO NOT use if you have an allergy to chlorhexidine/CHG or antibacterial soaps. If your skin becomes reddened or irritated, stop using the CHG and notify one of our RNs at : [phone].   Please shower with the CHG soap starting 4 days before surgery using the following schedule:     Please keep in mind the following:  DO NOT shave, including legs and underarms, starting the day of your first shower.   You may shave your face at any point before/day of surgery.  Place clean sheets on your bed the day you start using CHG soap. Use a clean washcloth (not used since being washed) for each shower. DO NOT sleep with pets once you start using the CHG.   CHG Shower Instructions:  If you choose to wash your hair and private area, wash first with your normal shampoo/soap.  After you use shampoo/soap, rinse your hair and body thoroughly to remove shampoo/soap residue.  Turn the water OFF and apply about 3 tablespoons (45 ml) of CHG soap to a CLEAN washcloth.  Apply CHG soap ONLY FROM YOUR NECK DOWN TO YOUR TOES (washing for 3-5 minutes)  DO NOT use CHG soap on face, private areas, open wounds, or sores.  Pay special attention to the area where your surgery is being performed.  If you are having back surgery, having someone wash your back for you may be helpful. Wait 2 minutes after CHG soap is applied, then you may rinse off the CHG soap.  Pat dry with a clean towel  Put on clean clothes/pajamas   If you choose to wear lotion, please use ONLY the CHG-compatible lotions on the back of this paper.     Additional instructions for the day of surgery: DO NOT APPLY any lotions, deodorants, cologne, or perfumes.   Put on clean/comfortable clothes.   Brush your teeth.  Ask your nurse before applying any prescription medications to the skin.      CHG Compatible Lotions   Aveeno Moisturizing lotion  Cetaphil Moisturizing Cream  Cetaphil Moisturizing Lotion  Clairol Herbal Essence Moisturizing Lotion, Dry Skin  Clairol Herbal Essence Moisturizing Lotion, Extra Dry Skin  Clairol Herbal Essence Moisturizing Lotion, Normal Skin  Curel Age Defying Therapeutic Moisturizing Lotion with Alpha Hydroxy  Curel Extreme Care Body Lotion  Curel Soothing Hands Moisturizing Hand Lotion  Curel Therapeutic Moisturizing Cream, Fragrance-Free  Curel Therapeutic Moisturizing Lotion, Fragrance-Free  Curel Therapeutic Moisturizing Lotion, Original Formula  Eucerin Daily Replenishing Lotion  Eucerin Dry Skin Therapy Plus Alpha Hydroxy Crme  Eucerin Dry Skin Therapy Plus Alpha Hydroxy Lotion  Eucerin Original Crme  Eucerin Original Lotion  Eucerin Plus Crme Eucerin Plus Lotion  Eucerin TriLipid Replenishing Lotion  Keri Anti-Bacterial Hand Lotion  Keri Deep Conditioning Original Lotion Dry Skin Formula Softly Scented  Keri Deep Conditioning Original Lotion, Fragrance Free Sensitive Skin Formula  Keri Lotion Fast Absorbing Fragrance Free Sensitive Skin Formula  Keri Lotion Fast Absorbing Softly Scented Dry Skin Formula  Keri Original Lotion  Keri Skin Renewal Lotion Keri Silky Smooth Lotion  Keri Silky Smooth Sensitive Skin Lotion  Nivea Body Creamy Conditioning Oil  Nivea Body Extra Enriched Lotion  Nivea Body Original Lotion  Nivea Body Sheer Moisturizing Lotion Nivea Crme  Nivea Skin Firming Lotion  NutraDerm 30 Skin Lotion  NutraDerm Skin Lotion  NutraDerm Therapeutic Skin Cream  NutraDerm Therapeutic Skin Lotion  ProShield Protective Hand Cream  Provon moisturizing lotion   Incentive Spirometer  An incentive spirometer is a tool that can help keep your lungs clear and active. This tool measures how well you are filling your lungs  with each breath. Taking long deep breaths may help reverse or decrease the chance of developing breathing (pulmonary) problems (especially infection) following: A long period of time when you are unable to move or be active. BEFORE THE PROCEDURE  If the spirometer includes an indicator to show your best effort, your nurse or respiratory therapist will set it to a desired goal. If possible, sit up straight or lean slightly forward. Try not to slouch. Hold the incentive spirometer in an upright position. INSTRUCTIONS FOR USE  Sit on the edge of your bed if possible, or sit up as far as you can in bed or on a chair. Hold the incentive spirometer in an upright position. Breathe out normally. Place the mouthpiece in your mouth and seal your lips tightly around it. Breathe in slowly and as deeply as possible, raising the piston or the ball toward the top of the column. Hold your breath for 3-5 seconds or for as long as possible. Allow the piston or ball to fall to the bottom of the column. Remove the mouthpiece from your mouth and breathe out normally. Rest for a few seconds and repeat Steps 1 through 7 at least 10 times every  1-2 hours when you are awake. Take your time and take a few normal breaths between deep breaths. The spirometer may include an indicator to show your best effort. Use the indicator as a goal to work toward during each repetition. After each set of 10 deep breaths, practice coughing to be sure your lungs are clear. If you have an incision (the cut made at the time of surgery), support your incision when coughing by placing a pillow or rolled up towels firmly against it. Once you are able to get out of bed, walk around indoors and cough well. You may stop using the incentive spirometer when instructed by your caregiver.  RISKS AND COMPLICATIONS Take your time so you do not get dizzy or light-headed. If you are in pain, you may need to take or ask for pain medication before doing  incentive spirometry. It is harder to take a deep breath if you are having pain. AFTER USE Rest and breathe slowly and easily. It can be helpful to keep track of a log of your progress. Your caregiver can provide you with a simple table to help with this. If you are using the spirometer at home, follow these instructions: SEEK MEDICAL CARE IF:  You are having difficultly using the spirometer. You have trouble using the spirometer as often as instructed. Your pain medication is not giving enough relief while using the spirometer. You develop fever of 100.5 F (38.1 C) or higher. SEEK IMMEDIATE MEDICAL CARE IF:  You cough up bloody sputum that had not been present before. You develop fever of 102 F (38.9 C) or greater. You develop worsening pain at or near the incision site. MAKE SURE YOU:  Understand these instructions. Will watch your condition. Will get help right away if you are not doing well or get worse. Document Released: 11/22/2006 Document Revised: 10/04/2011 Document Reviewed: 01/23/2007 [facility] Patient Information 2014 [place], [place].   ________________________________________________________________________

## 2023-01-18 ENCOUNTER — Encounter (HOSPITAL_COMMUNITY): Payer: Self-pay

## 2023-01-18 ENCOUNTER — Encounter (HOSPITAL_COMMUNITY)
Admission: RE | Admit: 2023-01-18 | Discharge: 2023-01-18 | Disposition: A | Discharge status: Home / Self Care | Payer: Medicare Other | Source: Ambulatory Visit | Attending: Orthopedic Surgery | Admitting: Orthopedic Surgery

## 2023-01-18 ENCOUNTER — Other Ambulatory Visit: Payer: Self-pay

## 2023-01-18 VITALS — BP 155/69 | HR 65 | Temp 98.3°F | Ht 68.0 in | Wt 166.0 lb

## 2023-01-18 DIAGNOSIS — M1711 Unilateral primary osteoarthritis, right knee: Secondary | ICD-10-CM | POA: Insufficient documentation

## 2023-01-18 DIAGNOSIS — I10 Essential (primary) hypertension: Secondary | ICD-10-CM | POA: Insufficient documentation

## 2023-01-18 DIAGNOSIS — K219 Gastro-esophageal reflux disease without esophagitis: Secondary | ICD-10-CM | POA: Diagnosis not present

## 2023-01-18 DIAGNOSIS — Z01812 Encounter for preprocedural laboratory examination: Secondary | ICD-10-CM | POA: Insufficient documentation

## 2023-01-18 DIAGNOSIS — Z01818 Encounter for other preprocedural examination: Secondary | ICD-10-CM

## 2023-01-18 HISTORY — DX: Personal history of other diseases of the circulatory system: Z86.79

## 2023-01-18 HISTORY — DX: Cardiac arrhythmia, unspecified: I49.9

## 2023-01-18 LAB — SURGICAL PCR SCREEN
MRSA, PCR: NEGATIVE
Staphylococcus aureus: NEGATIVE

## 2023-01-18 NOTE — Progress Notes (Signed)
For Short Stay: COVID SWAB appointment date:  Bowel Prep reminder:   For Anesthesia: PCP - Mort Sawyers: NP Cardiologist - Dr. Nicki Guadalajara. Clearance: Tarri Abernethy Swinyer: NP: 12/09/22.  Chest x-ray - 04/21/22 EKG - 09/03/22 Stress Test -  ECHO - 10/12/19 Cardiac Cath -  Pacemaker/ICD device last checked: Pacemaker orders received: Device Rep notified:  Spinal Cord Stimulator: N/A  Sleep Study - N/A CPAP -   Fasting Blood Sugar - N/A Checks Blood Sugar _____ times a day Date and result of last Hgb A1c-  Last dose of GLP1 agonist- N/A GLP1 instructions:   Last dose of SGLT-2 inhibitors- N/A SGLT-2 instructions:   Blood Thinner Instructions: N/A Aspirin Instructions: Last Dose:  Activity level: Can go up a flight of stairs and activities of daily living without stopping and without chest pain and/or shortness of breath   Able to exercise without chest pain and/or shortness of breath  Anesthesia review: Hx: HTN,SVT,Aflutter.  Patient denies shortness of breath, fever, cough and chest pain at PAT appointment   Patient verbalized understanding of instructions that were given to them at the PAT appointment. Patient was also instructed that they will need to review over the PAT instructions again at home before surgery.

## 2023-01-24 NOTE — Anesthesia Preprocedure Evaluation (Addendum)
Anesthesia Evaluation  Patient identified by MRN, date of birth, ID band Patient awake    Reviewed: Allergy & Precautions, NPO status , Patient's Chart, lab work & pertinent test results  Airway Mallampati: I  TM Distance: >3 FB Neck ROM: Limited    Dental no notable dental hx. (+) Dental Advisory Given   Pulmonary neg pulmonary ROS   Pulmonary exam normal breath sounds clear to auscultation       Cardiovascular hypertension, Pt. on medications and Pt. on home beta blockers Normal cardiovascular exam+ dysrhythmias Atrial Fibrillation + Valvular Problems/Murmurs MR  Rhythm:Regular Rate:Normal  Echo 10/12/22 1. Left ventricular ejection fraction, by estimation, is 60 to 65%. The  left ventricle has normal function. The left ventricle has no regional  wall motion abnormalities. There is mild asymmetric left ventricular  hypertrophy of the basal-septal segment.  Left ventricular diastolic parameters are consistent with Grade I  diastolic dysfunction (impaired relaxation).   2. Right ventricular systolic function is normal. The right ventricular  size is normal.   3. Mild prolapse of the anterior leaflet. The mitral valve is myxomatous.  Mild mitral valve regurgitation.   4. The aortic valve is grossly normal. Aortic valve regurgitation is not  visualized.   5. The inferior vena cava is normal in size with greater than 50%  respiratory variability, suggesting right atrial pressure of 3 mmHg.     Neuro/Psych negative neurological ROS  negative psych ROS   GI/Hepatic Neg liver ROS,GERD  Medicated,,  Endo/Other  Hyperlipidemia  Renal/GU negative Renal ROS  negative genitourinary   Musculoskeletal  (+) Arthritis , Osteoarthritis,  DJD right knee   Abdominal   Peds  Hematology negative hematology ROS (+)   Anesthesia Other Findings   Reproductive/Obstetrics                             Anesthesia  Physical Anesthesia Plan  ASA: 3  Anesthesia Plan: Spinal   Post-op Pain Management: Regional block* and Minimal or no pain anticipated   Induction: Intravenous  PONV Risk Score and Plan: 3 and Treatment may vary due to age or medical condition and Propofol infusion  Airway Management Planned: Natural Airway and Simple Face Mask  Additional Equipment: None  Intra-op Plan:   Post-operative Plan: Extubation in OR  Informed Consent: I have reviewed the patients History and Physical, chart, labs and discussed the procedure including the risks, benefits and alternatives for the proposed anesthesia with the patient or authorized representative who has indicated his/her understanding and acceptance.     Dental advisory given  Plan Discussed with: CRNA and Anesthesiologist  Anesthesia Plan Comments: (See PAT note 01/18/2023)       Anesthesia Quick Evaluation

## 2023-01-24 NOTE — Progress Notes (Signed)
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 never smoker with h/o HTN, GERD, SVT, MVP, right knee djd scheduled for above procedure 01/31/2023 with Dr. Jodi Geralds.   Per cardiology preoperative evaluation 12/09/2022, "Preoperative Cardiovascular Risk Assessment: According to the Revised Cardiac Risk Index (RCRI), her Perioperative Risk of Major Cardiac Event is (%): 0.9. Her Functional Capacity in METs is: 7.04 according to the Duke Activity Status Index (DASI). The patient is doing well from a cardiac perspective. Therefore, based on ACC/AHA guidelines, the patient would be at acceptable risk for the planned procedure without further cardiovascular testing. "  VS: BP (!) 155/69   Pulse 65   Temp 36.8 C (Oral)   Ht 5\' 8"  (1.727 m)   Wt 75.3 kg   SpO2 100%   BMI 25.24 kg/m   PROVIDERS: Mort Sawyers, FNP is PCP   Primary Cardiologist:  Nicki Guadalajara, MD  LABS: Labs reviewed: Acceptable for surgery. (all labs ordered are listed, but only abnormal results are displayed)  Labs Reviewed  SURGICAL PCR SCREEN     IMAGES:   EKG:   CV: 10/12/2022 1. Left ventricular ejection fraction, by estimation, is 60 to 65%. The  left ventricle has normal function. The left ventricle has no regional  wall motion abnormalities. There is mild asymmetric left ventricular  hypertrophy of the basal-septal segment.  Left ventricular diastolic parameters are consistent with Grade I  diastolic dysfunction (impaired relaxation).   2. Right ventricular systolic function is normal. The right ventricular  size is normal.   3. Mild prolapse of the anterior leaflet. The mitral valve is myxomatous.  Mild mitral valve regurgitation.   4. The aortic valve is  grossly normal. Aortic valve regurgitation is not  visualized.   5. The inferior vena cava is normal in size with greater than 50%  respiratory variability, suggesting right atrial pressure of 3 mmHg.   Comparison(s): No significant change from prior study.   Past Medical History:  Diagnosis Date   Arthritis    Dysrhythmia    GERD (gastroesophageal reflux disease)    Hx of atrial flutter    Hypertension    Sebaceous cyst 07/24/2020    Past Surgical History:  Procedure Laterality Date   ABDOMINAL HYSTERECTOMY     APPENDECTOMY     BREAST SURGERY     cyst removed   CATARACT EXTRACTION W/ INTRAOCULAR LENS IMPLANT Right    CHOLECYSTECTOMY     CYST REMOVAL NECK     TOTAL KNEE ARTHROPLASTY Left 05/03/2022   Procedure: TOTAL KNEE ARTHROPLASTY;  Surgeon: Jodi Geralds, MD;  Location: [facility];  Service: Orthopedics;  Laterality: Left;    MEDICATIONS:  Apoaequorin (PREVAGEN) 10 MG CAPS   atorvastatin (LIPITOR) 10 MG tablet   celecoxib (CELEBREX) 100 MG capsule   Cholecalciferol 25 MCG (1000 UT) capsule   Cyanocobalamin (B-12 PO)   docusate sodium (COLACE) 100 MG capsule   donepezil (ARICEPT) 10 MG tablet   metoprolol succinate (TOPROL-XL) 50 MG 24 hr tablet   omeprazole (PRILOSEC) 20 MG capsule   No current facility-administered medications for this encounter.   Jodell Cipro Ward, PA-C [facility] Pre-Surgical Testing [phone]

## 2023-01-26 ENCOUNTER — Other Ambulatory Visit: Payer: Self-pay

## 2023-01-26 DIAGNOSIS — R1013 Epigastric pain: Secondary | ICD-10-CM

## 2023-01-26 MED ORDER — OMEPRAZOLE 20 MG PO CPDR
20.0000 mg | DELAYED_RELEASE_CAPSULE | Freq: Every day | ORAL | 3 refills | Status: AC
Start: 2023-01-26 — End: ?

## 2023-01-26 NOTE — Telephone Encounter (Signed)
Received faxed refill request from [facility].

## 2023-01-26 NOTE — Care Plan (Signed)
Ortho Bundle Case Management Note  Patient Details  Name: Sophiea Kolander MRN: [id number] Date of Birth: 1940-12-23  met with patient in the office for H&P. she will discharge to home with SO. she has equipment at home. HHPT referral to [facility] and OPPT set up with [facility]. discharge information given to patient to share with SO. she has a recent diagnosis of dementia. she is on aricept. she has very poor recall/memory. will anticipate an overnight stay at least. Patient and MD in agreement with plan. Choice offered                    DME Arranged:    DME Agency:     HH Arranged:  PT HH Agency:  [facility]  Additional Comments: Please contact me with any questions of if this plan should need to change.  Shauna Hugh,  RN,BSN,MHA,CCM  [facility] Orthopaedic Specialist  [phone] 01/26/2023, 12:59 PM

## 2023-01-28 DIAGNOSIS — M1711 Unilateral primary osteoarthritis, right knee: Secondary | ICD-10-CM | POA: Diagnosis present

## 2023-01-28 NOTE — H&P (Signed)
TOTAL KNEE ADMISSION H&P  Patient is being admitted for right total knee arthroplasty.  Subjective:  Chief Complaint:right knee pain.  HPI: Mandy Castillo, 82 y.o. female, has a history of pain and functional disability in the right knee due to arthritis and has failed non-surgical conservative treatments for greater than 12 weeks to includecorticosteriod injections, flexibility and strengthening excercises, and activity modification.  Onset of symptoms was gradual, starting 5 years ago with gradually worsening course since that time. The patient noted no past surgery on the right knee(s).  Patient currently rates pain in the right knee(s) at 10 out of 10 with activity. Patient has night pain, worsening of pain with activity and weight bearing, pain that interferes with activities of daily living, pain with passive range of motion, crepitus, and joint swelling.  Patient has evidence of subchondral cysts, subchondral sclerosis, periarticular osteophytes, and joint space narrowing by imaging studies. There is no active infection.  Patient Active Problem List   Diagnosis Date Noted   Primary osteoarthritis of right knee 01/28/2023   Pedal edema 01/03/2023   Encounter for general adult medical examination without abnormal findings 01/03/2023   Low serum vitamin B12 01/03/2023   Vitamin D deficiency 01/03/2023   Elevated LDL cholesterol level 01/03/2023   History of atrial flutter 01/03/2023   Primary osteoarthritis of left knee 05/03/2022   S/P total knee arthroplasty, left 05/03/2022   Atypical pigmented skin lesion 02/09/2022   Mild cognitive impairment with memory loss 09/01/2020   Bilateral chronic knee pain 07/24/2020   Essential hypertension 04/21/2020   Nuclear sclerosis, left 01/30/2020   Pseudophakia, right eye 01/30/2020   Past Medical History:  Diagnosis Date   Arthritis    Dysrhythmia    GERD (gastroesophageal reflux disease)    Hx of atrial flutter    Hypertension     Sebaceous cyst 07/24/2020    Past Surgical History:  Procedure Laterality Date   ABDOMINAL HYSTERECTOMY     APPENDECTOMY     BREAST SURGERY     cyst removed   CATARACT EXTRACTION W/ INTRAOCULAR LENS IMPLANT Right    CHOLECYSTECTOMY     CYST REMOVAL NECK     TOTAL KNEE ARTHROPLASTY Left 05/03/2022   Procedure: TOTAL KNEE ARTHROPLASTY;  Surgeon: Jodi Geralds, MD;  Location: [facility];  Service: Orthopedics;  Laterality: Left;    No current facility-administered medications for this encounter.   Current Outpatient Medications  Medication Sig Dispense Refill Last Dose   Apoaequorin (PREVAGEN) 10 MG CAPS Take 10 mg by mouth daily.      atorvastatin (LIPITOR) 10 MG tablet Take 1 tablet (10 mg total) by mouth daily. 90 tablet 3    celecoxib (CELEBREX) 100 MG capsule Take 100 mg by mouth 2 (two) times daily.      Cholecalciferol 25 MCG (1000 UT) capsule Take 1,000 Units by mouth daily.      Cyanocobalamin (B-12 PO) Take 1 tablet by mouth daily.      docusate sodium (COLACE) 100 MG capsule Take 1 capsule (100 mg total) by mouth 2 (two) times daily as needed for mild constipation. 30 capsule 0    donepezil (ARICEPT) 10 MG tablet Take 10 mg by mouth at bedtime.      metoprolol succinate (TOPROL-XL) 50 MG 24 hr tablet Take 1 tablet (50 mg total) by mouth daily. 90 tablet 3    omeprazole (PRILOSEC) 20 MG capsule Take 1 capsule (20 mg total) by mouth daily. 90 capsule 3    No Known  Allergies  Social History   Tobacco Use   Smoking status: Never   Smokeless tobacco: Never  Substance Use Topics   Alcohol use: Yes    Comment: wine every few months    Family History  Problem Relation Age of Onset   Stroke Mother 79   Prostate cancer Father      Review of Systems  Constitutional: Negative.   HENT: Negative.    Eyes: Negative.   Respiratory: Negative.    Cardiovascular: Negative.   Gastrointestinal: Negative.   Genitourinary: Negative.   Musculoskeletal:  Positive for joint pain.  Skin:  Negative.   Neurological: Negative.   Psychiatric/Behavioral:  Positive for memory loss.    Review of Systems  Constitutional: Negative.   HENT: Negative.    Eyes: Negative.   Respiratory: Negative.    Cardiovascular: Negative.   Gastrointestinal: Negative.   Genitourinary: Negative.   Musculoskeletal:  Positive for joint pain.  Skin: Negative.   Neurological: Negative.   Endo/Heme/Allergies: Negative.   Psychiatric/Behavioral:  Positive for memory loss.      Objective:  Physical Exam Constitutional:      Appearance: Normal appearance.  HENT:     Head: Normocephalic and atraumatic.     Nose: Nose normal.     Mouth/Throat:     Mouth: Mucous membranes are moist.     Pharynx: Oropharynx is clear.  Cardiovascular:     Rate and Rhythm: Normal rate and regular rhythm.     Pulses: Normal pulses.  Pulmonary:     Effort: Pulmonary effort is normal.     Breath sounds: Normal breath sounds.  Abdominal:     Palpations: Abdomen is soft.  Musculoskeletal:        General: Swelling and tenderness present.     Cervical back: Neck supple.  Skin:    General: Skin is warm and dry.  Neurological:     General: No focal deficit present.     Mental Status: Mental status is at baseline.  Psychiatric:        Mood and Affect: Mood normal.     Comments: Some dementia     Vital signs in last 24 hours:    Labs: Recent Results (from the past 2160 hour(s))  CBC     Status: None   Collection Time: 11/16/22  9:51 AM  Result Value Ref Range   WBC 5.3 4.0 - 10.5 K/uL   RBC 4.53 3.87 - 5.11 Mil/uL   Platelets 338.0 150.0 - 400.0 K/uL   Hemoglobin 13.1 12.0 - 15.0 g/dL   HCT 21.3 08.6 - 57.8 %   MCV 87.3 78.0 - 100.0 fl   MCHC 33.0 30.0 - 36.0 g/dL   RDW 46.9 62.9 - 52.8 %  Comprehensive metabolic panel     Status: None   Collection Time: 11/16/22  9:51 AM  Result Value Ref Range   Sodium 141 135 - 145 mEq/L   Potassium 4.3 3.5 - 5.1 mEq/L   Chloride 103 96 - 112 mEq/L   CO2 32 19  - 32 mEq/L   Glucose, Bld 77 70 - 99 mg/dL   BUN 18 6 - 23 mg/dL   Creatinine, Ser 4.13 0.40 - 1.20 mg/dL   Total Bilirubin 0.6 0.2 - 1.2 mg/dL   Alkaline Phosphatase 74 39 - 117 U/L   AST 16 0 - 37 U/L   ALT 11 0 - 35 U/L   Total Protein 6.8 6.0 - 8.3 g/dL   Albumin 4.2 3.5 -  5.2 g/dL   GFR 16.10 >96.04 mL/min    Comment: Calculated using the CKD-EPI Creatinine Equation (2021)   Calcium 9.6 8.4 - 10.5 mg/dL  Lipid panel     Status: Abnormal   Collection Time: 01/03/23 11:17 AM  Result Value Ref Range   Cholesterol 241 (H) 0 - 200 mg/dL    Comment: ATP III Classification       Desirable:  < 200 mg/dL               Borderline High:  200 - 239 mg/dL          High:  > = 540 mg/dL   Triglycerides 98.1 0.0 - 149.0 mg/dL    Comment: Normal:  <191 mg/dLBorderline High:  150 - 199 mg/dL   HDL 478.29 >56.21 mg/dL   VLDL 30.8 0.0 - 65.7 mg/dL   LDL Cholesterol 99 0 - 99 mg/dL   Total CHOL/HDL Ratio 2     Comment:                Men          Women1/2 Average Risk     3.4          3.3Average Risk          5.0          4.42X Average Risk          9.6          7.13X Average Risk          15.0          11.0                       NonHDL 109.92     Comment: NOTE:  Non-HDL goal should be 30 mg/dL higher than patient's LDL goal (i.e. LDL goal of < 70 mg/dL, would have non-HDL goal of < 100 mg/dL)  Vitamin Q46     Status: None   Collection Time: 01/03/23 11:17 AM  Result Value Ref Range   Vitamin B-12 408 211 - 911 pg/mL  VITAMIN D 25 Hydroxy (Vit-D Deficiency, Fractures)     Status: None   Collection Time: 01/03/23 11:17 AM  Result Value Ref Range   VITD 38.36 30.00 - 100.00 ng/mL  CBC     Status: None   Collection Time: 01/03/23 11:17 AM  Result Value Ref Range   WBC 4.8 4.0 - 10.5 K/uL   RBC 4.48 3.87 - 5.11 Mil/uL   Platelets 306.0 150.0 - 400.0 K/uL   Hemoglobin 12.9 12.0 - 15.0 g/dL   HCT 96.2 95.2 - 84.1 %   MCV 88.1 78.0 - 100.0 fl   MCHC 32.8 30.0 - 36.0 g/dL   RDW 32.4 40.1 - 02.7  %  Basic metabolic panel     Status: None   Collection Time: 01/03/23 11:17 AM  Result Value Ref Range   Sodium 142 135 - 145 mEq/L   Potassium 4.4 3.5 - 5.1 mEq/L   Chloride 103 96 - 112 mEq/L   CO2 29 19 - 32 mEq/L   Glucose, Bld 73 70 - 99 mg/dL   BUN 16 6 - 23 mg/dL   Creatinine, Ser 2.53 0.40 - 1.20 mg/dL   GFR 66.44 >03.47 mL/min    Comment: Calculated using the CKD-EPI Creatinine Equation (2021)   Calcium 9.5 8.4 - 10.5 mg/dL  Surgical pcr screen     Status: None   Collection Time:  01/18/23  8:02 AM   Specimen: Nasal Mucosa; Nasal Swab  Result Value Ref Range   MRSA, PCR NEGATIVE NEGATIVE   Staphylococcus aureus NEGATIVE NEGATIVE    Comment: (NOTE) The Xpert SA Assay (FDA approved for NASAL specimens in patients 90 years of age and older), is one component of a comprehensive surveillance program. It is not intended to diagnose infection nor to guide or monitor treatment. Performed at [facility], [address], [place], [place] [address]       Estimated body mass index is 25.24 kg/m as calculated from the following:   Height as of 01/18/23: 5\' 8"  (1.727 m).   Weight as of 01/18/23: 75.3 kg.   Imaging Review Plain radiographs demonstrate severe degenerative joint disease of the right knee(s). The overall alignment isneutral. The bone quality appears to be good for age and reported activity level.      Assessment/Plan:  End stage arthritis, right knee   The patient history, physical examination, clinical judgment of the provider and imaging studies are consistent with end stage degenerative joint disease of the right knee(s) and total knee arthroplasty is deemed medically necessary. The treatment options including medical management, injection therapy arthroscopy and arthroplasty were discussed at length. The risks and benefits of total knee arthroplasty were presented and reviewed. The risks due to aseptic loosening, infection, stiffness,  patella tracking problems, thromboembolic complications and other imponderables were discussed. The patient acknowledged the explanation, agreed to proceed with the plan and consent was signed. Patient is being admitted for inpatient treatment for surgery, pain control, PT, OT, prophylactic antibiotics, VTE prophylaxis, progressive ambulation and ADL's and discharge planning. The patient is planning to be discharged home with home health services. She will need to spend at least 1 night in the hospital for management.     Patient's anticipated LOS is less than 2 midnights, meeting these requirements:  - Lives within 1 hour of care - Has a competent adult at home to recover with post-op recover - NO history of  - Chronic pain requiring opiods  - Diabetes  - Coronary Artery Disease  - Heart failure  - Heart attack  - Stroke  - DVT/VTE  - Cardiac arrhythmia  - Respiratory Failure/COPD  - Renal failure  - Anemia  - Advanced Liver disease

## 2023-01-31 ENCOUNTER — Ambulatory Visit (HOSPITAL_COMMUNITY): Payer: Medicare Other | Admitting: Certified Registered Nurse Anesthetist

## 2023-01-31 ENCOUNTER — Encounter (HOSPITAL_COMMUNITY)
Admission: RE | Disposition: A | Discharge status: Home Health | Payer: Self-pay | Source: Home / Self Care | Attending: Orthopedic Surgery

## 2023-01-31 ENCOUNTER — Observation Stay (HOSPITAL_COMMUNITY)
Admission: RE | Admit: 2023-01-31 | Discharge: 2023-02-01 | Disposition: A | Discharge status: Home Health | Payer: Medicare Other | Attending: Orthopedic Surgery | Admitting: Orthopedic Surgery

## 2023-01-31 ENCOUNTER — Other Ambulatory Visit: Payer: Self-pay

## 2023-01-31 ENCOUNTER — Ambulatory Visit (HOSPITAL_COMMUNITY): Payer: Medicare Other | Admitting: Physician Assistant

## 2023-01-31 ENCOUNTER — Encounter (HOSPITAL_COMMUNITY): Payer: Self-pay | Admitting: Orthopedic Surgery

## 2023-01-31 DIAGNOSIS — M1711 Unilateral primary osteoarthritis, right knee: Principal | ICD-10-CM | POA: Insufficient documentation

## 2023-01-31 DIAGNOSIS — Z96651 Presence of right artificial knee joint: Secondary | ICD-10-CM | POA: Diagnosis not present

## 2023-01-31 DIAGNOSIS — G8918 Other acute postprocedural pain: Secondary | ICD-10-CM | POA: Diagnosis not present

## 2023-01-31 DIAGNOSIS — Z96652 Presence of left artificial knee joint: Secondary | ICD-10-CM | POA: Diagnosis not present

## 2023-01-31 DIAGNOSIS — I10 Essential (primary) hypertension: Secondary | ICD-10-CM

## 2023-01-31 DIAGNOSIS — I4891 Unspecified atrial fibrillation: Secondary | ICD-10-CM | POA: Diagnosis not present

## 2023-01-31 DIAGNOSIS — Z79899 Other long term (current) drug therapy: Secondary | ICD-10-CM | POA: Insufficient documentation

## 2023-01-31 DIAGNOSIS — E785 Hyperlipidemia, unspecified: Secondary | ICD-10-CM

## 2023-01-31 HISTORY — PX: TOTAL KNEE ARTHROPLASTY: SHX125

## 2023-01-31 SURGERY — ARTHROPLASTY, KNEE, TOTAL
Anesthesia: Spinal | Site: Knee | Laterality: Right

## 2023-01-31 MED ORDER — TRANEXAMIC ACID-NACL 1000-0.7 MG/100ML-% IV SOLN
1000.0000 mg | Freq: Once | INTRAVENOUS | Status: AC
  Administered 2023-01-31: 1000 mg via INTRAVENOUS
  Filled 2023-01-31: qty 100

## 2023-01-31 MED ORDER — MORPHINE SULFATE (PF) 2 MG/ML IV SOLN: 0.5000 mg | INTRAVENOUS | Status: DC | PRN

## 2023-01-31 MED ORDER — CEFAZOLIN SODIUM-DEXTROSE 2-4 GM/100ML-% IV SOLN
2.0000 g | Freq: Four times a day (QID) | INTRAVENOUS | Status: AC
  Administered 2023-01-31 – 2023-02-01 (×2): 2 g via INTRAVENOUS
  Filled 2023-01-31 (×2): qty 100

## 2023-01-31 MED ORDER — SODIUM CHLORIDE (PF) 0.9 % IJ SOLN
INTRAMUSCULAR | Status: AC
  Filled 2023-01-31: qty 50

## 2023-01-31 MED ORDER — MAGNESIUM CITRATE PO SOLN: 1.0000 | Freq: Once | ORAL | Status: DC | PRN

## 2023-01-31 MED ORDER — SODIUM CHLORIDE 0.9% FLUSH
INTRAVENOUS | Status: DC | PRN
  Administered 2023-01-31: 30 mL

## 2023-01-31 MED ORDER — BUPIVACAINE-EPINEPHRINE (PF) 0.5% -1:200000 IJ SOLN
INTRAMUSCULAR | Status: AC
  Filled 2023-01-31: qty 30

## 2023-01-31 MED ORDER — BUPIVACAINE LIPOSOME 1.3 % IJ SUSP
INTRAMUSCULAR | Status: DC | PRN
  Administered 2023-01-31: 20 mL

## 2023-01-31 MED ORDER — ONDANSETRON HCL 4 MG/2ML IJ SOLN: 4.0000 mg | Freq: Once | INTRAMUSCULAR | Status: DC | PRN

## 2023-01-31 MED ORDER — BUPIVACAINE IN DEXTROSE 0.75-8.25 % IT SOLN
INTRATHECAL | Status: DC | PRN
  Administered 2023-01-31: 2 mL via INTRATHECAL

## 2023-01-31 MED ORDER — HYDROCODONE-ACETAMINOPHEN 5-325 MG PO TABS: 1.0000 | ORAL_TABLET | ORAL | Status: DC | PRN

## 2023-01-31 MED ORDER — CELECOXIB 100 MG PO CAPS
100.0000 mg | ORAL_CAPSULE | Freq: Two times a day (BID) | ORAL | Status: DC
  Administered 2023-01-31 – 2023-02-01 (×2): 100 mg via ORAL
  Filled 2023-01-31 (×2): qty 1

## 2023-01-31 MED ORDER — ORAL CARE MOUTH RINSE: 15.0000 mL | Freq: Once | OROMUCOSAL | Status: AC

## 2023-01-31 MED ORDER — ALUM & MAG HYDROXIDE-SIMETH 200-200-20 MG/5ML PO SUSP: 30.0000 mL | ORAL | Status: DC | PRN

## 2023-01-31 MED ORDER — SODIUM CHLORIDE 0.9 % IR SOLN
Status: DC | PRN
  Administered 2023-01-31: 1000 mL

## 2023-01-31 MED ORDER — ONDANSETRON HCL 4 MG/2ML IJ SOLN
INTRAMUSCULAR | Status: DC | PRN
  Administered 2023-01-31: 4 mg via INTRAVENOUS

## 2023-01-31 MED ORDER — PROPOFOL 10 MG/ML IV BOLUS
INTRAVENOUS | Status: DC | PRN
  Administered 2023-01-31: 30 mg via INTRAVENOUS
  Administered 2023-01-31: 20 mg via INTRAVENOUS

## 2023-01-31 MED ORDER — FENTANYL CITRATE PF 50 MCG/ML IJ SOSY: 25.0000 ug | PREFILLED_SYRINGE | INTRAMUSCULAR | Status: DC | PRN

## 2023-01-31 MED ORDER — OXYCODONE HCL 5 MG/5ML PO SOLN: 5.0000 mg | Freq: Once | ORAL | Status: DC | PRN

## 2023-01-31 MED ORDER — POLYETHYLENE GLYCOL 3350 17 G PO PACK: 17.0000 g | PACK | Freq: Every day | ORAL | Status: DC | PRN

## 2023-01-31 MED ORDER — CHLORHEXIDINE GLUCONATE 0.12 % MT SOLN
15.0000 mL | Freq: Once | OROMUCOSAL | Status: AC
  Administered 2023-01-31: 15 mL via OROMUCOSAL

## 2023-01-31 MED ORDER — PROPOFOL 500 MG/50ML IV EMUL
INTRAVENOUS | Status: DC | PRN
  Administered 2023-01-31: 100 ug/kg/min via INTRAVENOUS

## 2023-01-31 MED ORDER — MIDAZOLAM HCL 2 MG/2ML IJ SOLN: 2.0000 mg | Freq: Once | INTRAMUSCULAR | Status: DC

## 2023-01-31 MED ORDER — 0.9 % SODIUM CHLORIDE (POUR BTL) OPTIME
TOPICAL | Status: DC | PRN
  Administered 2023-01-31: 1000 mL

## 2023-01-31 MED ORDER — PROPOFOL 1000 MG/100ML IV EMUL
INTRAVENOUS | Status: AC
  Filled 2023-01-31: qty 100

## 2023-01-31 MED ORDER — ROPIVACAINE HCL 7.5 MG/ML IJ SOLN
INTRAMUSCULAR | Status: DC | PRN
  Administered 2023-01-31: 20 mL via PERINEURAL

## 2023-01-31 MED ORDER — OXYCODONE-ACETAMINOPHEN 5-325 MG PO TABS: 1.0000 | ORAL_TABLET | Freq: Four times a day (QID) | ORAL | 0 refills | Status: AC | PRN

## 2023-01-31 MED ORDER — POVIDONE-IODINE 10 % EX SWAB: 2.0000 | Freq: Once | CUTANEOUS | Status: DC

## 2023-01-31 MED ORDER — ZOLPIDEM TARTRATE 5 MG PO TABS: 5.0000 mg | ORAL_TABLET | Freq: Every evening | ORAL | Status: DC | PRN

## 2023-01-31 MED ORDER — DOCUSATE SODIUM 100 MG PO CAPS
100.0000 mg | ORAL_CAPSULE | Freq: Two times a day (BID) | ORAL | Status: DC
  Administered 2023-01-31 – 2023-02-01 (×2): 100 mg via ORAL
  Filled 2023-01-31 (×2): qty 1

## 2023-01-31 MED ORDER — OXYCODONE HCL 5 MG PO TABS: 5.0000 mg | ORAL_TABLET | Freq: Once | ORAL | Status: DC | PRN

## 2023-01-31 MED ORDER — DEXAMETHASONE SODIUM PHOSPHATE 10 MG/ML IJ SOLN
INTRAMUSCULAR | Status: DC | PRN
  Administered 2023-01-31: 5 mg via INTRAVENOUS

## 2023-01-31 MED ORDER — LACTATED RINGERS IV SOLN: INTRAVENOUS | Status: DC

## 2023-01-31 MED ORDER — BUPIVACAINE LIPOSOME 1.3 % IJ SUSP: 20.0000 mL | Freq: Once | INTRAMUSCULAR | Status: DC

## 2023-01-31 MED ORDER — BISACODYL 5 MG PO TBEC: 5.0000 mg | DELAYED_RELEASE_TABLET | Freq: Every day | ORAL | Status: DC | PRN

## 2023-01-31 MED ORDER — PANTOPRAZOLE SODIUM 40 MG PO TBEC
40.0000 mg | DELAYED_RELEASE_TABLET | Freq: Every day | ORAL | Status: DC
  Administered 2023-02-01: 40 mg via ORAL
  Filled 2023-01-31: qty 1

## 2023-01-31 MED ORDER — ACETAMINOPHEN 325 MG PO TABS: 325.0000 mg | ORAL_TABLET | Freq: Four times a day (QID) | ORAL | Status: DC | PRN

## 2023-01-31 MED ORDER — ASPIRIN 325 MG PO TBEC: 325.0000 mg | DELAYED_RELEASE_TABLET | Freq: Two times a day (BID) | ORAL | 0 refills | Status: AC

## 2023-01-31 MED ORDER — ONDANSETRON HCL 4 MG/2ML IJ SOLN: 4.0000 mg | Freq: Four times a day (QID) | INTRAMUSCULAR | Status: DC | PRN

## 2023-01-31 MED ORDER — SODIUM CHLORIDE 0.9 % IV SOLN: INTRAVENOUS | Status: DC

## 2023-01-31 MED ORDER — PHENYLEPHRINE HCL-NACL 20-0.9 MG/250ML-% IV SOLN
INTRAVENOUS | Status: DC | PRN
  Administered 2023-01-31: 15 ug/min via INTRAVENOUS

## 2023-01-31 MED ORDER — BUPIVACAINE LIPOSOME 1.3 % IJ SUSP
INTRAMUSCULAR | Status: AC
  Filled 2023-01-31: qty 20

## 2023-01-31 MED ORDER — TRANEXAMIC ACID-NACL 1000-0.7 MG/100ML-% IV SOLN
1000.0000 mg | INTRAVENOUS | Status: AC
  Administered 2023-01-31: 1000 mg via INTRAVENOUS
  Filled 2023-01-31: qty 100

## 2023-01-31 MED ORDER — METHOCARBAMOL 500 MG PO TABS: 500.0000 mg | ORAL_TABLET | Freq: Four times a day (QID) | ORAL | Status: DC | PRN

## 2023-01-31 MED ORDER — ACETAMINOPHEN 500 MG PO TABS
500.0000 mg | ORAL_TABLET | Freq: Four times a day (QID) | ORAL | Status: AC
  Administered 2023-01-31 – 2023-02-01 (×4): 500 mg via ORAL
  Filled 2023-01-31 (×4): qty 1

## 2023-01-31 MED ORDER — PHENYLEPHRINE HCL-NACL 20-0.9 MG/250ML-% IV SOLN
INTRAVENOUS | Status: AC
  Filled 2023-01-31: qty 250

## 2023-01-31 MED ORDER — TIZANIDINE HCL 2 MG PO TABS: 2.0000 mg | ORAL_TABLET | Freq: Three times a day (TID) | ORAL | 0 refills | Status: AC | PRN

## 2023-01-31 MED ORDER — DONEPEZIL HCL 10 MG PO TABS
10.0000 mg | ORAL_TABLET | Freq: Every day | ORAL | Status: DC
  Administered 2023-01-31: 10 mg via ORAL
  Filled 2023-01-31: qty 1

## 2023-01-31 MED ORDER — DEXAMETHASONE SODIUM PHOSPHATE 4 MG/ML IJ SOLN
4.0000 mg | Freq: Two times a day (BID) | INTRAMUSCULAR | Status: AC
  Administered 2023-01-31 – 2023-02-01 (×2): 4 mg via INTRAVENOUS
  Filled 2023-01-31 (×2): qty 1

## 2023-01-31 MED ORDER — APOAEQUORIN 10 MG PO CAPS: 10.0000 mg | ORAL_CAPSULE | Freq: Every day | ORAL | Status: DC

## 2023-01-31 MED ORDER — ONDANSETRON HCL 4 MG PO TABS: 4.0000 mg | ORAL_TABLET | Freq: Four times a day (QID) | ORAL | Status: DC | PRN

## 2023-01-31 MED ORDER — WATER FOR IRRIGATION, STERILE IR SOLN
Status: DC | PRN
  Administered 2023-01-31: 2000 mL

## 2023-01-31 MED ORDER — FENTANYL CITRATE PF 50 MCG/ML IJ SOSY
100.0000 ug | PREFILLED_SYRINGE | Freq: Once | INTRAMUSCULAR | Status: AC
  Administered 2023-01-31: 50 ug via INTRAVENOUS
  Filled 2023-01-31: qty 2

## 2023-01-31 MED ORDER — CEFAZOLIN SODIUM-DEXTROSE 2-4 GM/100ML-% IV SOLN
2.0000 g | INTRAVENOUS | Status: AC
  Administered 2023-01-31: 2 g via INTRAVENOUS
  Filled 2023-01-31: qty 100

## 2023-01-31 MED ORDER — BUPIVACAINE-EPINEPHRINE 0.5% -1:200000 IJ SOLN
INTRAMUSCULAR | Status: DC | PRN
  Administered 2023-01-31: 30 mL

## 2023-01-31 MED ORDER — METHOCARBAMOL 500 MG IVPB - SIMPLE MED: 500.0000 mg | Freq: Four times a day (QID) | INTRAVENOUS | Status: DC | PRN

## 2023-01-31 MED ORDER — ASPIRIN 325 MG PO TBEC
325.0000 mg | DELAYED_RELEASE_TABLET | Freq: Two times a day (BID) | ORAL | Status: DC
  Administered 2023-01-31 – 2023-02-01 (×2): 325 mg via ORAL
  Filled 2023-01-31 (×2): qty 1

## 2023-01-31 MED ORDER — METOPROLOL SUCCINATE ER 50 MG PO TB24
50.0000 mg | ORAL_TABLET | Freq: Every day | ORAL | Status: DC
  Administered 2023-02-01: 50 mg via ORAL
  Filled 2023-01-31: qty 1

## 2023-01-31 SURGICAL SUPPLY — 58 items
APL SKNCLS STERI-STRIP NONHPOA (GAUZE/BANDAGES/DRESSINGS) ×1
ATTUNE MED DOME PAT 38 KNEE (Knees) IMPLANT
ATTUNE PS FEM RT SZ 5 CEM KNEE (Femur) IMPLANT
ATTUNE PSRP INSE SZ5 7 KNEE (Insert) IMPLANT
BAG COUNTER SPONGE SURGICOUNT (BAG) IMPLANT
BAG SPEC THK2 15X12 ZIP CLS (MISCELLANEOUS) ×1
BAG SPNG CNTER NS LX DISP (BAG)
BAG ZIPLOCK 12X15 (MISCELLANEOUS) ×1 IMPLANT
BASE TIBIA ATTUNE KNEE SYS SZ6 (Knees) IMPLANT
BENZOIN TINCTURE PRP APPL 2/3 (GAUZE/BANDAGES/DRESSINGS) ×1 IMPLANT
BLADE SAGITTAL 25.0X1.19X90 (BLADE) ×1 IMPLANT
BLADE SAW SGTL 13.0X1.19X90.0M (BLADE) ×1 IMPLANT
BLADE SURG SZ10 CARB STEEL (BLADE) ×2 IMPLANT
BNDG CMPR 5X62 HK CLSR LF (GAUZE/BANDAGES/DRESSINGS) ×1
BNDG CMPR 6"X 5 YARDS HK CLSR (GAUZE/BANDAGES/DRESSINGS) ×1
BNDG ELASTIC 6INX 5YD STR LF (GAUZE/BANDAGES/DRESSINGS) ×1 IMPLANT
BOOTIES KNEE HIGH SLOAN (MISCELLANEOUS) ×1 IMPLANT
BOWL SMART MIX CTS (DISPOSABLE) ×1 IMPLANT
BSPLAT TIB 6 CMNT ROT PLAT STR (Knees) ×1 IMPLANT
CEMENT HV SMART SET (Cement) ×2 IMPLANT
COVER SURGICAL LIGHT HANDLE (MISCELLANEOUS) ×1 IMPLANT
CUFF TOURN SGL QUICK 34 (TOURNIQUET CUFF) ×1
CUFF TRNQT CYL 34X4.125X (TOURNIQUET CUFF) ×1 IMPLANT
DRAPE INCISE IOBAN 66X45 STRL (DRAPES) ×1 IMPLANT
DRAPE U-SHAPE 47X51 STRL (DRAPES) ×1 IMPLANT
DRSG AQUACEL AG ADV 3.5X10 (GAUZE/BANDAGES/DRESSINGS) ×1 IMPLANT
DURAPREP 26ML APPLICATOR (WOUND CARE) ×1 IMPLANT
ELECT REM PT RETURN 15FT ADLT (MISCELLANEOUS) ×1 IMPLANT
GLOVE BIOGEL PI IND STRL 8 (GLOVE) ×2 IMPLANT
GLOVE ECLIPSE 7.5 STRL STRAW (GLOVE) ×2 IMPLANT
GOWN STRL REUS W/ TWL XL LVL3 (GOWN DISPOSABLE) ×2 IMPLANT
GOWN STRL REUS W/TWL XL LVL3 (GOWN DISPOSABLE) ×2
HANDPIECE INTERPULSE COAX TIP (DISPOSABLE) ×1
HOLDER FOLEY CATH W/STRAP (MISCELLANEOUS) IMPLANT
HOOD PEEL AWAY T7 (MISCELLANEOUS) ×3 IMPLANT
KIT TURNOVER KIT A (KITS) IMPLANT
MANIFOLD NEPTUNE II (INSTRUMENTS) ×1 IMPLANT
NDL HYPO 22X1.5 SAFETY MO (MISCELLANEOUS) ×2 IMPLANT
NEEDLE HYPO 22X1.5 SAFETY MO (MISCELLANEOUS) ×2 IMPLANT
NS IRRIG 1000ML POUR BTL (IV SOLUTION) ×1 IMPLANT
PACK TOTAL KNEE CUSTOM (KITS) ×1 IMPLANT
PADDING CAST COTTON 6X4 STRL (CAST SUPPLIES) ×1 IMPLANT
PIN STEINMAN FIXATION KNEE (PIN) IMPLANT
PROTECTOR NERVE ULNAR (MISCELLANEOUS) ×1 IMPLANT
SET HNDPC FAN SPRY TIP SCT (DISPOSABLE) ×1 IMPLANT
SPIKE FLUID TRANSFER (MISCELLANEOUS) ×2 IMPLANT
STRIP CLOSURE SKIN 1/2X4 (GAUZE/BANDAGES/DRESSINGS) IMPLANT
SUT MNCRL AB 3-0 PS2 18 (SUTURE) ×1 IMPLANT
SUT VIC AB 0 CT1 36 (SUTURE) ×1 IMPLANT
SUT VIC AB 1 CT1 36 (SUTURE) ×2 IMPLANT
SUT VIC AB 2-0 CT1 27 (SUTURE) ×1
SUT VIC AB 2-0 CT1 TAPERPNT 27 (SUTURE) ×1 IMPLANT
SYR CONTROL 10ML LL (SYRINGE) ×2 IMPLANT
TIBIA ATTUNE KNEE SYS BASE SZ6 (Knees) ×1 IMPLANT
TRAY FOLEY MTR SLVR 16FR STAT (SET/KITS/TRAYS/PACK) IMPLANT
TUBE SUCTION HIGH CAP CLEAR NV (SUCTIONS) ×1 IMPLANT
WATER STERILE IRR 1000ML POUR (IV SOLUTION) ×2 IMPLANT
WRAP KNEE MAXI GEL POST OP (GAUZE/BANDAGES/DRESSINGS) ×1 IMPLANT

## 2023-01-31 NOTE — Plan of Care (Signed)

## 2023-01-31 NOTE — Evaluation (Signed)
Physical Therapy Evaluation Patient Details Name: Mandy Castillo MRN: [id number] DOB: September 07, 1940 Today's Date: 01/31/2023  History of Present Illness  82 yo female presents to therapy s/p R TKA on 01/31/2023 due to failure of conservative measures. 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  Clinical Impression    Mandy Castillo is a 82 y.o. female POD 0 s/p R TKA. Patient reports IND with mobility at baseline. Patient is now limited by functional impairments (see PT problem list below) and requires min A for bed mobility and mod A for sit to stand  from EOB and unable to safely perform SPT or gait assessment at time of eval secondary to R LE instability attributed to slow regression of anesthesia. Patient instructed in exercise to facilitate ROM and circulation to manage edema.  Patient will benefit from continued skilled PT interventions to address impairments and progress towards PLOF. Acute PT will follow to progress mobility and stair training in preparation for safe discharge home with family support and HHPT services.       Assistance Recommended at Discharge Intermittent Supervision/Assistance  If plan is discharge home, recommend the following:  Can travel by private vehicle  A lot of help with walking and/or transfers;A lot of help with bathing/dressing/bathroom;Assistance with cooking/housework;Assist for transportation        Equipment Recommendations None recommended by PT (pt has DME in home setting)  Recommendations for Other Services       Functional Status Assessment Patient has had a recent decline in their functional status and demonstrates the ability to make significant improvements in function in a reasonable and predictable amount of time.     Precautions / Restrictions Precautions Precautions: Knee;Fall Restrictions Weight Bearing Restrictions: Yes RLE Weight Bearing: Weight bearing  as tolerated      Mobility  Bed Mobility Overal bed mobility: Needs Assistance Bed Mobility: Supine to Sit, Sit to Supine     Supine to sit: Min assist, HOB elevated Sit to supine: Min assist   General bed mobility comments: cues, increased time    Transfers Overall transfer level: Needs assistance Equipment used: Rolling walker (2 wheels) Transfers: Sit to/from Stand Sit to Stand: Mod assist, From elevated surface           General transfer comment: deficits with power up and instabiltiy R LE noted with attempts for weight shifting unable to safely complete SPT bed to recliner, pt indicated she did not think her R leg would hold her up    Ambulation/Gait               General Gait Details: NT due to R LE instabiltiy attributed to slow regression of anesthesia  Stairs            Wheelchair Mobility     Tilt Bed    Modified Rankin (Stroke Patients Only)       Balance Overall balance assessment: Needs assistance Sitting-balance support: Feet supported Sitting balance-Leahy Scale: Good     Standing balance support: Bilateral upper extremity supported, During functional activity, Reliant on assistive device for balance Standing balance-Leahy Scale: Poor                               Pertinent Vitals/Pain Pain Assessment Pain Assessment: 0-10 Pain Score: 7  Pain Location: R knee and leg Pain Descriptors / Indicators: Constant, Discomfort, Grimacing, Numbness,  Operative site guarding Pain Intervention(s): Limited activity within patient's tolerance, Premedicated before session, Repositioned, Ice applied    Home Living Family/patient expects to be discharged to:: Private residence Living Arrangements: Spouse/significant other Available Help at Discharge: Family Type of Home: House Home Access: Level entry       Home Layout: One level Home Equipment: Agricultural consultant (2 wheels)      Prior Function Prior Level of Function :  Independent/Modified Independent             Mobility Comments: IND with all ADLs, self care tasks and IADLs       Hand Dominance        Extremity/Trunk Assessment        Lower Extremity Assessment Lower Extremity Assessment: RLE deficits/detail RLE Deficits / Details: ankle DF 3+/5, PF 5/5; SLR AA RLE Sensation: decreased light touch;decreased proprioception    Cervical / Trunk Assessment Cervical / Trunk Assessment:  (wfl)  Communication   Communication: No difficulties  Cognition Arousal/Alertness: Awake/alert Behavior During Therapy: WFL for tasks assessed/performed Overall Cognitive Status: Within Functional Limits for tasks assessed                                          General Comments      Exercises Total Joint Exercises Ankle Circles/Pumps: AROM, 10 reps, Both   Assessment/Plan    PT Assessment Patient needs continued PT services  PT Problem List Decreased strength;Decreased range of motion;Decreased activity tolerance;Decreased balance;Decreased coordination;Decreased mobility;Pain       PT Treatment Interventions DME instruction;Gait training;Stair training;Functional mobility training;Therapeutic activities;Therapeutic exercise;Balance training;Neuromuscular re-education;Patient/family education;Modalities    PT Goals (Current goals can be found in the Care Plan section)  Acute Rehab PT Goals Patient Stated Goal: to get stronger and go home PT Goal Formulation: With patient Time For Goal Achievement: 02/14/23 Potential to Achieve Goals: Good    Frequency 7X/week     Co-evaluation               AM-PAC PT "6 Clicks" Mobility  Outcome Measure Help needed turning from your back to your side while in a flat bed without using bedrails?: A Little Help needed moving from lying on your back to sitting on the side of a flat bed without using bedrails?: A Little Help needed moving to and from a bed to a chair (including a  wheelchair)?: A Little Help needed standing up from a chair using your arms (e.g., wheelchair or bedside chair)?: A Lot Help needed to walk in hospital room?: Total Help needed climbing 3-5 steps with a railing? : Total 6 Click Score: 13    End of Session Equipment Utilized During Treatment: Gait belt Activity Tolerance: Treatment limited secondary to medical complications (Comment) (RLE instability) Patient left: in bed;with call bell/phone within reach Nurse Communication: Mobility status PT Visit Diagnosis: Unsteadiness on feet (R26.81);Other abnormalities of gait and mobility (R26.89);Muscle weakness (generalized) (M62.81);Difficulty in walking, not elsewhere classified (R26.2);Pain Pain - Right/Left: Right Pain - part of body: Knee;Leg    Time: 1800-1831 PT Time Calculation (min) (ACUTE ONLY): 31 min   Charges:   PT Evaluation $PT Eval Low Complexity: 1 Low PT Treatments $Therapeutic Activity: 8-22 mins PT General Charges $$ ACUTE PT VISIT: 1 Visit         Johnny Bridge, PT Acute Rehab   Mandy Castillo 01/31/2023, 6:42 PM

## 2023-01-31 NOTE — Progress Notes (Signed)
Orthopedic Tech Progress Note Patient Details:  Mandy Castillo 08-Jul-1941 [id number]  CPM Right Knee CPM Right Knee: On Right Knee Flexion (Degrees): 60 Right Knee Extension (Degrees): 0 Additional Comments: RLE  Post Interventions Patient Tolerated: Well Instructions Provided: Care of device, Adjustment of device  Sherilyn Banker 01/31/2023, 4:02 PM

## 2023-01-31 NOTE — Transfer of Care (Signed)
Immediate Anesthesia Transfer of Care Note  Patient: Mandy Castillo  Procedure(s) Performed: RIGHT TOTAL KNEE ARTHROPLASTY (Right: Knee)  Patient Location: PACU  Anesthesia Type:Spinal  Level of Consciousness: awake, alert , and oriented  Airway & Oxygen Therapy: Patient Spontanous Breathing and Patient connected to face mask oxygen  Post-op Assessment: Report given to RN and Post -op Vital signs reviewed and stable  Post vital signs: Reviewed and stable  Last Vitals:  Vitals Value Taken Time  BP 105/53 01/31/23 1521  Temp    Pulse 63 01/31/23 1523  Resp 17 01/31/23 1523  SpO2 100 % 01/31/23 1523  Vitals shown include unvalidated device data.  Last Pain:  Vitals:   01/31/23 1250  TempSrc:   PainSc: 0-No pain         Complications: No notable events documented.

## 2023-01-31 NOTE — OR Nursing (Signed)
1210- pt states she ate a piece of toast at 1100 this morning. States the toast did not have anything on it, just plain toasted bread. Called and spoke with Dr. Malen Gauze- aware of above.

## 2023-01-31 NOTE — Anesthesia Procedure Notes (Signed)
Spinal  Patient location during procedure: OR Start time: 01/31/2023 1:30 PM End time: 01/31/2023 1:34 PM Reason for block: surgical anesthesia Staffing Performed by: Mal Amabile, MD Authorized by: Mal Amabile, MD   Preanesthetic Checklist Completed: patient identified, IV checked, site marked, risks and benefits discussed, surgical consent, monitors and equipment checked, pre-op evaluation and timeout performed Spinal Block Patient position: sitting Prep: DuraPrep and site prepped and draped Patient monitoring: heart rate, cardiac monitor, continuous pulse ox and blood pressure Approach: midline Location: L3-4 Injection technique: single-shot Needle Needle type: Pencan  Needle gauge: 24 G Needle length: 9 cm Needle insertion depth: 7 cm Assessment Sensory level: T4 Events: CSF return Additional Notes Patient tolerated procedure well. Adequate sensory level.

## 2023-01-31 NOTE — Op Note (Signed)
PATIENT ID:      Mandy Castillo  MRN:     [id number] DOB/AGE:    04/23/1941 / 82 y.o. y.o.       OPERATIVE REPORT   DATE OF PROCEDURE:  01/31/2023      PREOPERATIVE DIAGNOSIS:   RIGHT KNEE DEGENERATIVE JOINT DISEASE      Estimated body mass index is 25.24 kg/m as calculated from the following:   Height as of this encounter: 5\' 8"  (1.727 m).   Weight as of this encounter: 75.3 kg.                                                       POSTOPERATIVE DIAGNOSIS:   Same                                                                  PROCEDURE:  Procedure(s): RIGHT TOTAL KNEE ARTHROPLASTY Using DepuyAttune RP implants #5 Femur, #6Tibia, 7 mm Attune RP bearing, 38 Patella    SURGEON: Harvie Junior  ASSISTANT:   jim Bethune PA-C   (Present and scrubbed throughout the case, critical for assistance with exposure, retraction, instrumentation, and closure.)        ANESTHESIA: spinal, 20cc Exparel, 50cc 0.25% Marcaine EBL: min cc FLUID REPLACEMENT: unk cc crystaloid TOURNIQUET: DRAINS: None TRANEXAMIC ACID: 1gm IV, 2gm topical COMPLICATIONS:  None         INDICATIONS FOR PROCEDURE: The patient has  RIGHT KNEE DEGENERATIVE JOINT DISEASE, mild valgus deformities, XR shows bone on bone arthritis, lateral subluxation of tibia. Patient has failed all conservative measures including anti-inflammatory medicines, narcotics, attempts at exercise and weight loss, cortisone injections and viscosupplementation.  Risks and benefits of surgery have been discussed, questions answered.   DESCRIPTION OF PROCEDURE: The patient identified by armband, received  IV antibiotics, in the holding area at [facility]. Patient taken to the operating room, appropriate anesthetic monitors were attached, and spinal anesthesia was  induced. IV Tranexamic acid was given. Lateral post and 2 surefoot positioners applied to the table, the lower extremity was then prepped and draped in usual sterile fashion from the toes to the high  thigh. Time-out procedure was performed. Jim bethune PAC, was present and scrubbed throughout the case, critical for assistance with, positioning, exposure, retraction, instrumentation, and closure.The skin and subcutaneous tissue along the incision was injected with 20 cc of a mixture of 20cc Exparel and 30cc Marcaine 50cc saline solution, using a 21-gauge by 1-1/2 inch needle. We began the operation, with the knee flexed 130 degrees, by making the anterior midline incision starting at handbreadth above the patella going over the patella 1 cm medial to and 4 cm distal to the tibial tubercle. Small bleeders in the skin and the subcutaneous tissue identified and cauterized. Transverse retinaculum was incised and reflected medially and a medial parapatellar arthrotomy was accomplished. the patella was everted and theprepatellar fat pad resected. The superficial medial collateral ligament was then elevated from anterior to posterior along the proximal flare of the tibia and anterior half of the menisci resected. The knee was hyperflexed exposing bone on bone  arthritis. Peripheral and notch osteophytes as well as the cruciate ligaments were then resected. We continued to work our way around posteriorly along the proximal tibia, and externally rotated the tibia subluxing it out from underneath the femur. A McHale PCL retractor was placed through the notch, a lateral Hohmann retractor, and anterolateral small homan retractor placed. We then entered the proximal tibia with the Depuy starter drill in line with the axis of the tibia followed by an intramedullary guide rod and 3-degree posterior slope cutting guide. The tibial cutting guide, was pinned into place allowing resection of 10 mm of bone medially and 2 mm of bone laterally. Satisfied with the tibial resection, we then entered the distal femur 2 mm anterior to the PCL origin with the starter drill, followed by the intramedullary guide rod and applied the distal  femoral cutting guide set at 9 mm, with 5 degrees of valgus. This was pinned along the epicondylar axis. At this point, the distal femoral cut was accomplished without difficulty. We then sized for a #7 femoral component and pinned the chamfer guide in 3 degrees of external rotation. The anterior, posterior, and chamfer cuts were accomplished without difficulty followed by the Attune RP box cutting guide and the box cut. We also removed posterior osteophytes from the posterior femoral condyles. The posterior capsule was injected with Exparel solution. The knee was brought into full extension. We checked our extension gap and fit a 7 mm trial lollipop. Distracting in extension with a lamina spreader,  bleeders in the posterior capsule, Posterior medial and posterior lateral gutter were cauterized.  The transexamic acid-soaked sponge was then placed in the gap of the knee in extension. The knee was flexed 30. The posterior patella cut was accomplished with the 9.5 mm Attune cutting guide, sized for a 38 mm dome, and the fixation pegs drilled.The knee was then once again hyperflexed exposing the proximal tibia. We sized for a # 6 tibial base plate, applied the smokestack and the conical reamer followed by the the Delta fin keel punch. We then hammered into place the Attune RP trial femoral component, drilled the lugs, inserted a  7 mm trial bearing, trial patellar button, and took the knee through range of motion from 0-130 degrees. Medial and lateral ligamentous stability was checked. No thumb pressure was required for patellar Tracking.  All trial components were removed, mating surfaces irrigated with pulse lavage, and dried with suction and sponges. 10 cc of the Exparel solution was applied to the cancellus bone of the patella distal femur and proximal tibia.  After waiting 30 seconds, the bony surfaces were again, dried with sponges. A double batch of DePuy HV cement was mixed and applied to all bony metallic  mating surfaces except for the posterior condyles of the femur itself. In order, we hammered into place the tibial tray and removed excess cement, the femoral component and removed excess cement. The final Attune RP bearing was inserted, and the knee brought to full extension with compression. The patellar button was clamped into place, and excess cement removed. The knee was held at 30 flexion with compression using the second surefoot, while the cement cured. The wound was irrigated out with normal saline solution pulse lavage. The rest of the Exparel was injected into the parapatellar arthrotomy, subcutaneous tissues, and periosteal tissues. The parapatellar arthrotomy was closed with running #1 Vicryl suture. The subcutaneous tissue with 3-0 undyed Vicryl suture, and the skin with running 3-0 SQ vicryl. An Aquacil dressing and  Ace wrap were applied. The patient was taken to recovery room without difficulty.   Harvie Junior 01/31/2023, 2:53 PM

## 2023-01-31 NOTE — Anesthesia Procedure Notes (Addendum)
  Anesthesia Regional Block: Adductor canal block   Pre-Anesthetic Checklist: , timeout performed,  Correct Patient, Correct Site, Correct Laterality,  Correct Procedure, Correct Position, site marked,  Risks and benefits discussed,  Surgical consent,  Pre-op evaluation,  At surgeon's request and post-op pain management  Laterality: Right  Prep: chloraprep       Needles:  Injection technique: Single-shot  Needle Type: Echogenic Stimulator Needle     Needle Length: 10cm  Needle Gauge: 21   Needle insertion depth: 7 cm   Additional Needles:   Procedures:,,,, ultrasound used (permanent image in chart),,    Narrative:  Start time: 01/31/2023 1:30 PM End time: 01/31/2023 1:34 PM Injection made incrementally with aspirations every 5 mL.  Performed by: Personally  Anesthesiologist: Mal Amabile, MD  Additional Notes: Timeout performed. Patient sedated. Relevant anatomy ID'd using [place]. Incremental 2-92ml injection of LA with frequent aspiration. Patient tolerated procedure well.

## 2023-01-31 NOTE — Discharge Instructions (Signed)

## 2023-01-31 NOTE — Interval H&P Note (Signed)
History and Physical Interval Note:  01/31/2023 1:18 PM  Mandy Castillo  has presented today for surgery, with the diagnosis of RIGHT KNEE DEGENERATIVE JOINT DISEASE.  The various methods of treatment have been discussed with the patient and family. After consideration of risks, benefits and other options for treatment, the patient has consented to  Procedure(s): TOTAL KNEE ARTHROPLASTY (Right) as a surgical intervention.  The patient's history has been reviewed, patient examined, no change in status, stable for surgery.  I have reviewed the patient's chart and labs.  Questions were answered to the patient's satisfaction.     Harvie Junior

## 2023-01-31 NOTE — Anesthesia Postprocedure Evaluation (Signed)
Anesthesia Post Note  Patient: Rozalynn Enslow  Procedure(s) Performed: RIGHT TOTAL KNEE ARTHROPLASTY (Right: Knee)     Patient location during evaluation: PACU Anesthesia Type: Spinal Level of consciousness: awake, awake and alert and oriented Pain management: pain level controlled Vital Signs Assessment: post-procedure vital signs reviewed and stable Respiratory status: spontaneous breathing, nonlabored ventilation and respiratory function stable Cardiovascular status: blood pressure returned to baseline and stable Postop Assessment: no headache, no backache, spinal receding and no apparent nausea or vomiting Anesthetic complications: no   No notable events documented.  Last Vitals:  Vitals:   01/31/23 1700 01/31/23 1722  BP: 134/67 (!) 141/80  Pulse: (!) 52 64  Resp: 12 17  Temp:    SpO2: 98% 100%    Last Pain:  Vitals:   01/31/23 1722  TempSrc:   PainSc: 0-No pain                 Collene Schlichter

## 2023-01-31 NOTE — Anesthesia Procedure Notes (Signed)
Procedure Name: MAC Date/Time: 01/31/2023 1:35 PM  Performed by: Vanessa [place], CRNAPre-anesthesia Checklist: Patient identified, Emergency Drugs available, Suction available and Patient being monitored Patient Re-evaluated:Patient Re-evaluated prior to induction Oxygen Delivery Method: Simple face mask

## 2023-02-01 ENCOUNTER — Encounter (HOSPITAL_COMMUNITY): Payer: Self-pay | Admitting: Orthopedic Surgery

## 2023-02-01 DIAGNOSIS — Z79899 Other long term (current) drug therapy: Secondary | ICD-10-CM | POA: Diagnosis not present

## 2023-02-01 DIAGNOSIS — I10 Essential (primary) hypertension: Secondary | ICD-10-CM | POA: Diagnosis not present

## 2023-02-01 DIAGNOSIS — Z96652 Presence of left artificial knee joint: Secondary | ICD-10-CM | POA: Diagnosis not present

## 2023-02-01 DIAGNOSIS — M1711 Unilateral primary osteoarthritis, right knee: Secondary | ICD-10-CM | POA: Diagnosis not present

## 2023-02-01 NOTE — TOC Transition Note (Signed)
Transition of Care [facility]) - CM/SW Discharge Note   Patient Details  Name: Mandy Castillo MRN: [id number] Date of Birth: [birth year]  Transition of Care [facility]) CM/SW Contact:  Amada Jupiter, LCSW Phone Number: 02/01/2023, 11:14 AM   Clinical Narrative:     Met with pt and confirming she has needed DME in the home.  HHPT arranged with [facility].  No further TOC needs.  Final next level of care: Home w Home Health Services Barriers to Discharge: No Barriers Identified   Patient Goals and CMS Choice      Discharge Placement                         Discharge Plan and Services Additional resources added to the After Visit Summary for                  DME Arranged: N/A DME Agency: NA       HH Arranged: PT HH Agency: [facility] Date HH Agency Contacted: 02/01/23 Time HH Agency Contacted: 1114 Representative spoke with at [facility] Agency: Morrie Sheldon  Social Determinants of Health (SDOH) Interventions SDOH Screenings   Food Insecurity: No Food Insecurity (01/31/2023)  Housing: Low Risk  (01/31/2023)  Transportation Needs: No Transportation Needs (01/31/2023)  Utilities: Not At Risk (01/31/2023)  Alcohol Screen: Low Risk  (09/29/2022)  Depression (PHQ2-9): Low Risk  (01/03/2023)  Financial Resource Strain: Low Risk  (09/29/2022)  Physical Activity: Inactive (09/29/2022)  Social Connections: Moderately Isolated (09/29/2022)  Stress: No Stress Concern Present (09/29/2022)  Tobacco Use: Low Risk  (01/31/2023)     Readmission Risk Interventions     No data to display

## 2023-02-01 NOTE — Progress Notes (Signed)
Orthopedic Tech Progress Note Patient Details:  Mandy Castillo [birth year] [id number]  Patient ID: [name], female   DOB: Jan 25, 1941, 82 y.o.   MRN: [id number]  Mandy Castillo 02/01/2023, 9:05 AM Knee immobilizer placed in room for use when OOB with PT

## 2023-02-01 NOTE — Progress Notes (Signed)
Subjective: 1 Day Post-Op Procedure(s) (LRB): RIGHT TOTAL KNEE ARTHROPLASTY (Right) Patient reports pain as mild.  The patient is taking fluids by mouth and voiding okay.  Overall feels well.  Denies chest pain or shortness of breath.  Objective: Vital signs in last 24 hours: Temp:  [97.6 F (36.4 C)-98.2 F (36.8 C)] 98.2 F (36.8 C) (07/09 0524) Pulse Rate:  [51-86] 76 (07/09 0524) Resp:  [10-18] 17 (07/09 0524) BP: (109-179)/(58-102) 144/65 (07/09 0524) SpO2:  [98 %-100 %] 98 % (07/09 0524) Weight:  [75.3 kg] 75.3 kg (07/08 1153)  Intake/Output from previous day: 07/08 0701 - 07/09 0700 In: 2300.5 [P.O.:240; I.V.:1694.1; IV Piggyback:366.4] Out: 2450 [Urine:2400; Blood:50] Intake/Output this shift: No intake/output data recorded.  No results for input(s): "HGB" in the last 72 hours. No results for input(s): "WBC", "RBC", "HCT", "PLT" in the last 72 hours. No results for input(s): "NA", "K", "CL", "CO2", "BUN", "CREATININE", "GLUCOSE", "CALCIUM" in the last 72 hours. No results for input(s): "LABPT", "INR" in the last 72 hours. Right knee exam: Neurologically intact Intact pulses distally Dorsiflexion/Plantar flexion intact Incision: dressing C/D/I No cellulitis present Compartment soft   Assessment/Plan: 1 Day Post-Op Procedure(s) (LRB): RIGHT TOTAL KNEE ARTHROPLASTY (Right) Plan: Weight-bear as tolerated on the right. Discharge home with home health after physical therapy this morning. Aspirin 325 mg twice daily for DVT prophylaxis x 1 month postop. All of her meds have been sent into her pharmacy already.  Follow-up with Dr. Luiz Blare in approximately 2 weeks.    Patient's anticipated LOS is less than 2 midnights, meeting these requirements:  - Lives within 1 hour of care - Has a competent adult at home to recover with post-op recover - NO history of  - Chronic pain requiring opiods  - Diabetes  - Coronary Artery Disease  - Heart failure  - Heart attack  -  Stroke  - DVT/VTE  - Cardiac arrhythmia  - Respiratory Failure/COPD  - Renal failure  - Anemia  - Advanced Liver disease     Mandy Castillo 02/01/2023, 8:03 AM

## 2023-02-01 NOTE — Discharge Summary (Signed)
Patient ID: Mandy Castillo MRN: [id number] DOB/AGE: 08-05-1940 82 y.o.  Admit date: 01/31/2023 Discharge date: 02/01/2023  Admission Diagnoses:  Principal Problem:   Primary osteoarthritis of right knee   Discharge Diagnoses:  Same  Past Medical History:  Diagnosis Date   Arthritis    Dysrhythmia    GERD (gastroesophageal reflux disease)    Hx of atrial flutter    Hypertension    Sebaceous cyst 07/24/2020    Surgeries: Procedure(s): RIGHT TOTAL KNEE ARTHROPLASTY on 01/31/2023   Consultants:   Discharged Condition: Improved  Hospital Course: Mandy Castillo is an 82 y.o. female who was admitted 01/31/2023 for operative treatment ofPrimary osteoarthritis of right knee. Patient has severe unremitting pain that affects sleep, daily activities, and work/hobbies. After pre-op clearance the patient was taken to the operating room on 01/31/2023 and underwent  Procedure(s): RIGHT TOTAL KNEE ARTHROPLASTY.    Patient was given perioperative antibiotics:  Anti-infectives (From admission, onward)    Start     Dose/Rate Route Frequency Ordered Stop   01/31/23 2000  ceFAZolin (ANCEF) IVPB 2g/100 mL premix        2 g 200 mL/hr over 30 Minutes Intravenous Every 6 hours 01/31/23 1721 02/01/23 0613   01/31/23 1200  ceFAZolin (ANCEF) IVPB 2g/100 mL premix        2 g 200 mL/hr over 30 Minutes Intravenous On call to O.R. 01/31/23 1147 01/31/23 1342        Patient was given sequential compression devices, early ambulation, and chemoprophylaxis to prevent DVT.  Patient benefited maximally from hospital stay and there were no complications.    Recent vital signs: Patient Vitals for the past 24 hrs:  BP Temp Temp src Pulse Resp SpO2 Height Weight  02/01/23 0524 (!) 144/65 98.2 F (36.8 C) Oral 76 17 98 % -- --  02/01/23 0116 (!) 176/72 97.6 F (36.4 C) Oral 80 17 99 % -- --  01/31/23 2121 (!) 157/67 97.8 F (36.6 C) Oral 71 17 99 % -- --  01/31/23 1931 (!) 163/68 98 F (36.7 C) -- 86 17 99 % --  --  01/31/23 1722 (!) 141/80 97.7 F (36.5 C) Axillary 64 17 100 % -- --  01/31/23 1715 136/65 -- -- (!) 51 11 100 % -- --  01/31/23 1700 134/67 -- -- (!) 52 12 98 % -- --  01/31/23 1645 137/64 -- -- (!) 51 13 100 % -- --  01/31/23 1630 (!) 129/100 -- -- (!) 55 10 100 % -- --  01/31/23 1615 127/79 -- -- (!) 56 12 100 % -- --  01/31/23 1602 (!) 119/102 -- -- (!) 58 13 100 % -- --  01/31/23 1600 -- -- -- (!) 58 11 100 % -- --  01/31/23 1545 (!) 109/96 -- -- (!) 57 15 99 % -- --  01/31/23 1530 (!) 119/58 97.6 F (36.4 C) -- (!) 58 18 100 % -- --  01/31/23 1250 (!) 174/82 -- -- (!) 53 16 100 % -- --  01/31/23 1245 (!) 171/72 -- -- (!) 51 13 100 % -- --  01/31/23 1240 (!) 173/74 -- -- (!) 55 13 100 % -- --  01/31/23 1235 (!) 179/76 -- -- (!) 58 14 100 % -- --  01/31/23 1230 (!) 179/72 -- -- (!) 59 -- 100 % -- --  01/31/23 1215 (!) 161/63 98.1 F (36.7 C) Oral 64 16 100 % -- --  01/31/23 1153 -- -- -- -- -- -- 5\' 8"  (1.727 m)  75.3 kg     Recent laboratory studies: No results for input(s): "WBC", "HGB", "HCT", "PLT", "NA", "K", "CL", "CO2", "BUN", "CREATININE", "GLUCOSE", "INR", "CALCIUM" in the last 72 hours.  Invalid input(s): "PT", "2"   Discharge Medications:   Allergies as of 02/01/2023   No Known Allergies      Medication List     TAKE these medications    aspirin EC 325 MG tablet Take 1 tablet (325 mg total) by mouth 2 (two) times daily after a meal. Take x 1 month post op to decrease risk of blood clots.   atorvastatin 10 MG tablet Commonly known as: LIPITOR Take 1 tablet (10 mg total) by mouth daily.   B-12 PO Take 1 tablet by mouth daily.   celecoxib 100 MG capsule Commonly known as: CELEBREX Take 100 mg by mouth 2 (two) times daily.   Cholecalciferol 25 MCG (1000 UT) capsule Take 1,000 Units by mouth daily.   docusate sodium 100 MG capsule Commonly known as: Colace Take 1 capsule (100 mg total) by mouth 2 (two) times daily as needed for mild constipation.    donepezil 10 MG tablet Commonly known as: ARICEPT Take 10 mg by mouth at bedtime.   metoprolol succinate 50 MG 24 hr tablet Commonly known as: TOPROL-XL Take 1 tablet (50 mg total) by mouth daily.   omeprazole 20 MG capsule Commonly known as: PRILOSEC Take 1 capsule (20 mg total) by mouth daily.   oxyCODONE-acetaminophen 5-325 MG tablet Commonly known as: PERCOCET/ROXICET Take 1-2 tablets by mouth every 6 (six) hours as needed for severe pain.   Prevagen 10 MG Caps Generic drug: Apoaequorin Take 10 mg by mouth daily.   tiZANidine 2 MG tablet Commonly known as: ZANAFLEX Take 1 tablet (2 mg total) by mouth every 8 (eight) hours as needed for muscle spasms.               Durable Medical Equipment  (From admission, onward)           Start     Ordered   01/31/23 1722  DME Walker rolling  Once       Question:  Patient needs a walker to treat with the following condition  Answer:  Primary osteoarthritis of right knee   01/31/23 1721   01/31/23 1722  DME 3 n 1  Once        01/31/23 1721              Discharge Care Instructions  (From admission, onward)           Start     Ordered   02/01/23 0000  Weight bearing as tolerated       Question Answer Comment  Laterality right   Extremity Lower      02/01/23 0806            Diagnostic Studies: No results found.  Disposition: Discharge disposition: 01-Home or Self Care       Discharge Instructions     Call MD / Call 911   Complete by: As directed    If you experience chest pain or shortness of breath, CALL 911 and be transported to the hospital emergency room.  If you develope a fever above 101 F, pus (white drainage) or increased drainage or redness at the wound, or calf pain, call your surgeon's office.   Constipation Prevention   Complete by: As directed    Drink plenty of fluids.  Prune juice may be helpful.  You may use a stool softener, such as Colace (over the counter) 100 mg twice a  day.  Use MiraLax (over the counter) for constipation as needed.   Do not put a pillow under the knee. Place it under the heel.   Complete by: As directed    Increase activity slowly as tolerated   Complete by: As directed    Post-operative opioid taper instructions:   Complete by: As directed    POST-OPERATIVE OPIOID TAPER INSTRUCTIONS: It is important to wean off of your opioid medication as soon as possible. If you do not need pain medication after your surgery it is ok to stop day one. Opioids include: Codeine, Hydrocodone(Norco, Vicodin), Oxycodone(Percocet, oxycontin) and hydromorphone amongst others.  Long term and even short term use of opiods can cause: Increased pain response Dependence Constipation Depression Respiratory depression And more.  Withdrawal symptoms can include Flu like symptoms Nausea, vomiting And more Techniques to manage these symptoms Hydrate well Eat regular healthy meals Stay active Use relaxation techniques(deep breathing, meditating, yoga) Do Not substitute Alcohol to help with tapering If you have been on opioids for less than two weeks and do not have pain than it is ok to stop all together.  Plan to wean off of opioids This plan should start within one week post op of your joint replacement. Maintain the same interval or time between taking each dose and first decrease the dose.  Cut the total daily intake of opioids by one tablet each day Next start to increase the time between doses. The last dose that should be eliminated is the evening dose.      Weight bearing as tolerated   Complete by: As directed    Laterality: right   Extremity: Lower        Follow-up Information     Jodi Geralds, MD. Go on 02/15/2023.   Specialty: Orthopedic Surgery Why: Your appointment is scheduled for 9:15. Contact information: [address] [place] [address] [phone]         [facility] Follow up.   Why: HHPT will  provide 6 home visits prior to starting outpatient physical therapy Contact information: [address] [place] [address] [phone]         [facility] Go on 02/15/2023.   Why: Your therapy appointment is scheduled for 10:40 Contact information: Physical Therapy [address] [place] [address] [phone]                  Signed: Matthew Folks 02/01/2023, 8:06 AM

## 2023-02-01 NOTE — Care Management Obs Status (Signed)
MEDICARE OBSERVATION STATUS NOTIFICATION   Patient Details  Name: Mandy Castillo MRN: [id number] Date of Birth: May 22, 1941   Medicare Observation Status Notification Given:  Hart Robinsons, LCSW 02/01/2023, 12:49 PM

## 2023-02-01 NOTE — Plan of Care (Signed)
Pt ready to DC home with family. 

## 2023-02-01 NOTE — Progress Notes (Signed)
Physical Therapy Treatment Patient Details Name: Mandy Castillo MRN: [id number] DOB: [birth year] Today's Date: 02/01/2023   History of Present Illness 82 yo female presents to therapy s/p R TKA on 01/31/2023 due to failure of conservative measures. 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    PT Comments  Progressing with mobility. New order for KI with instructions for pt to wear OOB (not sure why-good SLR observed). Pt mobilized well. Pain appears controlled. Will plan to have a 2nd session prior to d/c home later today.     Assistance Recommended at Discharge Intermittent Supervision/Assistance  If plan is discharge home, recommend the following:  Can travel by private vehicle    A lot of help with walking and/or transfers;A lot of help with bathing/dressing/bathroom;Assistance with cooking/housework;Assist for transportation      Equipment Recommendations  None recommended by PT    Recommendations for Other Services       Precautions / Restrictions Precautions Precautions: Fall;Knee Required Braces or Orthoses: Knee Immobilizer - Right Knee Immobilizer - Right: On when out of bed or walking Restrictions Weight Bearing Restrictions: No RLE Weight Bearing: Weight bearing as tolerated     Mobility  Bed Mobility Overal bed mobility: Needs Assistance Bed Mobility: Supine to Sit     Supine to sit: Min guard, HOB elevated     General bed mobility comments: Min guard for safety. Cues provided. Increased time.    Transfers Overall transfer level: Needs assistance Equipment used: Rolling walker (2 wheels) Transfers: Sit to/from Stand Sit to Stand: Min assist           General transfer comment: Cues for safety, technique, hand/LE placement with KI on. Assist to steady. Increased time.    Ambulation/Gait Ambulation/Gait assistance: Min assist Gait Distance (Feet): 140 Feet Assistive  device: Rolling walker (2 wheels) Gait Pattern/deviations: Step-through pattern, Decreased stride length, Trunk flexed       General Gait Details: Cues for safety, posture, RW proximity, sequencing. Intermittent assist to steady. Pt reported dizziness prior to standing/walking. She was able to proceed with gait training with no further report of dizziness.   Stairs             Wheelchair Mobility     Tilt Bed    Modified Rankin (Stroke Patients Only)       Balance Overall balance assessment: Needs assistance         Standing balance support: Bilateral upper extremity supported, During functional activity, Reliant on assistive device for balance Standing balance-Leahy Scale: Poor                              Cognition Arousal/Alertness: Awake/alert Behavior During Therapy: WFL for tasks assessed/performed Overall Cognitive Status: Within Functional Limits for tasks assessed                                          Exercises Total Joint Exercises Ankle Circles/Pumps: AROM, Both, 10 reps Quad Sets: AROM, Right, 10 reps Hip ABduction/ADduction: AROM, Right, 10 reps Straight Leg Raises: AROM, Right, 10 reps    General Comments        Pertinent Vitals/Pain Pain Assessment Pain Assessment: Faces Faces Pain Scale: Hurts little more Pain Location: R knee Pain Descriptors / Indicators: Aching, Discomfort  Pain Intervention(s): Limited activity within patient's tolerance, Monitored during session, Repositioned, Ice applied    Home Living                          Prior Function            PT Goals (current goals can now be found in the care plan section) Progress towards PT goals: Progressing toward goals    Frequency    7X/week      PT Plan Current plan remains appropriate    Co-evaluation              AM-PAC PT "6 Clicks" Mobility   Outcome Measure  Help needed turning from your back to your side  while in a flat bed without using bedrails?: A Little Help needed moving from lying on your back to sitting on the side of a flat bed without using bedrails?: A Little Help needed moving to and from a bed to a chair (including a wheelchair)?: A Little Help needed standing up from a chair using your arms (e.g., wheelchair or bedside chair)?: A Little Help needed to walk in hospital room?: A Little Help needed climbing 3-5 steps with a railing? : A Lot 6 Click Score: 17    End of Session Equipment Utilized During Treatment: Gait belt Activity Tolerance: Patient tolerated treatment well Patient left: in chair;with call bell/phone within reach;with chair alarm set   PT Visit Diagnosis: Unsteadiness on feet (R26.81);Other abnormalities of gait and mobility (R26.89);Muscle weakness (generalized) (M62.81);Difficulty in walking, not elsewhere classified (R26.2);Pain Pain - Right/Left: Right Pain - part of body: Knee     Time: 1128-1203 PT Time Calculation (min) (ACUTE ONLY): 35 min  Charges:    $Gait Training: 8-22 mins $Therapeutic Exercise: 8-22 mins PT General Charges $$ ACUTE PT VISIT: 1 Visit                         Faye Ramsay, PT Acute Rehabilitation  Office: [phone]

## 2023-02-01 NOTE — Progress Notes (Signed)
Physical Therapy Treatment Patient Details Name: Mandy Castillo MRN: [id number] DOB: Jan 28, 1941 Today's Date: 02/01/2023   History of Present Illness 82 yo female presents to therapy s/p R TKA on 01/31/2023 due to failure of conservative measures. 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    PT Comments  Pt continues to progress well. Pain remains controlled. All PT education completed.     Assistance Recommended at Discharge Intermittent Supervision/Assistance  If plan is discharge home, recommend the following:  Can travel by private vehicle    A lot of help with walking and/or transfers;A lot of help with bathing/dressing/bathroom;Assistance with cooking/housework;Assist for transportation      Equipment Recommendations  None recommended by PT    Recommendations for Other Services       Precautions / Restrictions Precautions Precautions: Fall;Knee Required Braces or Orthoses: Knee Immobilizer - Right Knee Immobilizer - Right: On when out of bed or walking Restrictions Weight Bearing Restrictions: No RLE Weight Bearing: Weight bearing as tolerated     Mobility  Bed Mobility Overal bed mobility: Needs Assistance Bed Mobility: Supine to Sit     Supine to sit: Min guard, HOB elevated     General bed mobility comments: oob in recliner    Transfers Overall transfer level: Needs assistance Equipment used: Rolling walker (2 wheels) Transfers: Sit to/from Stand Sit to Stand: Min guard           General transfer comment: Cues for safety, technique, hand/LE placement with KI on. Sit to stand x 3 for practice    Ambulation/Gait Ambulation/Gait assistance: Min guard Gait Distance (Feet): 125 Feet Assistive device: Rolling walker (2 wheels) Gait Pattern/deviations: Step-through pattern, Decreased stride length, Trunk flexed       General Gait Details: Cues for safety, posture, RW  proximity, sequencing. No dizziness reported this session   Stairs             Wheelchair Mobility     Tilt Bed    Modified Rankin (Stroke Patients Only)       Balance Overall balance assessment: Needs assistance         Standing balance support: Bilateral upper extremity supported, During functional activity, Reliant on assistive device for balance Standing balance-Leahy Scale: Fair                              Cognition Arousal/Alertness: Awake/alert Behavior During Therapy: WFL for tasks assessed/performed Overall Cognitive Status: Within Functional Limits for tasks assessed                                          Exercises     General Comments        Pertinent Vitals/Pain Pain Assessment Pain Assessment: Faces Faces Pain Scale: Hurts little more Pain Location: R knee Pain Descriptors / Indicators: Aching, Discomfort Pain Intervention(s): Monitored during session    Home Living                          Prior Function            PT Goals (current goals can now be found in the care plan section) Progress towards PT goals: Progressing toward goals    Frequency  7X/week      PT Plan Current plan remains appropriate    Co-evaluation              AM-PAC PT "6 Clicks" Mobility   Outcome Measure  Help needed turning from your back to your side while in a flat bed without using bedrails?: A Little Help needed moving from lying on your back to sitting on the side of a flat bed without using bedrails?: A Little Help needed moving to and from a bed to a chair (including a wheelchair)?: A Little Help needed standing up from a chair using your arms (e.g., wheelchair or bedside chair)?: A Little Help needed to walk in hospital room?: A Little Help needed climbing 3-5 steps with a railing? : A Little 6 Click Score: 18    End of Session Equipment Utilized During Treatment: Gait belt Activity  Tolerance: Patient tolerated treatment well Patient left: in chair;with call bell/phone within reach;with chair alarm set   PT Visit Diagnosis: Difficulty in walking, not elsewhere classified (R26.2);Pain;Other abnormalities of gait and mobility (R26.89) Pain - Right/Left: Right Pain - part of body: Knee     Time: 1400-1415 PT Time Calculation (min) (ACUTE ONLY): 15 min  Charges:    $Gait Training: 8-22 mins PT General Charges $$ ACUTE PT VISIT: 1 Visit                         Faye Ramsay, PT Acute Rehabilitation  Office: [phone]

## 2023-02-02 DIAGNOSIS — K219 Gastro-esophageal reflux disease without esophagitis: Secondary | ICD-10-CM | POA: Diagnosis not present

## 2023-02-02 DIAGNOSIS — Z471 Aftercare following joint replacement surgery: Secondary | ICD-10-CM | POA: Diagnosis not present

## 2023-02-02 DIAGNOSIS — I10 Essential (primary) hypertension: Secondary | ICD-10-CM | POA: Diagnosis not present

## 2023-02-02 DIAGNOSIS — I4891 Unspecified atrial fibrillation: Secondary | ICD-10-CM | POA: Diagnosis not present

## 2023-02-02 DIAGNOSIS — Z791 Long term (current) use of non-steroidal anti-inflammatories (NSAID): Secondary | ICD-10-CM | POA: Diagnosis not present

## 2023-02-02 DIAGNOSIS — Z79891 Long term (current) use of opiate analgesic: Secondary | ICD-10-CM | POA: Diagnosis not present

## 2023-02-02 DIAGNOSIS — Z96651 Presence of right artificial knee joint: Secondary | ICD-10-CM | POA: Diagnosis not present

## 2023-02-02 DIAGNOSIS — Z604 Social exclusion and rejection: Secondary | ICD-10-CM | POA: Diagnosis not present

## 2023-02-02 DIAGNOSIS — I4892 Unspecified atrial flutter: Secondary | ICD-10-CM | POA: Diagnosis not present

## 2023-02-02 DIAGNOSIS — Z7982 Long term (current) use of aspirin: Secondary | ICD-10-CM | POA: Diagnosis not present

## 2023-02-05 DIAGNOSIS — Z96651 Presence of right artificial knee joint: Secondary | ICD-10-CM | POA: Diagnosis not present

## 2023-02-05 DIAGNOSIS — Z604 Social exclusion and rejection: Secondary | ICD-10-CM | POA: Diagnosis not present

## 2023-02-05 DIAGNOSIS — Z79891 Long term (current) use of opiate analgesic: Secondary | ICD-10-CM | POA: Diagnosis not present

## 2023-02-05 DIAGNOSIS — Z7982 Long term (current) use of aspirin: Secondary | ICD-10-CM | POA: Diagnosis not present

## 2023-02-05 DIAGNOSIS — I4892 Unspecified atrial flutter: Secondary | ICD-10-CM | POA: Diagnosis not present

## 2023-02-05 DIAGNOSIS — I10 Essential (primary) hypertension: Secondary | ICD-10-CM | POA: Diagnosis not present

## 2023-02-05 DIAGNOSIS — Z471 Aftercare following joint replacement surgery: Secondary | ICD-10-CM | POA: Diagnosis not present

## 2023-02-05 DIAGNOSIS — Z791 Long term (current) use of non-steroidal anti-inflammatories (NSAID): Secondary | ICD-10-CM | POA: Diagnosis not present

## 2023-02-05 DIAGNOSIS — K219 Gastro-esophageal reflux disease without esophagitis: Secondary | ICD-10-CM | POA: Diagnosis not present

## 2023-02-05 DIAGNOSIS — I4891 Unspecified atrial fibrillation: Secondary | ICD-10-CM | POA: Diagnosis not present

## 2023-02-07 DIAGNOSIS — I4891 Unspecified atrial fibrillation: Secondary | ICD-10-CM | POA: Diagnosis not present

## 2023-02-07 DIAGNOSIS — Z79891 Long term (current) use of opiate analgesic: Secondary | ICD-10-CM | POA: Diagnosis not present

## 2023-02-07 DIAGNOSIS — Z604 Social exclusion and rejection: Secondary | ICD-10-CM | POA: Diagnosis not present

## 2023-02-07 DIAGNOSIS — K219 Gastro-esophageal reflux disease without esophagitis: Secondary | ICD-10-CM | POA: Diagnosis not present

## 2023-02-07 DIAGNOSIS — I4892 Unspecified atrial flutter: Secondary | ICD-10-CM | POA: Diagnosis not present

## 2023-02-07 DIAGNOSIS — Z7982 Long term (current) use of aspirin: Secondary | ICD-10-CM | POA: Diagnosis not present

## 2023-02-07 DIAGNOSIS — I10 Essential (primary) hypertension: Secondary | ICD-10-CM | POA: Diagnosis not present

## 2023-02-07 DIAGNOSIS — Z791 Long term (current) use of non-steroidal anti-inflammatories (NSAID): Secondary | ICD-10-CM | POA: Diagnosis not present

## 2023-02-07 DIAGNOSIS — Z96651 Presence of right artificial knee joint: Secondary | ICD-10-CM | POA: Diagnosis not present

## 2023-02-07 DIAGNOSIS — Z471 Aftercare following joint replacement surgery: Secondary | ICD-10-CM | POA: Diagnosis not present

## 2023-02-08 DIAGNOSIS — M1711 Unilateral primary osteoarthritis, right knee: Secondary | ICD-10-CM | POA: Diagnosis not present

## 2023-02-09 DIAGNOSIS — I4891 Unspecified atrial fibrillation: Secondary | ICD-10-CM | POA: Diagnosis not present

## 2023-02-09 DIAGNOSIS — Z791 Long term (current) use of non-steroidal anti-inflammatories (NSAID): Secondary | ICD-10-CM | POA: Diagnosis not present

## 2023-02-09 DIAGNOSIS — I4892 Unspecified atrial flutter: Secondary | ICD-10-CM | POA: Diagnosis not present

## 2023-02-09 DIAGNOSIS — I10 Essential (primary) hypertension: Secondary | ICD-10-CM | POA: Diagnosis not present

## 2023-02-09 DIAGNOSIS — Z604 Social exclusion and rejection: Secondary | ICD-10-CM | POA: Diagnosis not present

## 2023-02-09 DIAGNOSIS — Z471 Aftercare following joint replacement surgery: Secondary | ICD-10-CM | POA: Diagnosis not present

## 2023-02-09 DIAGNOSIS — K219 Gastro-esophageal reflux disease without esophagitis: Secondary | ICD-10-CM | POA: Diagnosis not present

## 2023-02-09 DIAGNOSIS — Z7982 Long term (current) use of aspirin: Secondary | ICD-10-CM | POA: Diagnosis not present

## 2023-02-09 DIAGNOSIS — Z79891 Long term (current) use of opiate analgesic: Secondary | ICD-10-CM | POA: Diagnosis not present

## 2023-02-09 DIAGNOSIS — Z96651 Presence of right artificial knee joint: Secondary | ICD-10-CM | POA: Diagnosis not present

## 2023-02-11 DIAGNOSIS — I4892 Unspecified atrial flutter: Secondary | ICD-10-CM | POA: Diagnosis not present

## 2023-02-11 DIAGNOSIS — I4891 Unspecified atrial fibrillation: Secondary | ICD-10-CM | POA: Diagnosis not present

## 2023-02-11 DIAGNOSIS — Z79891 Long term (current) use of opiate analgesic: Secondary | ICD-10-CM | POA: Diagnosis not present

## 2023-02-11 DIAGNOSIS — Z604 Social exclusion and rejection: Secondary | ICD-10-CM | POA: Diagnosis not present

## 2023-02-11 DIAGNOSIS — Z791 Long term (current) use of non-steroidal anti-inflammatories (NSAID): Secondary | ICD-10-CM | POA: Diagnosis not present

## 2023-02-11 DIAGNOSIS — I10 Essential (primary) hypertension: Secondary | ICD-10-CM | POA: Diagnosis not present

## 2023-02-11 DIAGNOSIS — Z7982 Long term (current) use of aspirin: Secondary | ICD-10-CM | POA: Diagnosis not present

## 2023-02-11 DIAGNOSIS — K219 Gastro-esophageal reflux disease without esophagitis: Secondary | ICD-10-CM | POA: Diagnosis not present

## 2023-02-11 DIAGNOSIS — Z96651 Presence of right artificial knee joint: Secondary | ICD-10-CM | POA: Diagnosis not present

## 2023-02-11 DIAGNOSIS — Z471 Aftercare following joint replacement surgery: Secondary | ICD-10-CM | POA: Diagnosis not present

## 2023-02-14 DIAGNOSIS — Z7982 Long term (current) use of aspirin: Secondary | ICD-10-CM | POA: Diagnosis not present

## 2023-02-14 DIAGNOSIS — K219 Gastro-esophageal reflux disease without esophagitis: Secondary | ICD-10-CM | POA: Diagnosis not present

## 2023-02-14 DIAGNOSIS — Z791 Long term (current) use of non-steroidal anti-inflammatories (NSAID): Secondary | ICD-10-CM | POA: Diagnosis not present

## 2023-02-14 DIAGNOSIS — Z96651 Presence of right artificial knee joint: Secondary | ICD-10-CM | POA: Diagnosis not present

## 2023-02-14 DIAGNOSIS — Z471 Aftercare following joint replacement surgery: Secondary | ICD-10-CM | POA: Diagnosis not present

## 2023-02-14 DIAGNOSIS — Z79891 Long term (current) use of opiate analgesic: Secondary | ICD-10-CM | POA: Diagnosis not present

## 2023-02-14 DIAGNOSIS — Z604 Social exclusion and rejection: Secondary | ICD-10-CM | POA: Diagnosis not present

## 2023-02-14 DIAGNOSIS — I4892 Unspecified atrial flutter: Secondary | ICD-10-CM | POA: Diagnosis not present

## 2023-02-14 DIAGNOSIS — I4891 Unspecified atrial fibrillation: Secondary | ICD-10-CM | POA: Diagnosis not present

## 2023-02-14 DIAGNOSIS — I10 Essential (primary) hypertension: Secondary | ICD-10-CM | POA: Diagnosis not present

## 2023-02-15 DIAGNOSIS — M6281 Muscle weakness (generalized): Secondary | ICD-10-CM | POA: Diagnosis not present

## 2023-02-15 DIAGNOSIS — Z96651 Presence of right artificial knee joint: Secondary | ICD-10-CM | POA: Diagnosis not present

## 2023-02-15 DIAGNOSIS — M25661 Stiffness of right knee, not elsewhere classified: Secondary | ICD-10-CM | POA: Diagnosis not present

## 2023-02-21 ENCOUNTER — Encounter (HOSPITAL_COMMUNITY): Payer: Self-pay

## 2023-02-21 ENCOUNTER — Emergency Department (HOSPITAL_COMMUNITY): Payer: Medicare Other

## 2023-02-21 ENCOUNTER — Emergency Department (HOSPITAL_COMMUNITY)
Admission: EM | Admit: 2023-02-21 | Discharge: 2023-02-21 | Disposition: A | Discharge status: Home / Self Care | Payer: Medicare Other | Attending: Emergency Medicine | Admitting: Emergency Medicine

## 2023-02-21 DIAGNOSIS — M160 Bilateral primary osteoarthritis of hip: Secondary | ICD-10-CM | POA: Diagnosis not present

## 2023-02-21 DIAGNOSIS — S01111A Laceration without foreign body of right eyelid and periocular area, initial encounter: Secondary | ICD-10-CM | POA: Insufficient documentation

## 2023-02-21 DIAGNOSIS — I10 Essential (primary) hypertension: Secondary | ICD-10-CM | POA: Diagnosis not present

## 2023-02-21 DIAGNOSIS — R6889 Other general symptoms and signs: Secondary | ICD-10-CM | POA: Diagnosis not present

## 2023-02-21 DIAGNOSIS — Z96651 Presence of right artificial knee joint: Secondary | ICD-10-CM | POA: Insufficient documentation

## 2023-02-21 DIAGNOSIS — Z79899 Other long term (current) drug therapy: Secondary | ICD-10-CM | POA: Insufficient documentation

## 2023-02-21 DIAGNOSIS — J9811 Atelectasis: Secondary | ICD-10-CM | POA: Diagnosis not present

## 2023-02-21 DIAGNOSIS — S01511A Laceration without foreign body of lip, initial encounter: Secondary | ICD-10-CM | POA: Diagnosis not present

## 2023-02-21 DIAGNOSIS — M25559 Pain in unspecified hip: Secondary | ICD-10-CM | POA: Diagnosis not present

## 2023-02-21 DIAGNOSIS — S299XXA Unspecified injury of thorax, initial encounter: Secondary | ICD-10-CM | POA: Diagnosis not present

## 2023-02-21 DIAGNOSIS — W1830XA Fall on same level, unspecified, initial encounter: Secondary | ICD-10-CM | POA: Insufficient documentation

## 2023-02-21 DIAGNOSIS — Z7982 Long term (current) use of aspirin: Secondary | ICD-10-CM | POA: Diagnosis not present

## 2023-02-21 DIAGNOSIS — W19XXXA Unspecified fall, initial encounter: Secondary | ICD-10-CM

## 2023-02-21 DIAGNOSIS — R404 Transient alteration of awareness: Secondary | ICD-10-CM | POA: Diagnosis not present

## 2023-02-21 DIAGNOSIS — Z043 Encounter for examination and observation following other accident: Secondary | ICD-10-CM | POA: Diagnosis not present

## 2023-02-21 DIAGNOSIS — R9082 White matter disease, unspecified: Secondary | ICD-10-CM | POA: Diagnosis not present

## 2023-02-21 DIAGNOSIS — M25461 Effusion, right knee: Secondary | ICD-10-CM | POA: Diagnosis not present

## 2023-02-21 DIAGNOSIS — S0181XA Laceration without foreign body of other part of head, initial encounter: Secondary | ICD-10-CM | POA: Diagnosis present

## 2023-02-21 DIAGNOSIS — Z743 Need for continuous supervision: Secondary | ICD-10-CM | POA: Diagnosis not present

## 2023-02-21 DIAGNOSIS — S0531XA Ocular laceration without prolapse or loss of intraocular tissue, right eye, initial encounter: Secondary | ICD-10-CM | POA: Diagnosis not present

## 2023-02-21 LAB — MAGNESIUM: Magnesium: 2.1 mg/dL (ref 1.7–2.4)

## 2023-02-21 LAB — URINALYSIS, ROUTINE W REFLEX MICROSCOPIC
Bilirubin Urine: NEGATIVE
Glucose, UA: NEGATIVE mg/dL
Hgb urine dipstick: NEGATIVE
Ketones, ur: NEGATIVE mg/dL
Leukocytes,Ua: NEGATIVE
Nitrite: NEGATIVE
Protein, ur: NEGATIVE mg/dL
Specific Gravity, Urine: 1.023 (ref 1.005–1.030)
pH: 5 (ref 5.0–8.0)

## 2023-02-21 LAB — CBC WITH DIFFERENTIAL/PLATELET
Abs Immature Granulocytes: 0.02 10*3/uL (ref 0.00–0.07)
Basophils Absolute: 0 10*3/uL (ref 0.0–0.1)
Basophils Relative: 0 %
Eosinophils Absolute: 0.1 10*3/uL (ref 0.0–0.5)
Eosinophils Relative: 2 %
HCT: 34.9 % — ABNORMAL LOW (ref 36.0–46.0)
Hemoglobin: 10.7 g/dL — ABNORMAL LOW (ref 12.0–15.0)
Immature Granulocytes: 0 %
Lymphocytes Relative: 21 %
Lymphs Abs: 1.3 10*3/uL (ref 0.7–4.0)
MCH: 28.2 pg (ref 26.0–34.0)
MCHC: 30.7 g/dL (ref 30.0–36.0)
MCV: 91.8 fL (ref 80.0–100.0)
Monocytes Absolute: 0.6 10*3/uL (ref 0.1–1.0)
Monocytes Relative: 11 %
Neutro Abs: 3.9 10*3/uL (ref 1.7–7.7)
Neutrophils Relative %: 66 %
Platelets: 403 10*3/uL — ABNORMAL HIGH (ref 150–400)
RBC: 3.8 MIL/uL — ABNORMAL LOW (ref 3.87–5.11)
RDW: 13.3 % (ref 11.5–15.5)
WBC: 6 10*3/uL (ref 4.0–10.5)
nRBC: 0 % (ref 0.0–0.2)

## 2023-02-21 LAB — COMPREHENSIVE METABOLIC PANEL
ALT: 17 U/L (ref 0–44)
AST: 19 U/L (ref 15–41)
Albumin: 3.7 g/dL (ref 3.5–5.0)
Alkaline Phosphatase: 85 U/L (ref 38–126)
Anion gap: 8 (ref 5–15)
BUN: 15 mg/dL (ref 8–23)
CO2: 27 mmol/L (ref 22–32)
Calcium: 9.2 mg/dL (ref 8.9–10.3)
Chloride: 104 mmol/L (ref 98–111)
Creatinine, Ser: 0.83 mg/dL (ref 0.44–1.00)
GFR, Estimated: >60 mL/min (ref >60)
Glucose, Bld: 104 mg/dL — ABNORMAL HIGH (ref 70–99)
Potassium: 4.2 mmol/L (ref 3.5–5.1)
Sodium: 139 mmol/L (ref 135–145)
Total Bilirubin: 0.6 mg/dL (ref 0.3–1.2)
Total Protein: 6.9 g/dL (ref 6.5–8.1)

## 2023-02-21 LAB — I-STAT CHEM 8, ED
BUN: 13 mg/dL (ref 8–23)
Calcium, Ion: 1.2 mmol/L (ref 1.15–1.40)
Chloride: 104 mmol/L (ref 98–111)
Creatinine, Ser: 0.9 mg/dL (ref 0.44–1.00)
Glucose, Bld: 96 mg/dL (ref 70–99)
HCT: 32 % — ABNORMAL LOW (ref 36.0–46.0)
Hemoglobin: 10.9 g/dL — ABNORMAL LOW (ref 12.0–15.0)
Potassium: 4.1 mmol/L (ref 3.5–5.1)
Sodium: 139 mmol/L (ref 135–145)
TCO2: 28 mmol/L (ref 22–32)

## 2023-02-21 LAB — TROPONIN I (HIGH SENSITIVITY): Troponin I (High Sensitivity): 6 ng/L (ref <18)

## 2023-02-21 LAB — ETHANOL: Alcohol, Ethyl (B): <10 mg/dL (ref <10)

## 2023-02-21 LAB — CK: Total CK: 138 U/L (ref 38–234)

## 2023-02-21 LAB — I-STAT CG4 LACTIC ACID, ED: Lactic Acid, Venous: 0.7 mmol/L (ref 0.5–1.9)

## 2023-02-21 MED ORDER — ACETAMINOPHEN 325 MG PO TABS
650.0000 mg | ORAL_TABLET | Freq: Once | ORAL | Status: AC
  Administered 2023-02-21: 650 mg via ORAL
  Filled 2023-02-21: qty 2

## 2023-02-21 MED ORDER — LIDOCAINE-EPINEPHRINE-TETRACAINE (LET) TOPICAL GEL
3.0000 mL | Freq: Once | TOPICAL | Status: AC
  Administered 2023-02-21: 3 mL via TOPICAL
  Filled 2023-02-21: qty 3

## 2023-02-21 MED ORDER — LIDOCAINE-EPINEPHRINE (PF) 2 %-1:200000 IJ SOLN
10.0000 mL | Freq: Once | INTRAMUSCULAR | Status: AC
  Administered 2023-02-21: 10 mL via INTRADERMAL
  Filled 2023-02-21: qty 20

## 2023-02-21 NOTE — ED Triage Notes (Signed)
BIBA from home for fall, lac above right eye and lip.Pt had a knee replacement 2 weeks ago- husband found pt on floor at home when he returned from a Dr appt, pt is unsure of how she got on the floor. Hx of dementia. 164/73 BP 70 HR 100% room air 112 cbg

## 2023-02-21 NOTE — ED Provider Notes (Signed)
[place] EMERGENCY DEPARTMENT AT [facility] Provider Note   CSN: [id number] Arrival date & time: 02/21/23  1603     History  Chief Complaint  Patient presents with   Marletta Lor    Mandy Castillo is a 82 y.o. female.  HPI Patient presents after an unwitnessed fall.  Medical history includes arthritis, GERD, atrial flutter, HTN, mild cognitive impairment with memory loss.  She underwent total right knee arthroplasty 3 weeks ago.  She has had poor mobility since that time.  Has been found on the floor shortly prior to arrival.  Patient does not remember the fall.  LOC is unknown.  EMS noted a right forehead laceration.  She is not on any blood thinners.  Patient endorses some mild pain in her front teeth.  She has had ongoing pain to her right knee following her recent surgery.  She states that this pain is slightly worsened which she attributes to the bumpy ambulance ride.  She denies any other areas of discomfort.  EMS reports normal vital signs and blood sugar prior to arrival.    Home Medications Prior to Admission medications   Medication Sig Start Date End Date Taking? Authorizing Provider  Apoaequorin (PREVAGEN) 10 MG CAPS Take 10 mg by mouth daily.    [provider]  aspirin EC 325 MG tablet Take 1 tablet (325 mg total) by mouth 2 (two) times daily after a meal. Take x 1 month post op to decrease risk of blood clots. 01/31/23   Marshia Ly, PA-C  atorvastatin (LIPITOR) 10 MG tablet Take 1 tablet (10 mg total) by mouth daily. 01/10/23   Mort Sawyers, FNP  celecoxib (CELEBREX) 100 MG capsule Take 100 mg by mouth 2 (two) times daily. 05/03/22   [provider]  Cholecalciferol 25 MCG (1000 UT) capsule Take 1,000 Units by mouth daily.    [provider]  Cyanocobalamin (B-12 PO) Take 1 tablet by mouth daily.    [provider]  docusate sodium (COLACE) 100 MG capsule Take 1 capsule (100 mg total) by mouth 2 (two) times daily as needed  for mild constipation. 01/03/23   Mort Sawyers, FNP  donepezil (ARICEPT) 10 MG tablet Take 10 mg by mouth at bedtime. 04/23/21   [provider]  metoprolol succinate (TOPROL-XL) 50 MG 24 hr tablet Take 1 tablet (50 mg total) by mouth daily. 11/22/22   Lennette Bihari, MD  omeprazole (PRILOSEC) 20 MG capsule Take 1 capsule (20 mg total) by mouth daily. 01/26/23   Mort Sawyers, FNP  oxyCODONE-acetaminophen (PERCOCET/ROXICET) 5-325 MG tablet Take 1-2 tablets by mouth every 6 (six) hours as needed for severe pain. 01/31/23   Marshia Ly, PA-C  tiZANidine (ZANAFLEX) 2 MG tablet Take 1 tablet (2 mg total) by mouth every 8 (eight) hours as needed for muscle spasms. 01/31/23   Marshia Ly, PA-C      Allergies    Patient has no known allergies.    Review of Systems   Review of Systems  HENT:  Positive for facial swelling.   Skin:  Positive for wound.  All other systems reviewed and are negative.   Physical Exam Updated Vital Signs BP (!) 163/66   Pulse 76   Temp 98.9 F (37.2 C) (Oral)   Resp 17   SpO2 100%  Physical Exam Vitals and nursing note reviewed.  Constitutional:      General: She is not in acute distress.    Appearance: Normal appearance. She is well-developed.  She is not ill-appearing or toxic-appearing.  HENT:     Head: Normocephalic.     Comments: 2 cm laceration to right eyebrow, hemostatic    Right Ear: External ear normal.     Left Ear: External ear normal.     Nose: Nose normal.     Mouth/Throat:     Mouth: Mucous membranes are moist.     Comments: Mild swelling to right upper and lower lips, hemostatic subcentimeter laceration to mucosal surface of upper right lip, abrasion to external right upper lip.  Tenderness to front right incisors. Eyes:     Extraocular Movements: Extraocular movements intact.     Conjunctiva/sclera: Conjunctivae normal.  Cardiovascular:     Rate and Rhythm: Normal rate and regular rhythm.     Heart sounds: No murmur  heard. Pulmonary:     Effort: Pulmonary effort is normal. No respiratory distress.     Breath sounds: Normal breath sounds. No wheezing or rales.  Chest:     Chest wall: No tenderness.  Abdominal:     General: There is no distension.     Palpations: Abdomen is soft.     Tenderness: There is no abdominal tenderness.  Musculoskeletal:        General: No swelling or deformity.     Cervical back: Normal range of motion and neck supple.  Skin:    General: Skin is warm and dry.     Coloration: Skin is not jaundiced or pale.  Neurological:     General: No focal deficit present.     Mental Status: She is alert.  Psychiatric:        Mood and Affect: Mood normal.        Behavior: Behavior normal.     ED Results / Procedures / Treatments   Labs (all labs ordered are listed, but only abnormal results are displayed) Labs Reviewed  COMPREHENSIVE METABOLIC PANEL - Abnormal; Notable for the following components:      Result Value   Glucose, Bld 104 (*)    All other components within normal limits  URINALYSIS, ROUTINE W REFLEX MICROSCOPIC - Abnormal; Notable for the following components:   APPearance HAZY (*)    All other components within normal limits  CBC WITH DIFFERENTIAL/PLATELET - Abnormal; Notable for the following components:   RBC 3.80 (*)    Hemoglobin 10.7 (*)    HCT 34.9 (*)    Platelets 403 (*)    All other components within normal limits  I-STAT CHEM 8, ED - Abnormal; Notable for the following components:   Hemoglobin 10.9 (*)    HCT 32.0 (*)    All other components within normal limits  ETHANOL  MAGNESIUM  CK  I-STAT CG4 LACTIC ACID, ED  TROPONIN I (HIGH SENSITIVITY)  TROPONIN I (HIGH SENSITIVITY)    EKG EKG Interpretation Date/Time:  Monday February 21 2023 17:12:43 EDT Ventricular Rate:  74 PR Interval:  164 QRS Duration:  86 QT Interval:  407 QTC Calculation: 452 R Axis:   35  Text Interpretation: Sinus rhythm Confirmed by Gloris Manchester ([id number]) on 02/21/2023  10:11:37 PM  Radiology CT HEAD WO CONTRAST  Result Date: 02/21/2023 CLINICAL DATA:  Fall, laceration above right eye and lip EXAM: CT HEAD WITHOUT CONTRAST CT MAXILLOFACIAL WITHOUT CONTRAST CT CERVICAL SPINE WITHOUT CONTRAST TECHNIQUE: Multidetector CT imaging of the head, cervical spine, and maxillofacial structures were performed using the standard protocol without intravenous contrast. Multiplanar CT image reconstructions of the cervical spine and maxillofacial structures  were also generated. RADIATION DOSE REDUCTION: This exam was performed according to the departmental dose-optimization program which includes automated exposure control, adjustment of the mA and/or kV according to patient size and/or use of iterative reconstruction technique. COMPARISON:  None Available. FINDINGS: CT HEAD FINDINGS Brain: No evidence of acute infarction, hemorrhage, hydrocephalus, extra-axial collection or mass lesion/mass effect. Periventricular and deep white matter hypodensity. Vascular: No hyperdense vessel or unexpected calcification. CT FACIAL BONES FINDINGS Skull: Normal. Negative for fracture or focal lesion. Facial bones: No displaced fractures or dislocations. Sinuses/Orbits: No acute finding. Other: Soft tissue contusion of the right brow (series 3, image 67). Soft tissue laceration of the right upper lip (series 9, image 22). CT CERVICAL SPINE FINDINGS Alignment: Degenerative straightening and reversal of the normal cervical lordosis. Degenerative anterolisthesis of C3 on C4. Skull base and vertebrae: No acute fracture. No primary bone lesion or focal pathologic process. Soft tissues and spinal canal: No prevertebral fluid or swelling. No visible canal hematoma. 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 Upper chest: Negative. Other: None. IMPRESSION: 1. No acute intracranial pathology. Small-vessel white matter disease. 2. No displaced fractures or  dislocations of the facial bones. 3. Soft tissue contusion of the right brow. Soft tissue laceration of the right upper lip. 4. No fracture or static subluxation of the cervical spine. 5. 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 Degenerative anterolisthesis of C3 on C4. Electronically Signed   By: Jearld Lesch M.D.   On: 02/21/2023 17:14   CT CERVICAL SPINE WO CONTRAST  Result Date: 02/21/2023 CLINICAL DATA:  Fall, laceration above right eye and lip EXAM: CT HEAD WITHOUT CONTRAST CT MAXILLOFACIAL WITHOUT CONTRAST CT CERVICAL SPINE WITHOUT CONTRAST TECHNIQUE: Multidetector CT imaging of the head, cervical spine, and maxillofacial structures were performed using the standard protocol without intravenous contrast. Multiplanar CT image reconstructions of the cervical spine and maxillofacial structures were also generated. RADIATION DOSE REDUCTION: This exam was performed according to the departmental dose-optimization program which includes automated exposure control, adjustment of the mA and/or kV according to patient size and/or use of iterative reconstruction technique. COMPARISON:  None Available. FINDINGS: CT HEAD FINDINGS Brain: No evidence of acute infarction, hemorrhage, hydrocephalus, extra-axial collection or mass lesion/mass effect. Periventricular and deep white matter hypodensity. Vascular: No hyperdense vessel or unexpected calcification. CT FACIAL BONES FINDINGS Skull: Normal. Negative for fracture or focal lesion. Facial bones: No displaced fractures or dislocations. Sinuses/Orbits: No acute finding. Other: Soft tissue contusion of the right brow (series 3, image 67). Soft tissue laceration of the right upper lip (series 9, image 22). CT CERVICAL SPINE FINDINGS Alignment: Degenerative straightening and reversal of the normal cervical lordosis. Degenerative anterolisthesis of C3 on C4. Skull base and vertebrae: No acute fracture. No primary bone lesion  or focal pathologic process. Soft tissues and spinal canal: No prevertebral fluid or swelling. No visible canal hematoma. 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 Upper chest: Negative. Other: None. IMPRESSION: 1. No acute intracranial pathology. Small-vessel white matter disease. 2. No displaced fractures or dislocations of the facial bones. 3. Soft tissue contusion of the right brow. Soft tissue laceration of the right upper lip. 4. No fracture or static subluxation of the cervical spine. 5. 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 Degenerative anterolisthesis of C3 on C4. Electronically Signed   By: Jearld Lesch M.D.   On: 02/21/2023 17:14   CT  Maxillofacial Wo Contrast  Result Date: 02/21/2023 CLINICAL DATA:  Fall, laceration above right eye and lip EXAM: CT HEAD WITHOUT CONTRAST CT MAXILLOFACIAL WITHOUT CONTRAST CT CERVICAL SPINE WITHOUT CONTRAST TECHNIQUE: Multidetector CT imaging of the head, cervical spine, and maxillofacial structures were performed using the standard protocol without intravenous contrast. Multiplanar CT image reconstructions of the cervical spine and maxillofacial structures were also generated. RADIATION DOSE REDUCTION: This exam was performed according to the departmental dose-optimization program which includes automated exposure control, adjustment of the mA and/or kV according to patient size and/or use of iterative reconstruction technique. COMPARISON:  None Available. FINDINGS: CT HEAD FINDINGS Brain: No evidence of acute infarction, hemorrhage, hydrocephalus, extra-axial collection or mass lesion/mass effect. Periventricular and deep white matter hypodensity. Vascular: No hyperdense vessel or unexpected calcification. CT FACIAL BONES FINDINGS Skull: Normal. Negative for fracture or focal lesion. Facial bones: No displaced fractures or dislocations. Sinuses/Orbits: No  acute finding. Other: Soft tissue contusion of the right brow (series 3, image 67). Soft tissue laceration of the right upper lip (series 9, image 22). CT CERVICAL SPINE FINDINGS Alignment: Degenerative straightening and reversal of the normal cervical lordosis. Degenerative anterolisthesis of C3 on C4. Skull base and vertebrae: No acute fracture. No primary bone lesion or focal pathologic process. Soft tissues and spinal canal: No prevertebral fluid or swelling. No visible canal hematoma. 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 Upper chest: Negative. Other: None. IMPRESSION: 1. No acute intracranial pathology. Small-vessel white matter disease. 2. No displaced fractures or dislocations of the facial bones. 3. Soft tissue contusion of the right brow. Soft tissue laceration of the right upper lip. 4. No fracture or static subluxation of the cervical spine. 5. 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 Degenerative anterolisthesis of C3 on C4. Electronically Signed   By: Jearld Lesch M.D.   On: 02/21/2023 17:14   DG Chest 1 View  Result Date: 02/21/2023 CLINICAL DATA:  Trauma. EXAM: CHEST  1 VIEW COMPARISON:  Chest radiograph 04/20/2022 FINDINGS: The cardiomediastinal contours are normal. Minor left lung base atelectasis. Pulmonary vasculature is normal. No consolidation, pleural effusion, or pneumothorax. No acute osseous abnormalities are seen. IMPRESSION: Minor left lung base atelectasis. Electronically Signed   By: Narda Rutherford M.D.   On: 02/21/2023 17:04   DG Pelvis 1-2 Views  Result Date: 02/21/2023 CLINICAL DATA:  Trauma, unwitnessed fall.  Hip pain. EXAM: PELVIS - 1-2 VIEW COMPARISON:  None Available. FINDINGS: The cortical margins of the bony pelvis are intact. No fracture. Pubic symphysis and sacroiliac joints are congruent. Intact pubic rami. Bilateral hip degenerative change. Both  femoral heads are well-seated in the respective acetabula. IMPRESSION: No pelvic fracture. Bilateral hip degenerative change. Electronically Signed   By: Narda Rutherford M.D.   On: 02/21/2023 17:02   DG Knee 2 Views Right  Result Date: 02/21/2023 CLINICAL DATA:  Fall. Knee replacement 2 weeks ago. Found on floor. EXAM: RIGHT KNEE - 1-2 VIEW COMPARISON:  None Available. FINDINGS: Right knee arthroplasty in expected alignment. No periprosthetic lucency or fracture. Patellar resurfacing. Minimal knee joint effusion. No erosive change unremarkable soft tissues. IMPRESSION: Right knee arthroplasty without complication. No fracture. Electronically Signed   By: Narda Rutherford M.D.   On: 02/21/2023 17:02    Procedures .Marland KitchenLaceration Repair  Date/Time: 02/21/2023 10:11 PM  Performed by: Gloris Manchester, MD Authorized by: Gloris Manchester, MD   Consent:    Consent obtained:  Verbal   Consent given  by:  Patient   Risks, benefits, and alternatives were discussed: yes     Risks discussed:  Pain, poor cosmetic result and infection   Alternatives discussed:  No treatment Universal protocol:    Procedure explained and questions answered to patient or proxy's satisfaction: yes     Patient identity confirmed:  Verbally with patient Anesthesia:    Anesthesia method:  Topical application and local infiltration   Topical anesthetic:  LET   Local anesthetic:  Lidocaine 2% WITH epi Laceration details:    Location:  Face   Face location:  R eyebrow   Length (cm):  2   Depth (mm):  2 Pre-procedure details:    Preparation:  Patient was prepped and draped in usual sterile fashion and imaging obtained to evaluate for foreign bodies Exploration:    Wound exploration: wound explored through full range of motion and entire depth of wound visualized   Treatment:    Area cleansed with:  Saline   Amount of cleaning:  Standard   Irrigation solution:  Sterile saline   Irrigation volume:  30cc   Irrigation method:   Syringe   Visualized foreign bodies/material removed: no   Skin repair:    Repair method:  Sutures   Suture size:  6-0   Suture material:  Nylon   Suture technique:  Simple interrupted   Number of sutures:  3 Approximation:    Approximation:  Close Repair type:    Repair type:  Simple Post-procedure details:    Dressing: Xeroform and Tegaderm.   Procedure completion:  Tolerated well, no immediate complications     Medications Ordered in ED Medications  lidocaine-EPINEPHrine-tetracaine (LET) topical gel (3 mLs Topical Given 02/21/23 2047)  acetaminophen (TYLENOL) tablet 650 mg (650 mg Oral Given 02/21/23 2046)  lidocaine-EPINEPHrine (XYLOCAINE W/EPI) 2 %-1:200000 (PF) injection 10 mL (10 mLs Intradermal Given 02/21/23 2046)    ED Course/ Medical Decision Making/ A&P                             Medical Decision Making Amount and/or Complexity of Data Reviewed Labs: ordered. Radiology: ordered. ECG/medicine tests: ordered.  Risk OTC drugs. Prescription drug management.   This patient presents to the ED for concern of fall, this involves an extensive number of treatment options, and is a complaint that carries with it a high risk of complications and morbidity.  The differential diagnosis includes acute injuries, syncope   Co morbidities that complicate the patient evaluation  arthritis, GERD, atrial flutter, HTN, mild cognitive impairment with memory loss   Additional history obtained:  Additional history obtained from EMS, patient's niece External records from outside source obtained and reviewed including EMR   Lab Tests:  I Ordered, and personally interpreted labs.  The pertinent results include: Decreased hemoglobin from baseline, no leukocytosis, normal kidney function, normal electrolytes, normal troponin, normal CK   Imaging Studies ordered:  I ordered imaging studies including CT of head, face, cervical spine; x-ray of chest, pelvis, right knee I  independently visualized and interpreted imaging which showed no acute findings I agree with the radiologist interpretation   Cardiac Monitoring: / EKG:  The patient was maintained on a cardiac monitor.  I personally viewed and interpreted the cardiac monitored which showed an underlying rhythm of: Sinus rhythm   Problem List / ED Course / Critical interventions / Medication management  Patient presents after an unwitnessed fall.  She is amnestic to the fall.  On arrival in the ED, she is awake and alert.  It does appear that she struck the right side of her face.  She has a hemostatic laceration to her right eyebrow and some swelling to right mouth area.  She has no jaw pain or malocclusion.  She underwent right knee arthroplasty 3 weeks ago.  She reports slightly worsened pain to the area of her right knee.  Tylenol was given for analgesia.  Let gel was applied to area of laceration.  Workup was initiated.  Patient underwent x-ray imaging of chest, pelvis, and right knee.  She underwent CT imaging of head, face, and cervical spine.  Results did not show any acute findings.  She remained in normal sinus rhythm.  She remained asymptomatic.  She was joined by her niece at bedside who confirms the patient is at her mental baseline.  Lab work was notable for a mild decrease in her hemoglobin over the past month.  Patient was advised to follow-up with PCP for repeat lab work.  3 sutures were placed, as per procedure note above.  Patient was discharged in stable condition. I ordered medication including Tylenol for analgesia; let gel and lidocaine for local anesthesia Reevaluation of the patient after these medicines showed that the patient improved I have reviewed the patients home medicines and have made adjustments as needed   Social Determinants of Health:  Has PCP        Final Clinical Impression(s) / ED Diagnoses Final diagnoses:  Fall, initial encounter  Laceration of right eyebrow,  initial encounter    Rx / DC Orders ED Discharge Orders     None         Gloris Manchester, MD 02/21/23 2216

## 2023-02-21 NOTE — Discharge Instructions (Signed)
The testing done today in the emergency department is reassuring. You did have a small drop in your hemoglobin when compared to lab work from a month ago.  You should follow-up with your PCP about this to repeat lab work and ensure that your anemia does not worsen.  You had 3 stitches placed in your right eyebrow area today.  These will need to be removed in 1 week.  Return to the emergency department if area becomes increasingly red, swollen, painful, or begins draining pus.  Return to the emergency department for any other new or worsening symptoms of concern.

## 2023-02-25 ENCOUNTER — Telehealth: Payer: Self-pay

## 2023-02-25 ENCOUNTER — Ambulatory Visit: Payer: Medicare Other | Admitting: Family

## 2023-02-25 ENCOUNTER — Encounter: Payer: Self-pay | Admitting: Family

## 2023-02-25 VITALS — BP 158/80 | HR 76 | Wt 170.6 lb

## 2023-02-25 DIAGNOSIS — Z4802 Encounter for removal of sutures: Secondary | ICD-10-CM | POA: Diagnosis not present

## 2023-02-25 DIAGNOSIS — D649 Anemia, unspecified: Secondary | ICD-10-CM

## 2023-02-25 DIAGNOSIS — K130 Diseases of lips: Secondary | ICD-10-CM

## 2023-02-25 MED ORDER — MUPIROCIN 2 % EX OINT
1.0000 | TOPICAL_OINTMENT | Freq: Two times a day (BID) | CUTANEOUS | 0 refills | Status: AC
Start: 2023-02-25 — End: 2023-03-11

## 2023-02-25 NOTE — Progress Notes (Signed)
Established Patient Office Visit  Subjective:      CC:  Chief Complaint  Patient presents with   suture removal     HPI: Mandy Castillo is a 82 y.o. female presenting on 02/25/2023 for suture removal  . Went to ER 02/21/23 for a unwittness fall. Pt did not remember the fall, LOC was unknown. She had a right forehead laceration. Not on blood thinners. With mild pain in front teeth upon awakening.   EKG in office with NSR  Laceration repair was placed 3 sutures in ER to right upper brow CT head face cervical spine, xfray chest pelvis and right knee. No acute findings per ER report  CBC mild decrease in hemoglobin over the last one month.  Was given tylenol for pain upon discharge.   Lab Results  Component Value Date   WBC 6.0 02/21/2023   HGB 10.9 (L) 02/21/2023   HCT 32.0 (L) 02/21/2023   MCV 91.8 02/21/2023   PLT 403 (H) 02/21/2023   Today she denies seen blood in stool but she does state saw might have seen some black stool about one week ago but not since. Denies fatigue. No palpitations or sob.   Last metabolic panel Lab Results  Component Value Date   GLUCOSE 96 02/21/2023   NA 139 02/21/2023   K 4.1 02/21/2023   CL 104 02/21/2023   CO2 27 02/21/2023   BUN 13 02/21/2023   CREATININE 0.90 02/21/2023   GFRNONAA >60 02/21/2023   CALCIUM 9.2 02/21/2023   PROT 6.9 02/21/2023   ALBUMIN 3.7 02/21/2023   BILITOT 0.6 02/21/2023   ALKPHOS 85 02/21/2023   AST 19 02/21/2023   ALT 17 02/21/2023   ANIONGAP 8 02/21/2023   Kidney function stable.        Social history:  Relevant past medical, surgical, family and social history reviewed and updated as indicated. Interim medical history since our last visit reviewed.  Allergies and medications reviewed and updated.  DATA REVIEWED: CHART IN EPIC     ROS: Negative unless specifically indicated above in HPI.    Current Outpatient Medications:    Apoaequorin (PREVAGEN) 10 MG CAPS, Take 10 mg by mouth  daily., Disp: , Rfl:    aspirin EC 325 MG tablet, Take 1 tablet (325 mg total) by mouth 2 (two) times daily after a meal. Take x 1 month post op to decrease risk of blood clots. (Patient taking differently: Take 325 mg by mouth daily. Take x 1 month post op to decrease risk of blood clots.), Disp: 60 tablet, Rfl: 0   atorvastatin (LIPITOR) 10 MG tablet, Take 1 tablet (10 mg total) by mouth daily., Disp: 90 tablet, Rfl: 3   celecoxib (CELEBREX) 100 MG capsule, Take 100 mg by mouth 2 (two) times daily., Disp: , Rfl:    Cholecalciferol 25 MCG (1000 UT) capsule, Take 1,000 Units by mouth daily., Disp: , Rfl:    Cyanocobalamin (B-12 PO), Take 1 tablet by mouth daily., Disp: , Rfl:    docusate sodium (COLACE) 100 MG capsule, Take 1 capsule (100 mg total) by mouth 2 (two) times daily as needed for mild constipation., Disp: 30 capsule, Rfl: 0   donepezil (ARICEPT) 10 MG tablet, Take 10 mg by mouth at bedtime., Disp: , Rfl:    metoprolol succinate (TOPROL-XL) 50 MG 24 hr tablet, Take 1 tablet (50 mg total) by mouth daily., Disp: 90 tablet, Rfl: 3   mupirocin ointment (BACTROBAN) 2 %, Apply 1 Application topically 2 (  two) times daily for 14 days., Disp: 22 g, Rfl: 0   omeprazole (PRILOSEC) 20 MG capsule, Take 1 capsule (20 mg total) by mouth daily., Disp: 90 capsule, Rfl: 3   oxyCODONE-acetaminophen (PERCOCET/ROXICET) 5-325 MG tablet, Take 1-2 tablets by mouth every 6 (six) hours as needed for severe pain., Disp: 40 tablet, Rfl: 0   tiZANidine (ZANAFLEX) 2 MG tablet, Take 1 tablet (2 mg total) by mouth every 8 (eight) hours as needed for muscle spasms., Disp: 40 tablet, Rfl: 0      Objective:    BP (!) 158/80   Pulse 76   Wt 170 lb 9.6 oz (77.4 kg)   SpO2 99%   BMI 25.94 kg/m   Wt Readings from Last 3 Encounters:  02/25/23 170 lb 9.6 oz (77.4 kg)  01/31/23 166 lb (75.3 kg)  01/18/23 166 lb (75.3 kg)    Physical Exam Constitutional:      General: She is not in acute distress.    Appearance:  Normal appearance. She is normal weight. She is not ill-appearing, toxic-appearing or diaphoretic.  HENT:     Head: Normocephalic.  Cardiovascular:     Rate and Rhythm: Normal rate.  Pulmonary:     Effort: Pulmonary effort is normal.  Musculoskeletal:        General: Normal range of motion.  Skin:    Comments: Right brow line at lateral edge healing and closed no surrounding erythema no discharge at site with three sutures intact.    Neurological:     General: No focal deficit present.     Mental Status: She is alert and oriented to person, place, and time. Mental status is at baseline.  Psychiatric:        Mood and Affect: Mood normal.        Behavior: Behavior normal.        Thought Content: Thought content normal.        Judgment: Judgment normal.             Assessment & Plan:  Anemia, unspecified type Assessment & Plan: New onset.  Ordering fobt to r/o melena Repeat ibc cbc x 2 weeks F/u with one month  Advised red flag symptoms   Orders: -     CBC with Differential/Platelet; Future -     IBC + Ferritin; Future -     Fecal occult blood, imunochemical; Future  Chapped lips Assessment & Plan: Rx mupirocin ointment x 7 days  Use aquaphor at night time.   Orders: -     Mupirocin; Apply 1 Application topically 2 (two) times daily for 14 days.  Dispense: 22 g; Refill: 0  Visit for suture removal Assessment & Plan: Three Sutures removed with sterile suture kit, after pt consent obtained.  Betadine applied prior to removal. Pt tolerated procedure well. Six intact sutures removed without complication.        Return in about 1 month (around 03/28/2023) for f/u anemia.  Mort Sawyers, MSN, APRN, FNP-C [facility]

## 2023-02-25 NOTE — Assessment & Plan Note (Signed)
Rx mupirocin ointment x 7 days  Use aquaphor at night time.

## 2023-02-25 NOTE — Assessment & Plan Note (Signed)
New onset.  Ordering fobt to r/o melena Repeat ibc cbc x 2 weeks F/u with one month  Advised red flag symptoms

## 2023-02-25 NOTE — Patient Instructions (Addendum)
   Get some Aquaphor ointment for your lips put on nightly  RX sent for ointment use twice dialy for at least one week.    Regards,   Mort Sawyers FNP-C

## 2023-02-25 NOTE — Telephone Encounter (Signed)
Transition Care Management Follow-up Telephone Call Date of discharge and from where: 02/21/2023 [facility] How have you been since you were released from the hospital? Patient is feeling better and had her sutures removed today. Any questions or concerns? No  Items Reviewed: Did the pt receive and understand the discharge instructions provided? Yes  Medications obtained and verified?  No medication prescribed. Other? No  Any new allergies since your discharge? No  Dietary orders reviewed? Yes Do you have support at home? Yes   Follow up appointments reviewed:  PCP Hospital f/u appt confirmed? Yes  Scheduled to see Mort Sawyers, FNP on 02/25/2023 @ [facility]ist [facility] f/u appt confirmed? No  Scheduled to see  on  @ . Are transportation arrangements needed? No  If their condition worsens, is the pt aware to call PCP or go to the Emergency Dept.? Yes Was the patient provided with contact information for the PCP's office or ED? Yes Was to pt encouraged to call back with questions or concerns? Yes   [facility]  [facility] Population Health Community Resource Care Guide   [email or web address]  ?? [phone]   Website: [email or web address]  [email or web address]

## 2023-02-25 NOTE — Assessment & Plan Note (Signed)
Three Sutures removed with sterile suture kit, after pt consent obtained.  Betadine applied prior to removal. Pt tolerated procedure well. Six intact sutures removed without complication.

## 2023-03-01 ENCOUNTER — Other Ambulatory Visit: Payer: Self-pay

## 2023-03-01 ENCOUNTER — Telehealth: Payer: Self-pay

## 2023-03-01 DIAGNOSIS — M6281 Muscle weakness (generalized): Secondary | ICD-10-CM | POA: Diagnosis not present

## 2023-03-01 DIAGNOSIS — D649 Anemia, unspecified: Secondary | ICD-10-CM

## 2023-03-01 DIAGNOSIS — M25661 Stiffness of right knee, not elsewhere classified: Secondary | ICD-10-CM | POA: Diagnosis not present

## 2023-03-01 DIAGNOSIS — Z96651 Presence of right artificial knee joint: Secondary | ICD-10-CM | POA: Diagnosis not present

## 2023-03-01 NOTE — Telephone Encounter (Signed)
Received a phone call from Jacki Cones in the lab that pt's ifob would need to be re-collected due to being covered in stool.

## 2023-03-02 ENCOUNTER — Other Ambulatory Visit: Payer: Self-pay | Admitting: Family

## 2023-03-02 DIAGNOSIS — D649 Anemia, unspecified: Secondary | ICD-10-CM

## 2023-03-03 DIAGNOSIS — Z96651 Presence of right artificial knee joint: Secondary | ICD-10-CM | POA: Diagnosis not present

## 2023-03-03 DIAGNOSIS — M6281 Muscle weakness (generalized): Secondary | ICD-10-CM | POA: Diagnosis not present

## 2023-03-03 DIAGNOSIS — M25661 Stiffness of right knee, not elsewhere classified: Secondary | ICD-10-CM | POA: Diagnosis not present

## 2023-03-04 ENCOUNTER — Other Ambulatory Visit: Payer: Self-pay

## 2023-03-04 DIAGNOSIS — D649 Anemia, unspecified: Secondary | ICD-10-CM

## 2023-03-08 DIAGNOSIS — M6281 Muscle weakness (generalized): Secondary | ICD-10-CM | POA: Diagnosis not present

## 2023-03-08 DIAGNOSIS — Z96651 Presence of right artificial knee joint: Secondary | ICD-10-CM | POA: Diagnosis not present

## 2023-03-08 DIAGNOSIS — M25661 Stiffness of right knee, not elsewhere classified: Secondary | ICD-10-CM | POA: Diagnosis not present

## 2023-03-10 DIAGNOSIS — M6281 Muscle weakness (generalized): Secondary | ICD-10-CM | POA: Diagnosis not present

## 2023-03-10 DIAGNOSIS — M25661 Stiffness of right knee, not elsewhere classified: Secondary | ICD-10-CM | POA: Diagnosis not present

## 2023-03-10 DIAGNOSIS — Z96651 Presence of right artificial knee joint: Secondary | ICD-10-CM | POA: Diagnosis not present

## 2023-03-15 DIAGNOSIS — M25661 Stiffness of right knee, not elsewhere classified: Secondary | ICD-10-CM | POA: Diagnosis not present

## 2023-03-15 DIAGNOSIS — Z96651 Presence of right artificial knee joint: Secondary | ICD-10-CM | POA: Diagnosis not present

## 2023-03-15 DIAGNOSIS — M6281 Muscle weakness (generalized): Secondary | ICD-10-CM | POA: Diagnosis not present

## 2023-03-17 DIAGNOSIS — Z96651 Presence of right artificial knee joint: Secondary | ICD-10-CM | POA: Diagnosis not present

## 2023-03-17 DIAGNOSIS — M6281 Muscle weakness (generalized): Secondary | ICD-10-CM | POA: Diagnosis not present

## 2023-03-17 DIAGNOSIS — M25561 Pain in right knee: Secondary | ICD-10-CM | POA: Diagnosis not present

## 2023-03-17 DIAGNOSIS — M25661 Stiffness of right knee, not elsewhere classified: Secondary | ICD-10-CM | POA: Diagnosis not present

## 2023-03-27 ENCOUNTER — Other Ambulatory Visit: Payer: Self-pay | Admitting: Cardiovascular Disease

## 2023-04-13 ENCOUNTER — Other Ambulatory Visit: Payer: Self-pay

## 2023-04-13 ENCOUNTER — Encounter: Payer: Self-pay | Admitting: Physician Assistant

## 2023-04-13 ENCOUNTER — Emergency Department
Admission: EM | Admit: 2023-04-13 | Discharge: 2023-04-13 | Disposition: A | Discharge status: Home / Self Care | Payer: Medicare Other | Attending: Emergency Medicine | Admitting: Emergency Medicine

## 2023-04-13 ENCOUNTER — Emergency Department: Payer: Medicare Other

## 2023-04-13 ENCOUNTER — Telehealth: Payer: Self-pay

## 2023-04-13 DIAGNOSIS — K219 Gastro-esophageal reflux disease without esophagitis: Secondary | ICD-10-CM | POA: Insufficient documentation

## 2023-04-13 DIAGNOSIS — Z743 Need for continuous supervision: Secondary | ICD-10-CM | POA: Diagnosis not present

## 2023-04-13 DIAGNOSIS — R350 Frequency of micturition: Secondary | ICD-10-CM | POA: Diagnosis not present

## 2023-04-13 DIAGNOSIS — R109 Unspecified abdominal pain: Secondary | ICD-10-CM | POA: Diagnosis not present

## 2023-04-13 DIAGNOSIS — R110 Nausea: Secondary | ICD-10-CM | POA: Insufficient documentation

## 2023-04-13 DIAGNOSIS — R1084 Generalized abdominal pain: Secondary | ICD-10-CM | POA: Diagnosis not present

## 2023-04-13 DIAGNOSIS — Z9049 Acquired absence of other specified parts of digestive tract: Secondary | ICD-10-CM | POA: Diagnosis not present

## 2023-04-13 DIAGNOSIS — I10 Essential (primary) hypertension: Secondary | ICD-10-CM | POA: Insufficient documentation

## 2023-04-13 DIAGNOSIS — R6889 Other general symptoms and signs: Secondary | ICD-10-CM | POA: Diagnosis not present

## 2023-04-13 LAB — COMPREHENSIVE METABOLIC PANEL WITH GFR
ALT: 16 U/L (ref 0–44)
AST: 22 U/L (ref 15–41)
Albumin: 4.5 g/dL (ref 3.5–5.0)
Alkaline Phosphatase: 84 U/L (ref 38–126)
Anion gap: 9 (ref 5–15)
BUN: 14 mg/dL (ref 8–23)
CO2: 26 mmol/L (ref 22–32)
Calcium: 9.5 mg/dL (ref 8.9–10.3)
Chloride: 101 mmol/L (ref 98–111)
Creatinine, Ser: 0.77 mg/dL (ref 0.44–1.00)
GFR, Estimated: >60 mL/min (ref >60)
Glucose, Bld: 153 mg/dL — ABNORMAL HIGH (ref 70–99)
Potassium: 4.1 mmol/L (ref 3.5–5.1)
Sodium: 136 mmol/L (ref 135–145)
Total Bilirubin: 0.8 mg/dL (ref 0.3–1.2)
Total Protein: 8.2 g/dL — ABNORMAL HIGH (ref 6.5–8.1)

## 2023-04-13 LAB — LIPASE, BLOOD: Lipase: 21 U/L (ref 11–51)

## 2023-04-13 LAB — URINALYSIS, ROUTINE W REFLEX MICROSCOPIC
Bilirubin Urine: NEGATIVE
Glucose, UA: NEGATIVE mg/dL
Hgb urine dipstick: NEGATIVE
Ketones, ur: NEGATIVE mg/dL
Leukocytes,Ua: NEGATIVE
Nitrite: NEGATIVE
Protein, ur: NEGATIVE mg/dL
Specific Gravity, Urine: 1.009 (ref 1.005–1.030)
pH: 8 (ref 5.0–8.0)

## 2023-04-13 LAB — CBC
HCT: 41.5 % (ref 36.0–46.0)
Hemoglobin: 13.5 g/dL (ref 12.0–15.0)
MCH: 28.2 pg (ref 26.0–34.0)
MCHC: 32.5 g/dL (ref 30.0–36.0)
MCV: 86.8 fL (ref 80.0–100.0)
Platelets: 321 10*3/uL (ref 150–400)
RBC: 4.78 MIL/uL (ref 3.87–5.11)
RDW: 12.8 % (ref 11.5–15.5)
WBC: 6.7 10*3/uL (ref 4.0–10.5)
nRBC: 0 % (ref 0.0–0.2)

## 2023-04-13 MED ORDER — IOHEXOL 300 MG/ML  SOLN
100.0000 mL | Freq: Once | INTRAMUSCULAR | Status: AC | PRN
  Administered 2023-04-13: 100 mL via INTRAVENOUS

## 2023-04-13 MED ORDER — ONDANSETRON HCL 4 MG/2ML IJ SOLN
4.0000 mg | Freq: Once | INTRAMUSCULAR | Status: AC
  Administered 2023-04-13: 4 mg via INTRAVENOUS
  Filled 2023-04-13: qty 2

## 2023-04-13 MED ORDER — ONDANSETRON 4 MG PO TBDP: 4.0000 mg | ORAL_TABLET | Freq: Three times a day (TID) | ORAL | 0 refills | Status: AC | PRN

## 2023-04-13 NOTE — ED Provider Notes (Signed)
[facility] Emergency Department Provider Note     Event Date/Time   First MD Initiated Contact with Patient 04/13/23 1754     (approximate)   History   Abdominal Pain and Nausea  HPI  Mandy Castillo is a 82 y.o. female with a history of osteo, HTN, cognitive impairment, GERD, a flutter, presents to the ED via EMS from home.  Patient would endorse some intermittent periumbilical abdominal pain with nausea since this morning.  She ate a small breakfast meal but denies any frank vomiting.  She would also endorse some urinary frequency as well as some loose stools this morning but denies any watery diarrhea.  Patient denies any fevers, chills, sweats, chest pain, shortness of breath.  She does endorse some anorexia and reports only mild abdominal pain at time of this interval evaluation.  She is status post appendectomy, total hysterectomy, and cholecystectomy.  Physical Exam   Triage Vital Signs: ED Triage Vitals  Encounter Vitals Group     BP 04/13/23 1636 (!) 197/86     Systolic BP Percentile --      Diastolic BP Percentile --      Pulse Rate 04/13/23 1635 62     Resp 04/13/23 1635 18     Temp 04/13/23 1635 98.4 F (36.9 C)     Temp Source 04/13/23 1635 Oral     SpO2 04/13/23 1635 100 %     Weight 04/13/23 1634 170 lb (77.1 kg)     Height 04/13/23 1634 5\' 8"  (1.727 m)     Head Circumference --      Peak Flow --      Pain Score 04/13/23 1634 5     Pain Loc --      Pain Education --      Exclude from Growth Chart --     Most recent vital signs: Vitals:   04/13/23 1636 04/13/23 1948  BP: (!) 197/86 (!) 176/70  Pulse:  64  Resp:    Temp:    SpO2:  99%    General Awake, no distress.  NAD HEENT NCAT. PERRL. EOMI. No rhinorrhea. Mucous membranes are moist.  CV:  Good peripheral perfusion.  RRR RESP:  Normal effort.  CTA ABD:  No distention.  Mild tender to gentle palpation of the abdominal quadrants.  No rebound, guarding, or rigidity  noted.  Normoactive bowel sounds appreciated.   ED Results / Procedures / Treatments   Labs (all labs ordered are listed, but only abnormal results are displayed) Labs Reviewed  COMPREHENSIVE METABOLIC PANEL - Abnormal; Notable for the following components:      Result Value   Glucose, Bld 153 (*)    Total Protein 8.2 (*)    All other components within normal limits  URINALYSIS, ROUTINE W REFLEX MICROSCOPIC - Abnormal; Notable for the following components:   Color, Urine STRAW (*)    APPearance CLEAR (*)    All other components within normal limits  LIPASE, BLOOD  CBC     EKG  RADIOLOGY  I personally viewed and evaluated these images as part of my medical decision making, as well as reviewing the written report by the radiologist.  ED Provider Interpretation: No acute findings  CT ABDOMEN PELVIS W CONTRAST  Result Date: 04/13/2023 CLINICAL DATA:  Acute generalized abdominal pain for 2 days, nausea. EXAM: CT ABDOMEN AND PELVIS WITH CONTRAST TECHNIQUE: Multidetector CT imaging of the abdomen and pelvis was performed using the standard protocol following  bolus administration of intravenous contrast. RADIATION DOSE REDUCTION: This exam was performed according to the departmental dose-optimization program which includes automated exposure control, adjustment of the mA and/or kV according to patient size and/or use of iterative reconstruction technique. CONTRAST:  OMNIPAQUE IOHEXOL 300 MG/ML  SOLN COMPARISON:  None Available. FINDINGS: Lower chest: No acute abnormality. Hepatobiliary: No focal liver abnormality is seen. Status post cholecystectomy. No biliary dilatation. Pancreas: Unremarkable. No pancreatic ductal dilatation or surrounding inflammatory changes. Spleen: Normal in size without focal abnormality. Adrenals/Urinary Tract: Adrenal glands are unremarkable. Kidneys are normal, without renal calculi, focal lesion, or hydronephrosis. Bladder is unremarkable. Stomach/Bowel:  Stomach is unremarkable. Status post appendectomy. No evidence of bowel obstruction or inflammation. Vascular/Lymphatic: Aortic atherosclerosis. No enlarged abdominal or pelvic lymph nodes. Reproductive: Status post hysterectomy. No adnexal masses. Other: No abdominal wall hernia or abnormality. No abdominopelvic ascites. Musculoskeletal: No acute or significant osseous findings. IMPRESSION: No acute abnormality seen in the abdomen or pelvis. Aortic Atherosclerosis (ICD10-I70.0). Electronically Signed   By: Lupita Raider M.D.   On: 04/13/2023 19:35     PROCEDURES:  Critical Care performed: No  Procedures   MEDICATIONS ORDERED IN ED: Medications  ondansetron (ZOFRAN) injection 4 mg (4 mg Intravenous Given 04/13/23 1840)  iohexol (OMNIPAQUE) 300 MG/ML solution 100 mL (100 mLs Intravenous Contrast Given 04/13/23 1856)     IMPRESSION / MDM / ASSESSMENT AND PLAN / ED COURSE  I reviewed the triage vital signs and the nursing notes.                              Differential diagnosis includes, but is not limited to, acute appendicitis, diverticulitis, urinary tract infection/pyelonephritis, endometriosis, bowel obstruction, colitis, renal colic, gastroenteritis, hernia, etc.  Patient's presentation is most consistent with acute complicated illness / injury requiring diagnostic workup.  Patient's diagnosis is consistent with generalized abdominal pain with unclear etiology.  Symptoms may be due to an acute gastroenteritis infection.  Labs are reassuring with no acute leukocytosis, critical anemia, electrolyte abnormality.  No UA evidence of UTI.  CT without evidence of an intrathoracic process based on my interpretation.  Patient will be discharged home with prescriptions for Zofran. Patient is to follow up with primary provider as discussed, as needed or otherwise directed. Patient is given ED precautions to return to the ED for any worsening or new symptoms.  FINAL CLINICAL IMPRESSION(S) / ED  DIAGNOSES   Final diagnoses:  Generalized abdominal pain     Rx / DC Orders   ED Discharge Orders          Ordered    ondansetron (ZOFRAN-ODT) 4 MG disintegrating tablet  Every 8 hours PRN        04/13/23 2001             Note:  This document was prepared using Dragon voice recognition software and may include unintentional dictation errors.    Lissa Hoard, PA-C 04/13/23 2346    Chesley Noon, MD 04/14/23 1510

## 2023-04-13 NOTE — ED Triage Notes (Signed)
Pt arrives via [facility] for periumbilical abdominal pain with nausea since this morning. Pt reports multiple surgeries to include appendectomy, hysterectomy, and cholecystectomy. Pt reports urinary frequency, denies pain with urination.

## 2023-04-13 NOTE — Telephone Encounter (Signed)
Pt walked in ; no available appts at [facility]. On 04/12/23 pt had rt upper abd cramping; pain level 5; pt does not have GB. All day today 04/13/23 pt has had more severe dull constant pain in upper abd. Now pain level 7 - 8 pt has had nausea on and off all day today. No vomiting. Pt has had 3 normal BMs today with no blood or mucus seen. No diarrhea. Pt is belching a lot. Pt is lightheaded today and blurred vision. BP at home 210/100 today. 3 days ago BP at home was 135/79. Now BP 200/100 T 97.8 P 62 oxygen level 97% room air.  No CP,SOB or H/A. Pt said last couple of days pt has urgency and frequency of urine. No dark urine. No lower abd pain and no back pain. No burning or pain upon urination. Pt developed 5 min episode of more severe upper abd pain,pt became more nauseated but did not vomit and pt did break out in sweat. Reck BP 220/100. Pt's partner request EMS to take pt to [facility] ED. Audria Nine NP said with symptoms appropriate to go to ED. EMS arrived and transported pt to ED.sending note to Audria Nine NP.

## 2023-04-13 NOTE — ED Notes (Signed)
First Nurse note-pt brought in via ems from doctor's office.  Pt has abd pain for 2 days with nausea.  Bp elevated no bp meds.  220/110, pulse 58, oxygen sats 98% rm air per EMS  pt in wheelchair in lobby.   Pt alert

## 2023-04-13 NOTE — Discharge Instructions (Signed)
Your exam, labs, and CT scans are normal and reassuring at this time. Take the nausea medicine as needed.

## 2023-04-14 NOTE — Telephone Encounter (Signed)
Thank you I appreciate your prompt assessment. Thank you Mordecai Maes for the direction.

## 2023-04-25 ENCOUNTER — Other Ambulatory Visit: Payer: Self-pay | Admitting: Cardiovascular Disease

## 2023-04-28 ENCOUNTER — Other Ambulatory Visit: Payer: Self-pay

## 2023-04-29 ENCOUNTER — Encounter: Payer: Self-pay | Admitting: Family

## 2023-04-29 ENCOUNTER — Ambulatory Visit (INDEPENDENT_AMBULATORY_CARE_PROVIDER_SITE_OTHER): Payer: Medicare Other | Admitting: Family

## 2023-04-29 VITALS — BP 134/72 | HR 74 | Temp 98.4°F | Ht 68.0 in | Wt 171.0 lb

## 2023-04-29 DIAGNOSIS — Z23 Encounter for immunization: Secondary | ICD-10-CM | POA: Diagnosis not present

## 2023-04-29 DIAGNOSIS — Z780 Asymptomatic menopausal state: Secondary | ICD-10-CM | POA: Diagnosis not present

## 2023-04-29 DIAGNOSIS — G3184 Mild cognitive impairment, so stated: Secondary | ICD-10-CM

## 2023-04-29 DIAGNOSIS — Z9181 History of falling: Secondary | ICD-10-CM | POA: Diagnosis not present

## 2023-04-29 DIAGNOSIS — H903 Sensorineural hearing loss, bilateral: Secondary | ICD-10-CM | POA: Insufficient documentation

## 2023-04-29 NOTE — Assessment & Plan Note (Signed)
Continue aricept 10 mg once daily  Call neurology to schedule f/u  Appears stable

## 2023-04-29 NOTE — Progress Notes (Signed)
Established Patient Office Visit  Subjective:      CC:  Chief Complaint  Patient presents with   Medical Management of Chronic Issues    Wants to discuss a neurology referral and getting a bone density ordered. Feels like she is having issues with her hearing as well.    HPI: Mandy Castillo is a 82 y.o. female presenting on 04/29/2023 for Medical Management of Chronic Issues (Wants to discuss a neurology referral and getting a bone density ordered. Feels like she is having issues with her hearing as well.) . Did go to ER 9/18 at [facility] for abdominal pain with nausea.  CT abd pelvis no acute abn, aortic atherosclerosis noted.  Given zofran and discharged. Per note suspected GI virus.   Cognitive impairment, still with significant memory loss, but she does state this feels stable as per now. She is established with a neurologist but has not been back since 01/29/22, with Dr. Nilda Calamity. She is on aricept 10 mg daily. She doesn't currently drive very much, she is driven around mainly by her partner. She does have trouble remembering names at times, short memory loss seems worse. One recent fall within the last 6 months. She can perform her IADLs , she can cook clean and manage her finances.  New complaints: Sometimes she feels it is hard to hear and she feels sounds are muffled.      Social history:  Relevant past medical, surgical, family and social history reviewed and updated as indicated. Interim medical history since our last visit reviewed.  Allergies and medications reviewed and updated.  DATA REVIEWED: CHART IN EPIC     ROS: Negative unless specifically indicated above in HPI.    Current Outpatient Medications:    Apoaequorin (PREVAGEN) 10 MG CAPS, Take 10 mg by mouth daily., Disp: , Rfl:    aspirin EC 325 MG tablet, Take 1 tablet (325 mg total) by mouth 2 (two) times daily after a meal. Take x 1 month post op to decrease risk of blood clots. (Patient  taking differently: Take 325 mg by mouth daily. Take x 1 month post op to decrease risk of blood clots.), Disp: 60 tablet, Rfl: 0   atorvastatin (LIPITOR) 10 MG tablet, Take 1 tablet (10 mg total) by mouth daily., Disp: 90 tablet, Rfl: 3   celecoxib (CELEBREX) 100 MG capsule, Take 100 mg by mouth 2 (two) times daily., Disp: , Rfl:    Cholecalciferol 25 MCG (1000 UT) capsule, Take 1,000 Units by mouth daily., Disp: , Rfl:    Cyanocobalamin (B-12 PO), Take 1 tablet by mouth daily., Disp: , Rfl:    docusate sodium (COLACE) 100 MG capsule, Take 1 capsule (100 mg total) by mouth 2 (two) times daily as needed for mild constipation., Disp: 30 capsule, Rfl: 0   donepezil (ARICEPT) 10 MG tablet, Take 10 mg by mouth at bedtime., Disp: , Rfl:    HYDROcodone-acetaminophen (NORCO/VICODIN) 5-325 MG tablet, Take 1 tablet by mouth every 6 (six) hours as needed., Disp: , Rfl:    metoprolol succinate (TOPROL-XL) 50 MG 24 hr tablet, TAKE 1 TABLET BY MOUTH DAILY, Disp: 15 tablet, Rfl: 0   omeprazole (PRILOSEC) 20 MG capsule, Take 1 capsule (20 mg total) by mouth daily., Disp: 90 capsule, Rfl: 3   ondansetron (ZOFRAN-ODT) 4 MG disintegrating tablet, Take 1 tablet (4 mg total) by mouth every 8 (eight) hours as needed for nausea or vomiting., Disp: 15 tablet, Rfl: 0   oxyCODONE (OXY  IR/ROXICODONE) 5 MG immediate release tablet, Take 5-10 mg by mouth every 4 (four) hours as needed., Disp: , Rfl:    oxyCODONE-acetaminophen (PERCOCET/ROXICET) 5-325 MG tablet, Take 1-2 tablets by mouth every 6 (six) hours as needed for severe pain., Disp: 40 tablet, Rfl: 0   tiZANidine (ZANAFLEX) 2 MG tablet, Take 1 tablet (2 mg total) by mouth every 8 (eight) hours as needed for muscle spasms., Disp: 40 tablet, Rfl: 0      Objective:    BP 134/72   Pulse 74   Temp 98.4 F (36.9 C) (Temporal)   Ht 5\' 8"  (1.727 m)   Wt 171 lb (77.6 kg)   SpO2 100%   BMI 26.00 kg/m   Wt Readings from Last 3 Encounters:  04/29/23 171 lb (77.6 kg)   04/13/23 170 lb (77.1 kg)  02/25/23 170 lb 9.6 oz (77.4 kg)    Physical Exam Constitutional:      General: She is not in acute distress.    Appearance: Normal appearance. She is normal weight. She is not ill-appearing, toxic-appearing or diaphoretic.  HENT:     Head: Normocephalic.     Right Ear: Tympanic membrane normal.     Left Ear: Tympanic membrane normal.     Nose: Nose normal.     Mouth/Throat:     Mouth: Mucous membranes are dry.     Pharynx: No oropharyngeal exudate or posterior oropharyngeal erythema.  Eyes:     Extraocular Movements: Extraocular movements intact.     Pupils: Pupils are equal, round, and reactive to light.  Cardiovascular:     Rate and Rhythm: Normal rate and regular rhythm.     Pulses: Normal pulses.     Heart sounds: Normal heart sounds.  Pulmonary:     Effort: Pulmonary effort is normal.     Breath sounds: Normal breath sounds.  Musculoskeletal:     Cervical back: Normal range of motion.  Neurological:     General: No focal deficit present.     Mental Status: She is alert and oriented to person, place, and time. Mental status is at baseline.  Psychiatric:        Mood and Affect: Mood normal.        Behavior: Behavior normal.        Thought Content: Thought content normal.        Judgment: Judgment normal.           Assessment & Plan:  Sensorineural hearing loss (SNHL) of both ears Assessment & Plan: Referral to audiology  Assess for hearing aids  Orders: -     Ambulatory referral to Audiology  Mild cognitive impairment with memory loss Assessment & Plan: Continue aricept 10 mg once daily  Call neurology to schedule f/u  Appears stable    History of recent fall  Postmenopausal  Encounter for immunization -     Flu Vaccine Trivalent High Dose (Fluad)     Return in about 6 months (around 10/28/2023) for f/u cholesterol.  Mort Sawyers, MSN, APRN, FNP-C [facility]

## 2023-04-29 NOTE — Assessment & Plan Note (Signed)
Referral to audiology  Assess for hearing aids

## 2023-04-29 NOTE — Patient Instructions (Addendum)
  Call neurology to schedule a one year follow up:  Gelene Mink, PA  [address]  [place], [place] [address]  [phone] (Work)  [phone] (Fax)   ------------------------------------  A referral was placed today for hearing loss. Please let us know if you have not heard back within 2 weeks about the referral.  ------------------------------------  I have sent an electronic order over to your preferred location for the following:   []   2D Mammogram  []   3D Mammogram  [x]   Bone Density   Please give this center a call to get scheduled at your convenience.  [x]   [facility]  [address] [place] [address]  [phone]  ------------------------------------

## 2023-05-04 DIAGNOSIS — R413 Other amnesia: Secondary | ICD-10-CM | POA: Diagnosis not present

## 2023-05-05 ENCOUNTER — Other Ambulatory Visit: Payer: Self-pay | Admitting: Cardiovascular Disease

## 2023-05-07 IMAGING — DX DG LUMBAR SPINE COMPLETE 4+V
5 series · 5 of 5 positions shown · non-contrast
Comparison: None.

CLINICAL DATA: Evaluate for fracture.  Fall.

EXAM:
LUMBAR SPINE - COMPLETE 4+ VIEW

[lumbar spine ap]
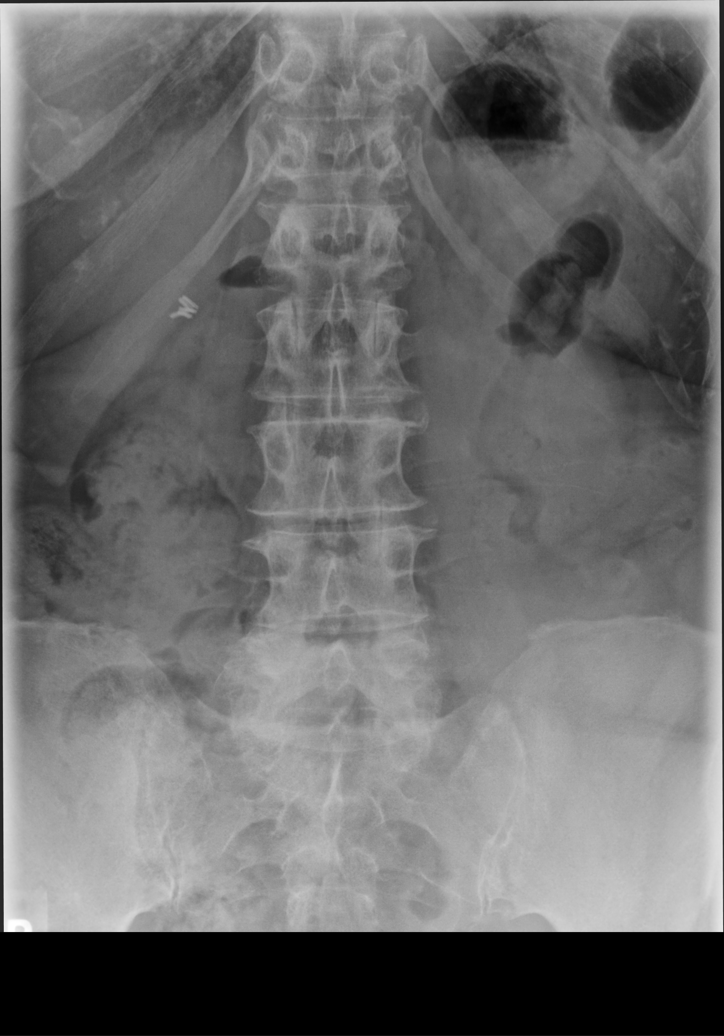

[lumbar spine lmo (1 of 2)]
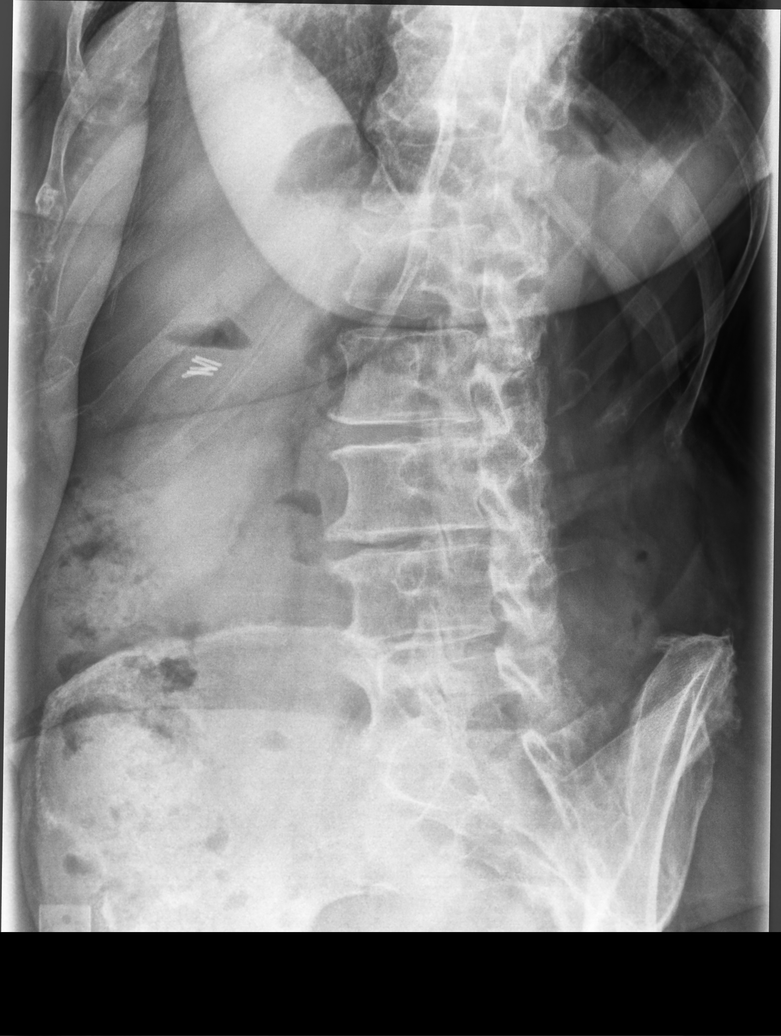

[lumbar spine lmo (2 of 2)]
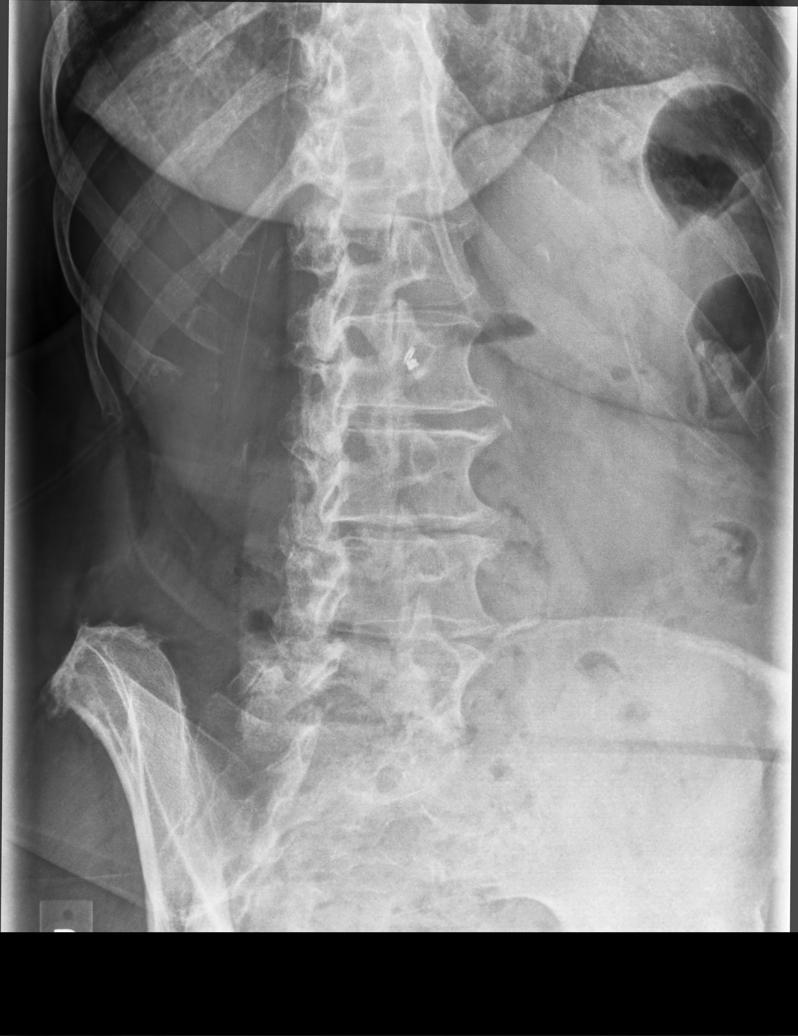

[lumbar spine lat (1 of 2)]
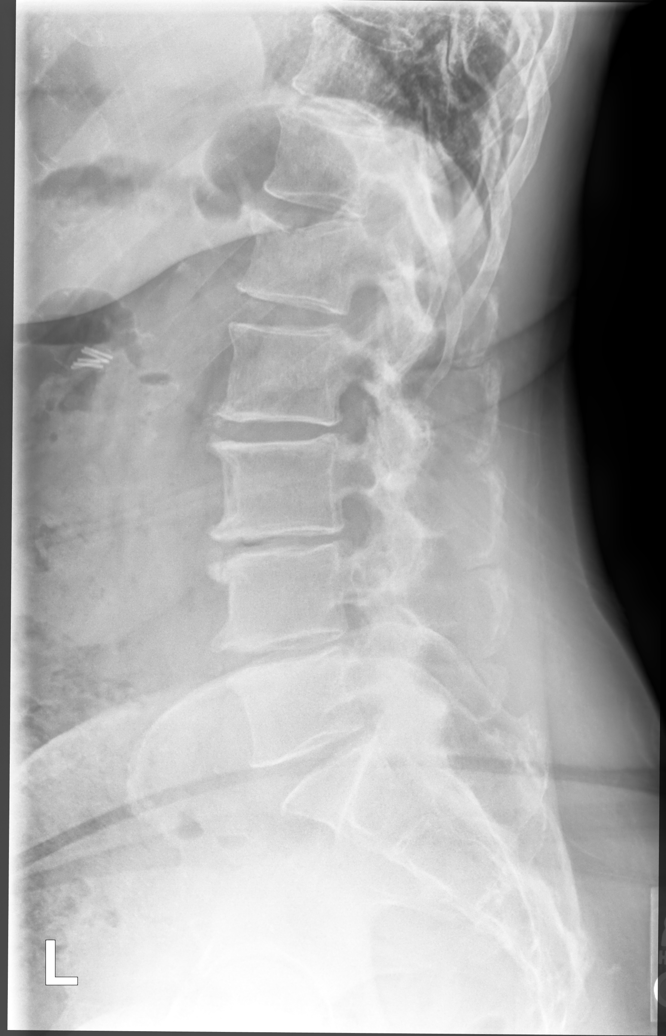

[lumbar spine lat (2 of 2)]
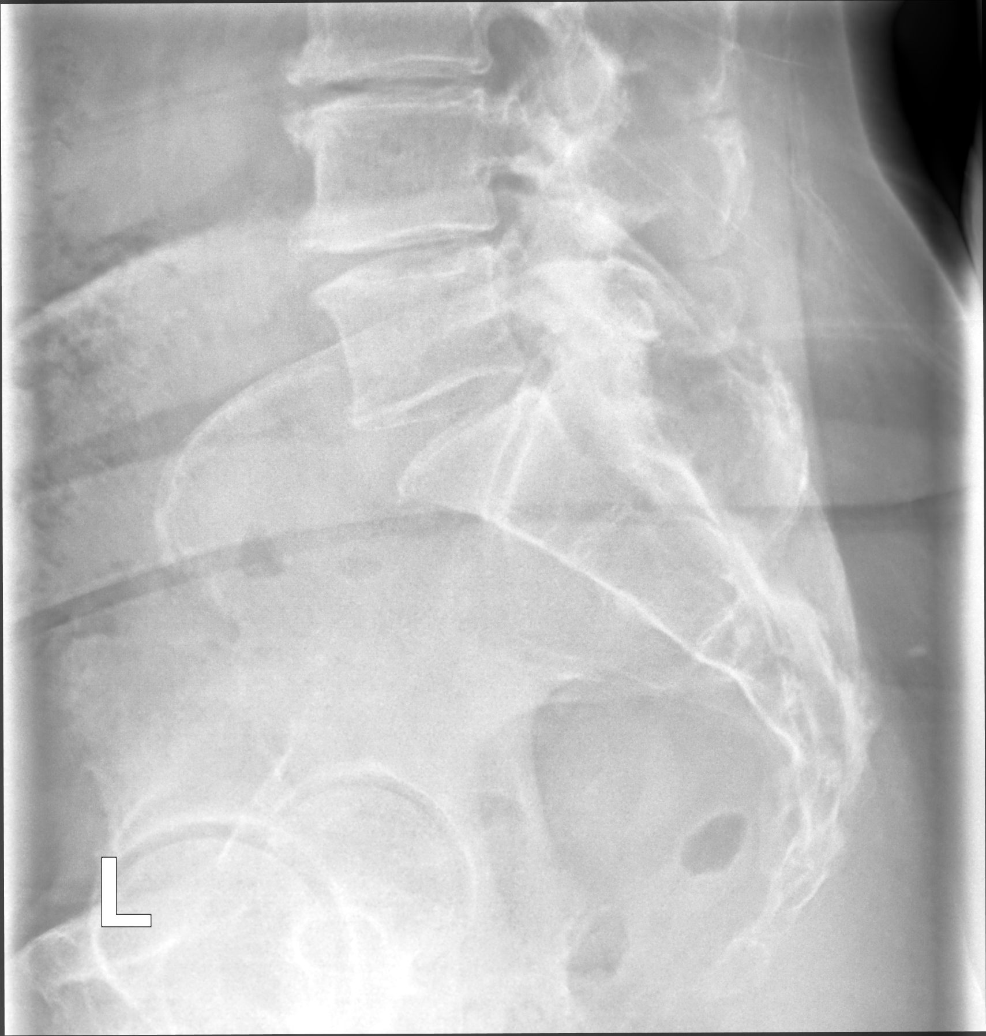

[5 of 5 positions shown; findings below may reference images not displayed]

FINDINGS: There is mild compression deformity of the superior endplate of L1
with 10% loss vertebral body height. No definite retropulsion of
fracture fragments. Alignment is normal. There is mild disc space
narrowing at L3-L4, L4-L5 and L5-S1. Degenerative endplate
osteophytes are seen throughout. There are degenerative changes of
facet joints at L5-S1. Cholecystectomy clips are present.
IMPRESSION: 1. Mild compression fracture superior endplate of L1.
2. Moderate degenerative changes of the lower lumbar spine.

## 2023-05-10 DIAGNOSIS — Z9181 History of falling: Secondary | ICD-10-CM | POA: Diagnosis not present

## 2023-05-10 DIAGNOSIS — M4312 Spondylolisthesis, cervical region: Secondary | ICD-10-CM | POA: Diagnosis not present

## 2023-05-10 DIAGNOSIS — Z7982 Long term (current) use of aspirin: Secondary | ICD-10-CM | POA: Diagnosis not present

## 2023-05-10 DIAGNOSIS — Z96651 Presence of right artificial knee joint: Secondary | ICD-10-CM | POA: Diagnosis not present

## 2023-05-10 DIAGNOSIS — M47812 Spondylosis without myelopathy or radiculopathy, cervical region: Secondary | ICD-10-CM | POA: Diagnosis not present

## 2023-05-10 DIAGNOSIS — I10 Essential (primary) hypertension: Secondary | ICD-10-CM | POA: Diagnosis not present

## 2023-05-10 DIAGNOSIS — M25561 Pain in right knee: Secondary | ICD-10-CM | POA: Diagnosis not present

## 2023-05-10 DIAGNOSIS — M5030 Other cervical disc degeneration, unspecified cervical region: Secondary | ICD-10-CM | POA: Diagnosis not present

## 2023-05-12 DIAGNOSIS — Z96651 Presence of right artificial knee joint: Secondary | ICD-10-CM | POA: Diagnosis not present

## 2023-05-12 DIAGNOSIS — I10 Essential (primary) hypertension: Secondary | ICD-10-CM | POA: Diagnosis not present

## 2023-05-12 DIAGNOSIS — Z9181 History of falling: Secondary | ICD-10-CM | POA: Diagnosis not present

## 2023-05-12 DIAGNOSIS — M5030 Other cervical disc degeneration, unspecified cervical region: Secondary | ICD-10-CM | POA: Diagnosis not present

## 2023-05-12 DIAGNOSIS — M47812 Spondylosis without myelopathy or radiculopathy, cervical region: Secondary | ICD-10-CM | POA: Diagnosis not present

## 2023-05-12 DIAGNOSIS — M4312 Spondylolisthesis, cervical region: Secondary | ICD-10-CM | POA: Diagnosis not present

## 2023-05-12 DIAGNOSIS — Z7982 Long term (current) use of aspirin: Secondary | ICD-10-CM | POA: Diagnosis not present

## 2023-05-16 ENCOUNTER — Telehealth: Payer: Self-pay

## 2023-05-16 DIAGNOSIS — Z7982 Long term (current) use of aspirin: Secondary | ICD-10-CM | POA: Diagnosis not present

## 2023-05-16 DIAGNOSIS — I10 Essential (primary) hypertension: Secondary | ICD-10-CM | POA: Diagnosis not present

## 2023-05-16 DIAGNOSIS — M47812 Spondylosis without myelopathy or radiculopathy, cervical region: Secondary | ICD-10-CM | POA: Diagnosis not present

## 2023-05-16 DIAGNOSIS — Z96651 Presence of right artificial knee joint: Secondary | ICD-10-CM | POA: Diagnosis not present

## 2023-05-16 DIAGNOSIS — M4312 Spondylolisthesis, cervical region: Secondary | ICD-10-CM | POA: Diagnosis not present

## 2023-05-16 DIAGNOSIS — M5030 Other cervical disc degeneration, unspecified cervical region: Secondary | ICD-10-CM | POA: Diagnosis not present

## 2023-05-16 DIAGNOSIS — Z9181 History of falling: Secondary | ICD-10-CM | POA: Diagnosis not present

## 2023-05-16 NOTE — Telephone Encounter (Signed)
Transition Care Management Follow-up Telephone Call Date of discharge and from where: 04/13/2023 [facility] How have you been since you were released from the hospital? Patient stated she is feeling ok not totally back to normal. Any questions or concerns? No  Items Reviewed: Did the pt receive and understand the discharge instructions provided? Yes  Medications obtained and verified? Yes  Other? No  Any new allergies since your discharge? No  Dietary orders reviewed? Yes Do you have support at home? Yes   Follow up appointments reviewed:  PCP Hospital f/u appt confirmed? Yes  Scheduled to see Mort Sawyers, FNP on 04/29/2023 @ [facility] [place]. Specialist Hospital f/u appt confirmed? Yes  Scheduled to see Bridgette Habermann,  PA on 05/04/2023 @ [facility]. Are transportation arrangements needed? No  If their condition worsens, is the pt aware to call PCP or go to the Emergency Dept.? Yes Was the patient provided with contact information for the PCP's office or ED? Yes Was to pt encouraged to call back with questions or concerns? Yes   Mandy Castillo [facility]  [facility], [facility] Guide Direct Dial: [phone]  Website: [email or web address]

## 2023-05-17 DIAGNOSIS — I10 Essential (primary) hypertension: Secondary | ICD-10-CM | POA: Diagnosis not present

## 2023-05-17 DIAGNOSIS — Z9181 History of falling: Secondary | ICD-10-CM | POA: Diagnosis not present

## 2023-05-17 DIAGNOSIS — Z96651 Presence of right artificial knee joint: Secondary | ICD-10-CM | POA: Diagnosis not present

## 2023-05-17 DIAGNOSIS — Z7982 Long term (current) use of aspirin: Secondary | ICD-10-CM | POA: Diagnosis not present

## 2023-05-17 DIAGNOSIS — M4312 Spondylolisthesis, cervical region: Secondary | ICD-10-CM | POA: Diagnosis not present

## 2023-05-17 DIAGNOSIS — M5030 Other cervical disc degeneration, unspecified cervical region: Secondary | ICD-10-CM | POA: Diagnosis not present

## 2023-05-17 DIAGNOSIS — M47812 Spondylosis without myelopathy or radiculopathy, cervical region: Secondary | ICD-10-CM | POA: Diagnosis not present

## 2023-05-20 DIAGNOSIS — Z96651 Presence of right artificial knee joint: Secondary | ICD-10-CM | POA: Diagnosis not present

## 2023-05-20 DIAGNOSIS — M5030 Other cervical disc degeneration, unspecified cervical region: Secondary | ICD-10-CM | POA: Diagnosis not present

## 2023-05-20 DIAGNOSIS — M47812 Spondylosis without myelopathy or radiculopathy, cervical region: Secondary | ICD-10-CM | POA: Diagnosis not present

## 2023-05-20 DIAGNOSIS — M4312 Spondylolisthesis, cervical region: Secondary | ICD-10-CM | POA: Diagnosis not present

## 2023-05-20 DIAGNOSIS — I10 Essential (primary) hypertension: Secondary | ICD-10-CM | POA: Diagnosis not present

## 2023-05-20 DIAGNOSIS — Z7982 Long term (current) use of aspirin: Secondary | ICD-10-CM | POA: Diagnosis not present

## 2023-05-20 DIAGNOSIS — Z9181 History of falling: Secondary | ICD-10-CM | POA: Diagnosis not present

## 2023-05-24 DIAGNOSIS — Z9181 History of falling: Secondary | ICD-10-CM | POA: Diagnosis not present

## 2023-05-24 DIAGNOSIS — I10 Essential (primary) hypertension: Secondary | ICD-10-CM | POA: Diagnosis not present

## 2023-05-24 DIAGNOSIS — M5030 Other cervical disc degeneration, unspecified cervical region: Secondary | ICD-10-CM | POA: Diagnosis not present

## 2023-05-24 DIAGNOSIS — M4312 Spondylolisthesis, cervical region: Secondary | ICD-10-CM | POA: Diagnosis not present

## 2023-05-24 DIAGNOSIS — Z96651 Presence of right artificial knee joint: Secondary | ICD-10-CM | POA: Diagnosis not present

## 2023-05-24 DIAGNOSIS — Z7982 Long term (current) use of aspirin: Secondary | ICD-10-CM | POA: Diagnosis not present

## 2023-05-24 DIAGNOSIS — M47812 Spondylosis without myelopathy or radiculopathy, cervical region: Secondary | ICD-10-CM | POA: Diagnosis not present

## 2023-05-25 DIAGNOSIS — Z9181 History of falling: Secondary | ICD-10-CM | POA: Diagnosis not present

## 2023-05-25 DIAGNOSIS — Z7982 Long term (current) use of aspirin: Secondary | ICD-10-CM | POA: Diagnosis not present

## 2023-05-25 DIAGNOSIS — M47812 Spondylosis without myelopathy or radiculopathy, cervical region: Secondary | ICD-10-CM | POA: Diagnosis not present

## 2023-05-25 DIAGNOSIS — M5030 Other cervical disc degeneration, unspecified cervical region: Secondary | ICD-10-CM | POA: Diagnosis not present

## 2023-05-25 DIAGNOSIS — Z96651 Presence of right artificial knee joint: Secondary | ICD-10-CM | POA: Diagnosis not present

## 2023-05-25 DIAGNOSIS — M4312 Spondylolisthesis, cervical region: Secondary | ICD-10-CM | POA: Diagnosis not present

## 2023-05-25 DIAGNOSIS — I10 Essential (primary) hypertension: Secondary | ICD-10-CM | POA: Diagnosis not present

## 2023-05-27 ENCOUNTER — Encounter: Payer: Self-pay | Admitting: Family

## 2023-05-27 DIAGNOSIS — I10 Essential (primary) hypertension: Secondary | ICD-10-CM | POA: Diagnosis not present

## 2023-05-27 DIAGNOSIS — M47812 Spondylosis without myelopathy or radiculopathy, cervical region: Secondary | ICD-10-CM | POA: Diagnosis not present

## 2023-05-27 DIAGNOSIS — M4312 Spondylolisthesis, cervical region: Secondary | ICD-10-CM | POA: Diagnosis not present

## 2023-05-27 DIAGNOSIS — Z9181 History of falling: Secondary | ICD-10-CM | POA: Diagnosis not present

## 2023-05-27 DIAGNOSIS — M5030 Other cervical disc degeneration, unspecified cervical region: Secondary | ICD-10-CM | POA: Diagnosis not present

## 2023-05-27 DIAGNOSIS — Z96651 Presence of right artificial knee joint: Secondary | ICD-10-CM | POA: Diagnosis not present

## 2023-05-27 DIAGNOSIS — Z7982 Long term (current) use of aspirin: Secondary | ICD-10-CM | POA: Diagnosis not present

## 2023-06-01 DIAGNOSIS — M47812 Spondylosis without myelopathy or radiculopathy, cervical region: Secondary | ICD-10-CM | POA: Diagnosis not present

## 2023-06-01 DIAGNOSIS — M5030 Other cervical disc degeneration, unspecified cervical region: Secondary | ICD-10-CM | POA: Diagnosis not present

## 2023-06-01 DIAGNOSIS — Z96651 Presence of right artificial knee joint: Secondary | ICD-10-CM | POA: Diagnosis not present

## 2023-06-01 DIAGNOSIS — Z7982 Long term (current) use of aspirin: Secondary | ICD-10-CM | POA: Diagnosis not present

## 2023-06-01 DIAGNOSIS — Z9181 History of falling: Secondary | ICD-10-CM | POA: Diagnosis not present

## 2023-06-01 DIAGNOSIS — I10 Essential (primary) hypertension: Secondary | ICD-10-CM | POA: Diagnosis not present

## 2023-06-01 DIAGNOSIS — M4312 Spondylolisthesis, cervical region: Secondary | ICD-10-CM | POA: Diagnosis not present

## 2023-06-03 DIAGNOSIS — Z96651 Presence of right artificial knee joint: Secondary | ICD-10-CM | POA: Diagnosis not present

## 2023-06-03 DIAGNOSIS — M5030 Other cervical disc degeneration, unspecified cervical region: Secondary | ICD-10-CM | POA: Diagnosis not present

## 2023-06-03 DIAGNOSIS — Z9181 History of falling: Secondary | ICD-10-CM | POA: Diagnosis not present

## 2023-06-03 DIAGNOSIS — Z7982 Long term (current) use of aspirin: Secondary | ICD-10-CM | POA: Diagnosis not present

## 2023-06-03 DIAGNOSIS — M47812 Spondylosis without myelopathy or radiculopathy, cervical region: Secondary | ICD-10-CM | POA: Diagnosis not present

## 2023-06-03 DIAGNOSIS — M4312 Spondylolisthesis, cervical region: Secondary | ICD-10-CM | POA: Diagnosis not present

## 2023-06-03 DIAGNOSIS — I10 Essential (primary) hypertension: Secondary | ICD-10-CM | POA: Diagnosis not present

## 2023-06-08 ENCOUNTER — Ambulatory Visit: Payer: Medicare Other | Attending: Family | Admitting: Audiologist

## 2023-06-08 DIAGNOSIS — H903 Sensorineural hearing loss, bilateral: Secondary | ICD-10-CM | POA: Insufficient documentation

## 2023-06-08 DIAGNOSIS — H6121 Impacted cerumen, right ear: Secondary | ICD-10-CM | POA: Diagnosis not present

## 2023-06-08 NOTE — Procedures (Signed)
  Outpatient Audiology and [facility] [address] [place], [place]  [address] [phone]  AUDIOLOGICAL  EVALUATION  NAME: Mandy Castillo     DOB:   02/12/1941      MRN: [id number]                                                                                     DATE: 06/08/2023     REFERENT: Mort Sawyers, FNP STATUS: Outpatient DIAGNOSIS: Impacted Cerumen Right Ear    History: [name] was seen for an audiological evaluation to monitor her hearing loss diagnosed with [facility] in 2023. [name] feels her right ear is full of water, he hearing feels muffled. This happened after a recent shower. She has some pain in the right ear.   Evaluation:  Otoscopy showed no view of the right tympanic membrane, occluding cerumen. Left ear tympanic membrane visible and healthy Tympanometry results were consistent with flat response in right ear, and normal response in left ear. Consistent with occlusion of right ear from cerumen.   Results:  The test results were reviewed with [name]. She needs to get the wax out of her right ear before an accurate hearing test can be performed. The wax is likely why she feels muffled on the right side. Try Debrox drops, if still feeling muffled see PCP.    Recommendations: 1.   Follow up scheduled 07/13/2023 to give [name] time to use Debrox and have wax removed if needed.    16 minutes spent testing and counseling on results.   If you have any questions please feel free to contact me at [phone].  Ammie Ferrier Au.D.  Audiologist   06/08/2023  10:59 AM  Cc: Mort Sawyers, FNP

## 2023-06-13 ENCOUNTER — Other Ambulatory Visit: Payer: Self-pay | Admitting: Family

## 2023-06-13 DIAGNOSIS — Z1231 Encounter for screening mammogram for malignant neoplasm of breast: Secondary | ICD-10-CM

## 2023-06-16 DIAGNOSIS — M545 Low back pain, unspecified: Secondary | ICD-10-CM | POA: Diagnosis not present

## 2023-06-16 DIAGNOSIS — M25561 Pain in right knee: Secondary | ICD-10-CM | POA: Diagnosis not present

## 2023-06-16 DIAGNOSIS — M25562 Pain in left knee: Secondary | ICD-10-CM | POA: Diagnosis not present

## 2023-07-13 ENCOUNTER — Ambulatory Visit: Payer: Medicare Other | Attending: Family | Admitting: Audiologist

## 2023-07-13 DIAGNOSIS — H903 Sensorineural hearing loss, bilateral: Secondary | ICD-10-CM | POA: Insufficient documentation

## 2023-07-13 NOTE — Procedures (Signed)
  Outpatient Audiology and [facility] [address] [place], [place]  [address] [phone]  AUDIOLOGICAL  EVALUATION  NAME: Mandy Castillo     DOB:   1940/12/27      MRN: [id number]                                                                                     DATE: 07/13/2023     REFERENT: Mort Sawyers, FNP STATUS: Outpatient DIAGNOSIS: Sensorineural Hearing Loss   History: Pola was seen for an audiological evaluation to monitor her hearing loss diagnosed with [facility] in 2023. Evalette was seen 06/08/23. She felt her right ear was full of water and hearing muffled. This happened after a recent shower. She has some pain in the right ear. She had impacted wax in the right ear. Hearing test rescheduled to today. Wax was removed from right ear. Pain and muffled feeling gone. She feels she hears well now.    Evaluation:  Otoscopy showed a clear view of the tympanic membranes, bilaterally Tympanometry results were consistent with normal middle ear function, bilaterally   Audiometric testing was completed using Conventional Audiometry techniques with insert earphones and supraural headphones. 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 Speech Recognition Thresholds were obtained at 35dB HL in the right ear and at 30dB HL in the left ear. Word Recognition Testing was completed at  40dB SL and Sevannah scored 100% in each ear.    Results:  The test results were reviewed with [name]. Hearing loss does not show significant progression. Hearing aids discussed, she is not ready for aids a this time.  Audiogram printed and provided to [place].    Recommendations: Annual audiometric testing recommended to monitor hearing loss for progression.   27 minutes spent testing and counseling on results.   If you have any questions please feel free to contact me at [phone].  Ammie Ferrier Au.D.   Audiologist   07/13/2023  11:42 AM  Cc: Mort Sawyers, FNP

## 2023-09-05 ENCOUNTER — Ambulatory Visit
Admission: RE | Admit: 2023-09-05 | Discharge: 2023-09-05 | Disposition: A | Discharge status: Home / Self Care | Payer: Medicare Other | Source: Ambulatory Visit | Attending: Family | Admitting: Family

## 2023-09-05 DIAGNOSIS — Z780 Asymptomatic menopausal state: Secondary | ICD-10-CM | POA: Insufficient documentation

## 2023-09-05 DIAGNOSIS — Z1231 Encounter for screening mammogram for malignant neoplasm of breast: Secondary | ICD-10-CM | POA: Insufficient documentation

## 2023-09-06 ENCOUNTER — Encounter: Payer: Self-pay | Admitting: Family

## 2023-09-09 ENCOUNTER — Other Ambulatory Visit: Payer: Self-pay | Admitting: Family

## 2023-09-09 DIAGNOSIS — R921 Mammographic calcification found on diagnostic imaging of breast: Secondary | ICD-10-CM

## 2023-09-09 DIAGNOSIS — R928 Other abnormal and inconclusive findings on diagnostic imaging of breast: Secondary | ICD-10-CM

## 2023-09-12 ENCOUNTER — Encounter: Payer: Self-pay | Admitting: Family

## 2023-09-15 ENCOUNTER — Ambulatory Visit: Payer: Medicare Other | Admitting: Dermatology

## 2023-09-21 ENCOUNTER — Ambulatory Visit (INDEPENDENT_AMBULATORY_CARE_PROVIDER_SITE_OTHER): Payer: Medicare Other | Admitting: Dermatology

## 2023-09-21 ENCOUNTER — Encounter: Payer: Self-pay | Admitting: Dermatology

## 2023-09-21 VITALS — BP 147/80 | HR 74

## 2023-09-21 DIAGNOSIS — Z1283 Encounter for screening for malignant neoplasm of skin: Secondary | ICD-10-CM | POA: Diagnosis not present

## 2023-09-21 DIAGNOSIS — L821 Other seborrheic keratosis: Secondary | ICD-10-CM

## 2023-09-21 DIAGNOSIS — D229 Melanocytic nevi, unspecified: Secondary | ICD-10-CM | POA: Diagnosis not present

## 2023-09-21 DIAGNOSIS — L910 Hypertrophic scar: Secondary | ICD-10-CM | POA: Diagnosis not present

## 2023-09-21 NOTE — Progress Notes (Signed)
   New Patient Visit   Subjective  Mandy Castillo is a 83 y.o. female who presents for the following: Skin Cancer Screening for some spot of concern. No hx or family hx of skin cancer. She states that she does not remember what the spots were but her PCP was concerned about some spots on her back and chest.  The patient presents for Skin Exam for skin cancer screening and mole check. The patient has spots, moles and lesions to be evaluated, some may be new or changing.  The following portions of the chart were reviewed this encounter and updated as appropriate: medications, allergies, medical history  Review of Systems:  No other skin or systemic complaints except as noted in HPI or Assessment and Plan.  Objective  Well appearing patient in no apparent distress; mood and affect are within normal limits.  A full examination was performed including scalp, head, eyes, ears, nose, lips, neck, chest, axillae, abdomen, back, bilateral upper extremities, hands, fingers, and fingernails. All findings within normal limits unless otherwise noted below.   Relevant physical exam findings are noted in the Assessment and Plan.    Assessment & Plan   SKIN CANCER SCREENING PERFORMED TODAY.  MELANOCYTIC NEVI - Tan-brown and/or pink-flesh-colored symmetric macules and papules - Benign appearing on exam today - Observation - Call clinic for new or changing moles - Recommend daily use of broad spectrum spf 30+ sunscreen to sun-exposed areas.   SEBORRHEIC KERATOSIS - Stuck-on, waxy, tan-brown papules and/or plaques  - Benign-appearing - Discussed benign etiology and prognosis. - Observe - Call for any changes  KELOID- mid abdomen Exam shows Firm pink/brown dermal papule(s)/plaque(s).  Plan: Monitor for changes     No follow-ups on file.  I, Tillie Fantasia, CMA, am acting as scribe for Gwenith Daily, MD.   Documentation: I have reviewed the above documentation for accuracy and  completeness, and I agree with the above.  Gwenith Daily, MD

## 2023-09-21 NOTE — Patient Instructions (Signed)

## 2023-09-23 ENCOUNTER — Ambulatory Visit
Admission: RE | Admit: 2023-09-23 | Discharge: 2023-09-23 | Disposition: A | Discharge status: Home / Self Care | Payer: Medicare Other | Source: Ambulatory Visit | Attending: Family | Admitting: Family

## 2023-09-23 ENCOUNTER — Other Ambulatory Visit: Payer: Self-pay | Admitting: Family

## 2023-09-23 DIAGNOSIS — R921 Mammographic calcification found on diagnostic imaging of breast: Secondary | ICD-10-CM | POA: Insufficient documentation

## 2023-09-23 DIAGNOSIS — R928 Other abnormal and inconclusive findings on diagnostic imaging of breast: Secondary | ICD-10-CM

## 2023-09-26 NOTE — Progress Notes (Signed)
 noted

## 2023-10-04 ENCOUNTER — Ambulatory Visit (INDEPENDENT_AMBULATORY_CARE_PROVIDER_SITE_OTHER): Payer: Medicare Other

## 2023-10-04 VITALS — Ht 68.0 in | Wt 168.0 lb

## 2023-10-04 DIAGNOSIS — Z0000 Encounter for general adult medical examination without abnormal findings: Secondary | ICD-10-CM

## 2023-10-04 NOTE — Progress Notes (Signed)
 Subjective:   Mandy Castillo is a 83 y.o. who presents for a Medicare Wellness preventive visit.  Visit Complete: Virtual I connected with  Mandy Castillo on 10/04/23 by a audio enabled telemedicine application and verified that I am speaking with the correct person using two identifiers.  Patient Location: Home  Provider Location: Home Office  I discussed the limitations of evaluation and management by telemedicine. The patient expressed understanding and agreed to proceed.  Vital Signs: Because this visit was a virtual/telehealth visit, some criteria may be missing or patient reported. Any vitals not documented were not able to be obtained and vitals that have been documented are patient reported.  VideoDeclined- This patient declined Librarian, academic. Therefore the visit was completed with audio only.  AWV Questionnaire: No: Patient Medicare AWV questionnaire was not completed prior to this visit.  Cardiac Risk Factors include: advanced age (>9men, >59 women);dyslipidemia;hypertension     Objective:    Today's Vitals   10/04/23 1121  Weight: 168 lb (76.2 kg)  Height: 5\' 8"  (1.727 m)   Body mass index is 25.54 kg/m.     10/04/2023   11:35 AM 04/13/2023    4:36 PM 01/31/2023    5:22 PM 01/31/2023   11:56 AM 01/18/2023    8:20 AM 09/29/2022    9:26 AM 04/20/2022   10:14 AM  Advanced Directives  Does Patient Have a Medical Advance Directive? No No No No No No No  Would patient like information on creating a medical advance directive?  No - Patient declined No - Patient declined No - Patient declined   No - Patient declined    Current Medications (verified) Outpatient Encounter Medications as of 10/04/2023  Medication Sig   tiZANidine (ZANAFLEX) 2 MG tablet Take 1 tablet (2 mg total) by mouth every 8 (eight) hours as needed for muscle spasms.   Apoaequorin (PREVAGEN) 10 MG CAPS Take 10 mg by mouth daily.   aspirin EC 325 MG tablet Take  1 tablet (325 mg total) by mouth 2 (two) times daily after a meal. Take x 1 month post op to decrease risk of blood clots. (Patient taking differently: Take 325 mg by mouth daily. Take x 1 month post op to decrease risk of blood clots.)   atorvastatin (LIPITOR) 10 MG tablet Take 1 tablet (10 mg total) by mouth daily.   celecoxib (CELEBREX) 100 MG capsule Take 100 mg by mouth 2 (two) times daily.   Cholecalciferol 25 MCG (1000 UT) capsule Take 1,000 Units by mouth daily.   Cyanocobalamin (B-12 PO) Take 1 tablet by mouth daily.   docusate sodium (COLACE) 100 MG capsule Take 1 capsule (100 mg total) by mouth 2 (two) times daily as needed for mild constipation.   donepezil (ARICEPT) 10 MG tablet Take 10 mg by mouth at bedtime.   HYDROcodone-acetaminophen (NORCO/VICODIN) 5-325 MG tablet Take 1 tablet by mouth every 6 (six) hours as needed. (Patient not taking: Reported on 10/04/2023)   metoprolol succinate (TOPROL-XL) 50 MG 24 hr tablet TAKE 1 TABLET BY MOUTH DAILY   omeprazole (PRILOSEC) 20 MG capsule Take 1 capsule (20 mg total) by mouth daily.   ondansetron (ZOFRAN-ODT) 4 MG disintegrating tablet Take 1 tablet (4 mg total) by mouth every 8 (eight) hours as needed for nausea or vomiting. (Patient not taking: Reported on 10/04/2023)   oxyCODONE (OXY IR/ROXICODONE) 5 MG immediate release tablet Take 5-10 mg by mouth every 4 (four) hours as needed. (Patient not taking:  Reported on 10/04/2023)   oxyCODONE-acetaminophen (PERCOCET/ROXICET) 5-325 MG tablet Take 1-2 tablets by mouth every 6 (six) hours as needed for severe pain. (Patient not taking: Reported on 10/04/2023)   No facility-administered encounter medications on file as of 10/04/2023.    Allergies (verified) Patient has no known allergies.   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   Procedure: TOTAL KNEE ARTHROPLASTY;  Surgeon: Jodi Geralds, MD;  Location: [facility];  Service: Orthopedics;  Laterality: Left;   TOTAL KNEE ARTHROPLASTY Right 01/31/2023   Procedure: RIGHT TOTAL KNEE ARTHROPLASTY;  Surgeon: Jodi Geralds, MD;  Location: [facility];  Service: Orthopedics;  Laterality: Right;   Family History  Problem Relation Age of Onset   Stroke Mother 52   Prostate cancer Father    Breast cancer Neg Hx    Social History   Socioeconomic History   Marital status: Single    Spouse name: Not on file   Number of children: 0   Years of education: master's degree   Highest education level: Not on file  Occupational History   Occupation: retired  Tobacco Use   Smoking status: Never    Passive exposure: Never   Smokeless tobacco: Never  Vaping Use   Vaping status: Never Used  Substance and Sexual Activity   Alcohol use: Yes    Comment: wine every few months   Drug use: Never   Sexual activity: Not Currently  Other Topics Concern   Not on file  Social History Narrative   04/21/20   From: [place], [place]   Living: with partner - Dorena Bodo - 2011   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, corporate and international relations   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        Family: near [place] - cousin and sister in [place]       Enjoys: reading, writing      Exercise: PT currently for knees   Diet: trying to eat healthy      Safety   Seat belts: Yes    Guns: No   Safe in relationships: Yes    Social Drivers of Corporate investment banker Strain: Low Risk  (10/04/2023)   Overall Financial Resource Strain (CARDIA)    Difficulty  of Paying Living Expenses: Not hard at all  Food Insecurity: No Food Insecurity (10/04/2023)   Hunger Vital Sign    Worried About Running Out of Food in the Last Year: Never true    Ran Out of Food in the Last Year: Never true  Transportation Needs: No Transportation Needs (10/04/2023)   PRAPARE - Administrator, Civil Service (Medical): No    Lack of Transportation (Non-Medical): No  Physical Activity: Sufficiently Active (10/04/2023)   Exercise Vital Sign    Days of Exercise per Week: 4 days    Minutes of Exercise per Session: 40  min  Stress: No Stress Concern Present (10/04/2023)   [employer] of Occupational Health - Occupational Stress Questionnaire    Feeling of Stress : Not at all  Social Connections: Moderately Isolated (10/04/2023)   Social Connection and Isolation Panel [NHANES]    Frequency of Communication with Friends and Family: More than three times a week    Frequency of Social Gatherings with Friends and Family: More than three times a week    Attends Religious Services: Never    Database administrator or Organizations: No    Attends Engineer, structural: Never    Marital Status: Living with partner    Tobacco Counseling Counseling given: Not Answered  Clinical Intake:  Pre-visit preparation completed: Yes  Pain : No/denies pain   BMI - recorded: 25.54 Nutritional Status: BMI 25 -29 Overweight Nutritional Risks: None Diabetes: No  How often do you need to have someone help you when you read instructions, pamphlets, or other written materials from your doctor or pharmacy?: 1 - Never  Interpreter Needed?: No  Comments: has roommate that lives with pt Information entered by :: B.Kniyah Khun,LPN   Activities of Daily Living     10/04/2023   11:36 AM 01/31/2023    5:22 PM  In your present state of health, do you have any difficulty performing the following activities:  Hearing? 0 0  Vision? 0 0  Difficulty concentrating or making  decisions? 1 0  Walking or climbing stairs? 0 1  Dressing or bathing? 0 0  Doing errands, shopping? 1 0  Preparing Food and eating ? N   Using the Toilet? N   In the past six months, have you accidently leaked urine? N   Do you have problems with loss of bowel control? N   Managing your Medications? N   Managing your Finances? N   Housekeeping or managing your Housekeeping? N     Patient Care Team: Mort Sawyers, FNP as PCP - General (Family Medicine) Lennette Bihari, MD as PCP - Cardiology (Cardiology) Morene Crocker, MD as Referring Physician (Neurology) Jodi Geralds, MD as Consulting Physician (Orthopedic Surgery) Manning Charity, OD as Referring Physician (Optometry)  Indicate any recent Medical Services you may have received from other than [facility] providers in the past year (date may be approximate).     Assessment:   This is a routine wellness examination for Holden.  Hearing/Vision screen Hearing Screening - Comments:: Pt says her hearing is good Vision Screening - Comments:: Pt says their vision is good;no difficulties    Goals Addressed               This Visit's Progress     Patient Stated (pt-stated)        I want to get back to walking (1 mile/day). I want to stay healthy and stay connected to my family      COMPLETED: Weight (lb) < 165 lb (74.8 kg)   168 lb (76.2 kg)     Lose weight and exercise more       Depression Screen     10/04/2023   11:30 AM 02/25/2023   10:08 AM 01/03/2023   10:26 AM 09/29/2022    9:22 AM 07/24/2020    9:56 AM 04/21/2020    2:37 PM  PHQ 2/9 Scores  PHQ - 2 Score 0 1 0 0 0 0  PHQ- 9 Score  4        Fall Risk     10/04/2023  11:25 AM 02/25/2023   10:07 AM 01/03/2023   10:26 AM 09/29/2022    9:19 AM 09/15/2021   11:39 AM  Fall Risk   Falls in the past year? 0 1 0 1 1  Number falls in past yr: 0 1 0 1 1  Injury with Fall? 0 1 0 0 1  Comment     Pt states she hurt her back  Risk for fall due to : No Fall Risks History of  fall(s) History of fall(s) No Fall Risks History of fall(s)  Follow up Education provided;Falls prevention discussed Falls evaluation completed Falls evaluation completed;Education provided;Falls prevention discussed      MEDICARE RISK AT HOME:  Medicare Risk at Home Any stairs in or around the home?: No If so, are there any without handrails?: No Home free of loose throw rugs in walkways, pet beds, electrical cords, etc?: Yes Adequate lighting in your home to reduce risk of falls?: Yes Life alert?: No Use of a cane, walker or w/c?: No Grab bars in the bathroom?: Yes Shower chair or bench in shower?: No Elevated toilet seat or a handicapped toilet?: Yes  TIMED UP AND GO:  Was the test performed?  No  Cognitive Function: 6CIT completed      09/01/2020   10:34 AM  Montreal Cognitive Assessment   Visuospatial/ Executive (0/5) 3  Naming (0/3) 1  Attention: Read list of digits (0/2) 2  Attention: Read list of letters (0/1) 1  Attention: Serial 7 subtraction starting at 100 (0/3) 2  Language: Repeat phrase (0/2) 1  Language : Fluency (0/1) 1  Abstraction (0/2) 2  Delayed Recall (0/5) 1  Orientation (0/6) 6  Total 20      10/04/2023   11:38 AM 09/29/2022    9:32 AM  6CIT Screen  What Year? 0 points 0 points  What month? 0 points 0 points  What time? 0 points 0 points  Count back from 20 0 points 0 points  Months in reverse 4 points 4 points  Repeat phrase 2 points 2 points  Total Score 6 points 6 points    Immunizations Immunization History  Administered Date(s) Administered   Fluad Quad(high Dose 65+) 07/24/2020   Fluad Trivalent(High Dose 65+) 04/29/2023   Influenza, High Dose Seasonal PF 05/07/2021   PFIZER(Purple Top)SARS-COV-2 Vaccination 09/06/2019, 10/01/2019, 05/14/2020, 12/11/2020   PNEUMOCOCCAL CONJUGATE-20 01/03/2023   Pfizer Covid-19 Vaccine Bivalent Booster 33yrs & up 05/07/2021   Tdap 10/06/2021    Screening Tests Health Maintenance  Topic Date Due    Zoster Vaccines- Shingrix (1 of 2) Never done   COVID-19 Vaccine (6 - 2024-25 season) 03/27/2023   Medicare Annual Wellness (AWV)  10/03/2024   DTaP/Tdap/Td (2 - Td or Tdap) 10/07/2031   Pneumonia Vaccine 4+ Years old  Completed   INFLUENZA VACCINE  Completed   DEXA SCAN  Completed   HPV VACCINES  Aged Out    Health Maintenance  Health Maintenance Due  Topic Date Due   Zoster Vaccines- Shingrix (1 of 2) Never done   COVID-19 Vaccine (6 - 2024-25 season) 03/27/2023   Health Maintenance Items Addressed:  Additional Screening:  Vision Screening: Recommended annual ophthalmology exams for early detection of glaucoma and other disorders of the eye.  Dental Screening: Recommended annual dental exams for proper oral hygiene  Community Resource Referral / Chronic Care Management: CRR required this visit?  No   CCM required this visit?  No     Plan:  I have personally reviewed and noted the following in the patient's chart:   Medical and social history Use of alcohol, tobacco or illicit drugs  Current medications and supplements including opioid prescriptions. Patient is not currently taking opioid prescriptions. Functional ability and status Nutritional status Physical activity Advanced directives List of other physicians Hospitalizations, surgeries, and ER visits in previous 12 months Vitals Screenings to include cognitive, depression, and falls Referrals and appointments  In addition, I have reviewed and discussed with patient certain preventive protocols, quality metrics, and best practice recommendations. A written personalized care plan for preventive services as well as general preventive health recommendations were provided to patient.   Sue Lush, LPN   1/61/0960   After Visit Summary: (MyChart) Due to this being a telephonic visit, the after visit summary with patients personalized plan was offered to patient via MyChart   Notes: Nothing  significant to report at this time.

## 2023-10-04 NOTE — Patient Instructions (Addendum)
 Mandy Castillo , Thank you for taking time to come for your Medicare Wellness Visit. I appreciate your ongoing commitment to your health goals. Please review the following plan we discussed and let me know if I can assist you in the future.   Referrals/Orders/Follow-Ups/Clinician Recommendations: none  This is a list of the screening recommended for you and due dates:  Health Maintenance  Topic Date Due   Zoster (Shingles) Vaccine (1 of 2) Never done   COVID-19 Vaccine (6 - 2024-25 season) 03/27/2023   Medicare Annual Wellness Visit  10/03/2024   DTaP/Tdap/Td vaccine (2 - Td or Tdap) 10/07/2031   Pneumonia Vaccine  Completed   Flu Shot  Completed   DEXA scan (bone density measurement)  Completed   HPV Vaccine  Aged Out    Advanced directives: (Declined) Advance directive discussed with you today. Even though you declined this today, please call our office should you change your mind, and we can give you the proper paperwork for you to fill out.  Next Medicare Annual Wellness Visit scheduled for next year: Yes 10/06/23 @ 11:30am televisit

## 2023-10-06 ENCOUNTER — Ambulatory Visit
Admission: RE | Admit: 2023-10-06 | Discharge: 2023-10-06 | Disposition: A | Discharge status: Home / Self Care | Source: Ambulatory Visit | Attending: Family | Admitting: Family

## 2023-10-06 DIAGNOSIS — R921 Mammographic calcification found on diagnostic imaging of breast: Secondary | ICD-10-CM | POA: Insufficient documentation

## 2023-10-06 DIAGNOSIS — R928 Other abnormal and inconclusive findings on diagnostic imaging of breast: Secondary | ICD-10-CM

## 2023-10-06 DIAGNOSIS — N6022 Fibroadenosis of left breast: Secondary | ICD-10-CM | POA: Insufficient documentation

## 2023-10-06 HISTORY — PX: BREAST BIOPSY: SHX20

## 2023-10-06 MED ORDER — LIDOCAINE-EPINEPHRINE 1 %-1:100000 IJ SOLN
20.0000 mL | Freq: Once | INTRAMUSCULAR | Status: AC
  Administered 2023-10-06: 20 mL
  Filled 2023-10-06: qty 20

## 2023-10-06 MED ORDER — LIDOCAINE 1 % OPTIME INJ - NO CHARGE
5.0000 mL | Freq: Once | INTRAMUSCULAR | Status: AC
  Administered 2023-10-06: 5 mL
  Filled 2023-10-06: qty 6

## 2023-10-06 NOTE — Progress Notes (Signed)
 noted

## 2023-10-07 LAB — SURGICAL PATHOLOGY

## 2023-10-07 NOTE — Progress Notes (Signed)
 noted

## 2023-10-10 ENCOUNTER — Encounter: Payer: Self-pay | Admitting: Family

## 2023-10-10 NOTE — Progress Notes (Signed)
 noted

## 2023-11-08 ENCOUNTER — Encounter: Payer: Self-pay | Admitting: Family

## 2023-11-08 NOTE — Telephone Encounter (Signed)
 Document reviewed from office visit with Dr. Carlis Cherry office 4/9. No mention of hallucinations or agitation, called Dr. Carlis Cherry office to confirm of which they also stated these symptoms were not mentioned.   Since new onset symptoms, please advise pt to go to ER to r/o any other underlying causes, likely will need imaging.

## 2023-11-08 NOTE — Telephone Encounter (Signed)
 Called and spoke to Mandy Castillo on dpr. States patient started having visual hallucinations about two weeks ago. They have increased over time with more agitation and anger to [facility]. Al Alias denies any nausea, vomiting, fever or urinary symptoms. No change in diet or fluid intake. Patient was seen by neurology after start of symptoms but she would not let Al Alias go back with her to visit so he is not aware if patient let them know about new symptoms. They did increase Aricept but it was after symptoms started. Lawerence Pressman to reach out to neurology to make aware of changes in patient. Let him know I would send message to [facility] for review and if she had any additional recommendations. Reviewed all red words with Al Alias and agreed if any will take patient to ED or call 911.

## 2023-11-08 NOTE — Telephone Encounter (Signed)
 Called Al Alias advised of recommendations. States he will try and get patient to go to ED. If he has any questions he will reach out.

## 2023-11-09 ENCOUNTER — Telehealth: Payer: Self-pay | Admitting: Speech Pathology

## 2023-11-09 ENCOUNTER — Ambulatory Visit: Attending: Neurology | Admitting: Speech Pathology

## 2023-11-09 DIAGNOSIS — R41841 Cognitive communication deficit: Secondary | ICD-10-CM | POA: Insufficient documentation

## 2023-11-09 NOTE — Telephone Encounter (Signed)
 Pt was a "no show/no call" today for her ST session.   In reviewing pt's chart, read concerns regarding hallucinations with recommendation to go to the ED.   This Clinical research associate reached out to her significant other Al Alias - listed in chart) - he reports that pt is "doing well" today and not currently experiencing any hallucinations.   Would like to see her for a cognitive communication evaluation if appropriate. He confirms that she would be willing to come in tomorrow (Thursday) at 10:15am.   Will follow up at that time.   Also recommended that he reach out to 911 or the ED should pt condition change.   Manasi Dishon B. Garlin Junker, M.S., CCC-SLP, Tree surgeon Certified Brain Injury Specialist [facility]  [facility] Office [phone] Ascom [phone] Fax [phone]

## 2023-11-10 ENCOUNTER — Ambulatory Visit: Admitting: Speech Pathology

## 2023-11-10 DIAGNOSIS — R41841 Cognitive communication deficit: Secondary | ICD-10-CM

## 2023-11-10 NOTE — Therapy (Signed)
 OUTPATIENT SPEECH LANGUAGE PATHOLOGY  COGNITION EVALUATION   Patient Name: Mandy Castillo MRN: [id number] DOB:May 01, 1941, 83 y.o., female Today's Date: 11/10/2023  PCP: Mort Sawyers, FNP REFERRING PROVIDER: Nilda Calamity, PA; Theora Master, MD   End of Session - 11/10/23 1012     Visit Number 1    Number of Visits 1    Date for SLP Re-Evaluation 11/10/23    Authorization Type [employer]    Progress Note Due on Visit 10    SLP Start Time 1015    SLP Stop Time  1100    SLP Time Calculation (min) 45 min    Activity Tolerance Patient tolerated treatment well             Past Medical History:  Diagnosis Date   Arthritis    Dysrhythmia    GERD (gastroesophageal reflux disease)    Hx of atrial flutter    Hypertension    Sebaceous cyst 07/24/2020   Past Surgical History:  Procedure Laterality Date   ABDOMINAL HYSTERECTOMY     APPENDECTOMY     BREAST BIOPSY Left 10/06/2023   MM LT BREAST BX W LOC DEV 1ST LESION IMAGE BX SPEC STEREO GUIDE 10/06/2023 [facility]   BREAST SURGERY     cyst removed   CATARACT EXTRACTION W/ INTRAOCULAR LENS IMPLANT Right    CHOLECYSTECTOMY     CYST REMOVAL NECK     TOTAL KNEE ARTHROPLASTY Left 05/03/2022   Procedure: TOTAL KNEE ARTHROPLASTY;  Surgeon: Jodi Geralds, MD;  Location: [facility];  Service: Orthopedics;  Laterality: Left;   TOTAL KNEE ARTHROPLASTY Right 01/31/2023   Procedure: RIGHT TOTAL KNEE ARTHROPLASTY;  Surgeon: Jodi Geralds, MD;  Location: [facility];  Service: Orthopedics;  Laterality: Right;   Patient Active Problem List   Diagnosis Date Noted   Sensorineural hearing loss (SNHL) of both ears 04/29/2023   History of recent fall 04/29/2023   Anemia 02/25/2023   Primary osteoarthritis of right knee 01/28/2023   Low serum vitamin B12 01/03/2023   Vitamin D deficiency 01/03/2023   Elevated LDL cholesterol level 01/03/2023   History of atrial flutter 01/03/2023   Primary osteoarthritis of left knee  05/03/2022   S/P total knee arthroplasty, left 05/03/2022   Atypical pigmented skin lesion 02/09/2022   Mild cognitive impairment with memory loss 09/01/2020   Bilateral chronic knee pain 07/24/2020   Essential hypertension 04/21/2020   Nuclear sclerosis, left 01/30/2020   Pseudophakia, right eye 01/30/2020    ONSET DATE: Mild cognitive impairment with memory loss initially diagnosed 09/01/2020; date of referral 11/04/2023  REFERRING DIAG: R41.3 (ICD-10-CM) - Other amnesia   THERAPY DIAG:  Cognitive communication deficit  Rationale for Evaluation and Treatment Rehabilitation  SUBJECTIVE:   SUBJECTIVE STATEMENT: Pt pleasant, she voices that she is concerned about her hallucinations Pt accompanied by: significant other  PERTINENT HISTORY and DIAGNOSTIC FINDINGS:   Pt is a 83 year old with medical history of arthritis, dysrhythmia, GERD, HTN.   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 She reports difficulty with concentration and frustration with memory retrieval, but maintains good social interaction and daily functioning. - increase ocntinue Aricept 10mg  and increase Namenda to 10mg  twich daily.    02/21/2023 CT HEAD WO CONTRAST IMPRESSION:  1. No acute intracranial pathology. Small-vessel white matter  disease.  2. No displaced fractures or dislocations of the facial bones.  3. Soft tissue  contusion of the right brow. Soft tissue laceration  of the right upper lip.  4. No fracture or static subluxation of the cervical spine.  5. 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 Degenerative anterolisthesis of C3 on C4.    PAIN:  Are you having pain? No   FALLS: Has patient fallen in last 6 months?  No  LIVING ENVIRONMENT: Lives with: lives with an adult companion Lives in:  House/apartment  PLOF:  Level of assistance: Needed assistance with ADLs, Needed assistance with IADLS Employment: Retired   PATIENT GOALS  get help with memory and hallucinations  OBJECTIVE:   COGNITIVE COMMUNICATION Overall cognitive status: Impaired Areas of impairment:  Oriented to person Attention: Impaired: Selective Memory: Impaired: Working Teacher, music term Long term Designer, jewellery Awareness: Impaired: Intellectual, Emergent, and Anticipatory Executive function: Impaired: Comment: all impaired by lower level deficits Impaired Functional Impairments: pt no longer drives, relies on her significant other for medication management, bill paying and appt keeping - See clinical impressions statement  AUDITORY COMPREHENSION  Overall auditory comprehension: Appears intact YES/NO questions: Appears intact: basic Following directions: Appears intact: basic Conversation: Simple  READING COMPREHENSION: not assessed  EXPRESSION: verbal  VERBAL EXPRESSION:   Overall verbal expression: Appears intact Automatic speech: name: intact and social response: intact  Repetition: Appears intact Naming: Confrontation: 51-75% and Divergent: 51-75% Pragmatics: Appears intact Non-verbal means of communication: N/A  WRITTEN EXPRESSION: Dominant hand: right Written expression: Appears intact  ORAL MOTOR EXAMINATION Facial : WFL Lingual: WFL Velum: WFL Mandible: WFL Cough: WFL Voice: WFL  MOTOR SPEECH: Overall motor speech: Appears intact Phonation: normal Resonance: WFL Articulation: Appears intact Intelligibility: Intelligible Motor planning: Appears intact  STANDARDIZED ASSESSMENTS: Addenbrooke's Cognitive Examination - ACE III The Addenbrooke's Cognitive Examination-III (ACE-III) is a brief cognitive test that assesses five cognitive domains. The total score is 100 with higher scores indicating better cognitive functioning. Cut off scores of 88 and 82  are recommended for suspicion of dementia (88 has sensitivity of 1.00 and specificity of 0.96, 82 has sensitivity of 0.93 and specificity of 1.00). American Version B  Attention 7/18  Memory 16/26  Fluency 9/14  Language 20/26  Visuospatial 8/16  TOTAL ACE- III Score 60/100     TODAY'S TREATMENT:  Skilled information provided on basic compensatory memory strategies such as using external memory aides (calendar, memory book) as well as establishing routine/schedule and placing items in consistent locations.    PATIENT EDUCATION: Education details: results of this assessment Person educated: Patient and Spouse Education method: Explanation Education comprehension: verbalized understanding   HOME EXERCISE PROGRAM:   Use compensatory memory strategies   GOALS:   WILL ESTABLISH GOALS AFTER RE-EVALUATION BY NEUROLOGIST   ASSESSMENT:  CLINICAL IMPRESSION: Patient is a 83 y.o. right handed female who was seen today for a cognitive communication evaluation d/t mild to moderate dementia.    Pt arrives to evauation with her significant other, Stan. Both report concerns over memory loss but most recently [place] voices concern over hallucinations that have been occuring for several months but are worsening in sevrity and occurance (3-4 times per week). Chart review indicates that he was instructed to take pt to ED for evaluation of hallucinations on 11/08/2023. Pt didn't go. She reports visual hallucinations of people (unknown to her) that take place primarily at night. Examples include someone in bed with them talking to [place], an "old laying laying on the den floor" and little children. He reports increased frustration and  anger towrads him when he attempts to redirect her during these hallucinations. She reports "He doesn't trust that I see what I am seeing." EXTENSIVE EDUCATION PROVIDED ON ALERTING HER NEUROLOGIST TO THESE SYMPTOMS AND ADVICE GIVEN TO CALL 911 WHEN PT IS HALLUCINATING. Both  voiced understanding.   Pt's memory loss is c/b deficits in both long-term and short-term memory. An example of long-term impairment was when she was not able to recall that her mother and Stan's mother were deceased. He reports that pt frequently doesn't recognize her surroundings as being her house and becomes confused thinking there is an upstairs when there isn't or there should doors that lead to other rooms that are present.   At this time, will defer skilled ST intervention until pt is further evaluated for the above concerns by neurology. both wer in agreement with this plan.     OBJECTIVE IMPAIRMENTS include attention, memory, awareness, and executive functioning. These impairments are limiting patient from managing medications, managing appointments, managing finances, household responsibilities, ADLs/IADLs, and effectively communicating at home and in community. Factors affecting potential to achieve goals and functional outcome are ability to learn/carryover information, co-morbidities, medical prognosis, and severity of impairments. Patient will benefit from skilled SLP services to address above impairments and improve overall function.  REHAB POTENTIAL: Poor d/t increased hallucinations  PLAN: WILL DEFER UNTIL AFTER RE-EVALUATION BY NEUROLOGY  Caeden Foots B. Garlin Junker, M.S., CCC-SLP, Tree surgeon Certified Brain Injury Specialist [facility]  [facility] Rehabilitation Services Office [phone] Ascom [phone] Fax [phone]

## 2023-11-15 ENCOUNTER — Encounter: Admitting: Speech Pathology

## 2023-11-17 ENCOUNTER — Encounter: Admitting: Speech Pathology

## 2023-11-21 ENCOUNTER — Other Ambulatory Visit: Payer: Self-pay | Admitting: Physician Assistant

## 2023-11-21 DIAGNOSIS — R441 Visual hallucinations: Secondary | ICD-10-CM

## 2023-11-21 DIAGNOSIS — R413 Other amnesia: Secondary | ICD-10-CM

## 2023-11-22 ENCOUNTER — Encounter: Admitting: Speech Pathology

## 2023-11-24 ENCOUNTER — Encounter: Admitting: Speech Pathology

## 2023-11-24 ENCOUNTER — Ambulatory Visit
Admission: RE | Admit: 2023-11-24 | Discharge: 2023-11-24 | Disposition: A | Discharge status: Home / Self Care | Source: Ambulatory Visit | Attending: Physician Assistant | Admitting: Physician Assistant

## 2023-11-24 DIAGNOSIS — R441 Visual hallucinations: Secondary | ICD-10-CM | POA: Insufficient documentation

## 2023-11-24 DIAGNOSIS — R413 Other amnesia: Secondary | ICD-10-CM | POA: Diagnosis present

## 2023-11-28 ENCOUNTER — Ambulatory Visit: Payer: Medicare Other | Admitting: Dermatology

## 2023-11-29 ENCOUNTER — Encounter: Admitting: Speech Pathology

## 2023-12-01 ENCOUNTER — Encounter: Admitting: Speech Pathology

## 2023-12-06 ENCOUNTER — Encounter: Admitting: Speech Pathology

## 2023-12-08 ENCOUNTER — Encounter: Admitting: Speech Pathology

## 2023-12-13 ENCOUNTER — Encounter: Admitting: Speech Pathology

## 2023-12-15 ENCOUNTER — Encounter: Admitting: Speech Pathology

## 2023-12-20 ENCOUNTER — Encounter: Admitting: Speech Pathology

## 2023-12-22 ENCOUNTER — Encounter: Admitting: Speech Pathology

## 2023-12-28 ENCOUNTER — Encounter: Admitting: Speech Pathology

## 2023-12-29 ENCOUNTER — Other Ambulatory Visit: Payer: Self-pay | Admitting: Family

## 2023-12-29 DIAGNOSIS — E7800 Pure hypercholesterolemia, unspecified: Secondary | ICD-10-CM

## 2023-12-30 ENCOUNTER — Encounter: Admitting: Speech Pathology

## 2024-01-04 ENCOUNTER — Encounter: Admitting: Speech Pathology

## 2024-01-06 ENCOUNTER — Encounter: Admitting: Speech Pathology

## 2024-01-09 ENCOUNTER — Encounter: Admitting: Speech Pathology

## 2024-01-11 ENCOUNTER — Encounter: Admitting: Speech Pathology

## 2024-01-16 ENCOUNTER — Encounter: Admitting: Speech Pathology

## 2024-01-18 ENCOUNTER — Encounter: Admitting: Speech Pathology

## 2024-01-23 ENCOUNTER — Encounter: Admitting: Speech Pathology

## 2024-01-25 ENCOUNTER — Encounter: Admitting: Speech Pathology

## 2024-01-30 ENCOUNTER — Encounter: Admitting: Speech Pathology

## 2024-02-01 ENCOUNTER — Encounter: Admitting: Speech Pathology

## 2024-02-06 ENCOUNTER — Encounter: Admitting: Speech Pathology

## 2024-02-08 ENCOUNTER — Encounter: Admitting: Speech Pathology

## 2024-02-13 ENCOUNTER — Encounter: Admitting: Speech Pathology

## 2024-02-15 ENCOUNTER — Encounter: Admitting: Speech Pathology

## 2024-02-20 ENCOUNTER — Encounter: Admitting: Speech Pathology

## 2024-02-22 ENCOUNTER — Encounter: Admitting: Speech Pathology

## 2024-02-27 ENCOUNTER — Encounter: Admitting: Speech Pathology

## 2024-02-29 ENCOUNTER — Encounter: Admitting: Speech Pathology

## 2024-03-05 ENCOUNTER — Encounter: Admitting: Speech Pathology

## 2024-03-05 ENCOUNTER — Other Ambulatory Visit: Payer: Self-pay | Admitting: Family

## 2024-03-05 DIAGNOSIS — R921 Mammographic calcification found on diagnostic imaging of breast: Secondary | ICD-10-CM

## 2024-03-07 ENCOUNTER — Encounter: Admitting: Speech Pathology

## 2024-04-11 ENCOUNTER — Ambulatory Visit
Admission: RE | Admit: 2024-04-11 | Discharge: 2024-04-11 | Disposition: A | Discharge status: Home / Self Care | Source: Ambulatory Visit | Attending: Family | Admitting: Family

## 2024-04-11 DIAGNOSIS — R921 Mammographic calcification found on diagnostic imaging of breast: Secondary | ICD-10-CM

## 2024-04-12 ENCOUNTER — Ambulatory Visit: Payer: Self-pay | Admitting: Family

## 2024-07-25 ENCOUNTER — Other Ambulatory Visit: Payer: Self-pay | Admitting: Family

## 2024-07-25 DIAGNOSIS — R928 Other abnormal and inconclusive findings on diagnostic imaging of breast: Secondary | ICD-10-CM

## 2024-07-25 DIAGNOSIS — R921 Mammographic calcification found on diagnostic imaging of breast: Secondary | ICD-10-CM

## 2024-07-25 DIAGNOSIS — Z1231 Encounter for screening mammogram for malignant neoplasm of breast: Secondary | ICD-10-CM

## 2024-09-07 ENCOUNTER — Encounter

## 2024-09-07 ENCOUNTER — Other Ambulatory Visit

## 2024-10-05 ENCOUNTER — Ambulatory Visit
